# Patient Record
Sex: Male | Born: 1955
Health system: Southern US, Community
[De-identification: ages and names within clinical notes are randomized; demographics above are authoritative.]

## PROBLEM LIST (undated history)

## (undated) ENCOUNTER — Emergency Department (HOSPITAL_BASED_OUTPATIENT_CLINIC_OR_DEPARTMENT_OTHER): Admission: EM | Discharge status: Absent without leave | Payer: Managed Care, Other (non HMO)

## (undated) DIAGNOSIS — N400 Enlarged prostate without lower urinary tract symptoms: Secondary | ICD-10-CM

## (undated) DIAGNOSIS — F329 Major depressive disorder, single episode, unspecified: Secondary | ICD-10-CM

## (undated) DIAGNOSIS — K648 Other hemorrhoids: Secondary | ICD-10-CM

## (undated) DIAGNOSIS — D126 Benign neoplasm of colon, unspecified: Secondary | ICD-10-CM

## (undated) DIAGNOSIS — E785 Hyperlipidemia, unspecified: Secondary | ICD-10-CM

## (undated) DIAGNOSIS — N529 Male erectile dysfunction, unspecified: Secondary | ICD-10-CM

## (undated) DIAGNOSIS — F32A Depression, unspecified: Secondary | ICD-10-CM

## (undated) DIAGNOSIS — I10 Essential (primary) hypertension: Secondary | ICD-10-CM

## (undated) HISTORY — DX: Other hemorrhoids: K64.8

## (undated) HISTORY — PX: APPENDECTOMY: SHX54

## (undated) HISTORY — DX: Major depressive disorder, single episode, unspecified: F32.9

## (undated) HISTORY — PX: INGUINAL HERNIA REPAIR: SHX194

## (undated) HISTORY — PX: ELBOW SURGERY: SHX618

## (undated) HISTORY — DX: Benign neoplasm of colon, unspecified: D12.6

## (undated) HISTORY — DX: Essential (primary) hypertension: I10

## (undated) HISTORY — DX: Hyperlipidemia, unspecified: E78.5

## (undated) HISTORY — DX: Male erectile dysfunction, unspecified: N52.9

## (undated) HISTORY — DX: Benign prostatic hyperplasia without lower urinary tract symptoms: N40.0

## (undated) HISTORY — PX: CARPAL TUNNEL RELEASE: SHX101

## (undated) HISTORY — DX: Depression, unspecified: F32.A

---

## 1997-10-29 ENCOUNTER — Other Ambulatory Visit: Admission: RE | Admit: 1997-10-29 | Discharge: 1997-10-29 | Payer: Self-pay | Admitting: Family Medicine

## 1997-11-12 ENCOUNTER — Other Ambulatory Visit: Admission: RE | Admit: 1997-11-12 | Discharge: 1997-11-12 | Payer: Self-pay | Admitting: Family Medicine

## 1998-04-08 ENCOUNTER — Emergency Department (HOSPITAL_COMMUNITY): Admission: EM | Admit: 1998-04-08 | Discharge: 1998-04-08 | Payer: Self-pay | Admitting: Emergency Medicine

## 1998-09-01 ENCOUNTER — Emergency Department (HOSPITAL_COMMUNITY): Admission: EM | Admit: 1998-09-01 | Discharge: 1998-09-01 | Payer: Self-pay | Admitting: Emergency Medicine

## 1999-02-27 ENCOUNTER — Emergency Department (HOSPITAL_COMMUNITY): Admission: EM | Admit: 1999-02-27 | Discharge: 1999-02-27 | Payer: Self-pay

## 1999-11-30 ENCOUNTER — Emergency Department (HOSPITAL_COMMUNITY): Admission: EM | Admit: 1999-11-30 | Discharge: 1999-11-30 | Payer: Self-pay | Admitting: Emergency Medicine

## 1999-11-30 ENCOUNTER — Encounter: Payer: Self-pay | Admitting: Emergency Medicine

## 2001-12-04 ENCOUNTER — Emergency Department (HOSPITAL_COMMUNITY): Admission: EM | Admit: 2001-12-04 | Discharge: 2001-12-05 | Payer: Self-pay | Admitting: Emergency Medicine

## 2002-07-07 ENCOUNTER — Emergency Department (HOSPITAL_COMMUNITY): Admission: EM | Admit: 2002-07-07 | Discharge: 2002-07-07 | Payer: Self-pay

## 2004-02-04 ENCOUNTER — Emergency Department (HOSPITAL_COMMUNITY): Admission: EM | Admit: 2004-02-04 | Discharge: 2004-02-04 | Payer: Self-pay | Admitting: Emergency Medicine

## 2004-09-17 ENCOUNTER — Emergency Department (HOSPITAL_COMMUNITY): Admission: EM | Admit: 2004-09-17 | Discharge: 2004-09-17 | Payer: Self-pay | Admitting: Emergency Medicine

## 2006-07-20 ENCOUNTER — Ambulatory Visit: Payer: Self-pay | Admitting: Family Medicine

## 2006-08-03 ENCOUNTER — Ambulatory Visit: Payer: Self-pay | Admitting: Family Medicine

## 2006-08-03 LAB — CONVERTED CEMR LAB
Albumin: 4.3 g/dL (ref 3.5–5.2)
Alkaline Phosphatase: 69 units/L (ref 39–117)
Basophils Absolute: 0.1 10*3/uL (ref 0.0–0.1)
CO2: 29 meq/L (ref 19–32)
Creatinine, Ser: 1.4 mg/dL (ref 0.4–1.5)
Eosinophils Absolute: 0.1 10*3/uL (ref 0.0–0.6)
GFR calc Af Amer: 69 mL/min
GFR calc non Af Amer: 57 mL/min
Glucose, Bld: 100 mg/dL — ABNORMAL HIGH (ref 70–99)
HDL: 60.2 mg/dL (ref >39.0)
MCHC: 34.3 g/dL (ref 30.0–36.0)
Monocytes Relative: 6.6 % (ref 3.0–11.0)
Neutro Abs: 3.9 10*3/uL (ref 1.4–7.7)
Neutrophils Relative %: 56.2 % (ref 43.0–77.0)
Platelets: 316 10*3/uL (ref 150–400)
RBC: 4.91 M/uL (ref 4.22–5.81)
RDW: 14.3 % (ref 11.5–14.6)
Sodium: 140 meq/L (ref 135–145)
Total Bilirubin: 1.3 mg/dL — ABNORMAL HIGH (ref 0.3–1.2)
Total Protein: 7.4 g/dL (ref 6.0–8.3)
Triglycerides: 431 mg/dL (ref 0–149)
VLDL: 86 mg/dL — ABNORMAL HIGH (ref 0–40)

## 2006-09-18 ENCOUNTER — Ambulatory Visit: Payer: Self-pay | Admitting: Family Medicine

## 2006-12-21 ENCOUNTER — Ambulatory Visit: Payer: Self-pay | Admitting: Family Medicine

## 2006-12-21 DIAGNOSIS — E785 Hyperlipidemia, unspecified: Secondary | ICD-10-CM | POA: Insufficient documentation

## 2006-12-21 DIAGNOSIS — K644 Residual hemorrhoidal skin tags: Secondary | ICD-10-CM | POA: Insufficient documentation

## 2006-12-26 ENCOUNTER — Encounter: Payer: Self-pay | Admitting: Family Medicine

## 2007-02-21 ENCOUNTER — Telehealth (INDEPENDENT_AMBULATORY_CARE_PROVIDER_SITE_OTHER): Payer: Self-pay | Admitting: *Deleted

## 2007-02-22 ENCOUNTER — Ambulatory Visit: Payer: Self-pay | Admitting: Family Medicine

## 2007-02-22 DIAGNOSIS — F329 Major depressive disorder, single episode, unspecified: Secondary | ICD-10-CM | POA: Insufficient documentation

## 2007-03-09 ENCOUNTER — Telehealth (INDEPENDENT_AMBULATORY_CARE_PROVIDER_SITE_OTHER): Payer: Self-pay | Admitting: *Deleted

## 2007-05-21 ENCOUNTER — Ambulatory Visit: Payer: Self-pay | Admitting: Internal Medicine

## 2007-05-21 DIAGNOSIS — R7989 Other specified abnormal findings of blood chemistry: Secondary | ICD-10-CM | POA: Insufficient documentation

## 2007-05-21 LAB — CONVERTED CEMR LAB: LDL Goal: 160 mg/dL

## 2007-06-06 ENCOUNTER — Telehealth (INDEPENDENT_AMBULATORY_CARE_PROVIDER_SITE_OTHER): Payer: Self-pay | Admitting: *Deleted

## 2007-08-03 ENCOUNTER — Ambulatory Visit: Payer: Self-pay | Admitting: Family Medicine

## 2007-08-03 DIAGNOSIS — F528 Other sexual dysfunction not due to a substance or known physiological condition: Secondary | ICD-10-CM

## 2007-08-06 ENCOUNTER — Encounter: Payer: Self-pay | Admitting: Family Medicine

## 2007-10-05 ENCOUNTER — Telehealth (INDEPENDENT_AMBULATORY_CARE_PROVIDER_SITE_OTHER): Payer: Self-pay | Admitting: *Deleted

## 2007-10-08 ENCOUNTER — Ambulatory Visit: Payer: Self-pay | Admitting: Family Medicine

## 2007-10-08 DIAGNOSIS — M25559 Pain in unspecified hip: Secondary | ICD-10-CM

## 2007-10-09 ENCOUNTER — Telehealth (INDEPENDENT_AMBULATORY_CARE_PROVIDER_SITE_OTHER): Payer: Self-pay | Admitting: *Deleted

## 2007-10-15 ENCOUNTER — Encounter: Admission: RE | Admit: 2007-10-15 | Discharge: 2007-10-15 | Payer: Self-pay | Admitting: Family Medicine

## 2007-10-15 ENCOUNTER — Encounter: Payer: Self-pay | Admitting: Family Medicine

## 2007-10-23 ENCOUNTER — Encounter (INDEPENDENT_AMBULATORY_CARE_PROVIDER_SITE_OTHER): Payer: Self-pay | Admitting: *Deleted

## 2007-10-26 ENCOUNTER — Telehealth (INDEPENDENT_AMBULATORY_CARE_PROVIDER_SITE_OTHER): Payer: Self-pay | Admitting: *Deleted

## 2007-10-27 ENCOUNTER — Encounter: Payer: Self-pay | Admitting: Family Medicine

## 2007-11-09 ENCOUNTER — Telehealth (INDEPENDENT_AMBULATORY_CARE_PROVIDER_SITE_OTHER): Payer: Self-pay | Admitting: *Deleted

## 2007-11-13 ENCOUNTER — Ambulatory Visit: Payer: Self-pay | Admitting: Family Medicine

## 2007-11-19 ENCOUNTER — Ambulatory Visit: Payer: Self-pay | Admitting: Internal Medicine

## 2007-11-19 DIAGNOSIS — J019 Acute sinusitis, unspecified: Secondary | ICD-10-CM

## 2007-11-26 ENCOUNTER — Encounter (INDEPENDENT_AMBULATORY_CARE_PROVIDER_SITE_OTHER): Payer: Self-pay | Admitting: *Deleted

## 2007-11-26 ENCOUNTER — Telehealth (INDEPENDENT_AMBULATORY_CARE_PROVIDER_SITE_OTHER): Payer: Self-pay | Admitting: *Deleted

## 2007-11-29 ENCOUNTER — Ambulatory Visit: Payer: Self-pay | Admitting: Internal Medicine

## 2007-12-18 ENCOUNTER — Encounter: Admission: RE | Admit: 2007-12-18 | Discharge: 2008-03-17 | Payer: Self-pay | Admitting: Family Medicine

## 2008-01-16 ENCOUNTER — Ambulatory Visit: Payer: Self-pay | Admitting: Family Medicine

## 2008-01-16 ENCOUNTER — Telehealth (INDEPENDENT_AMBULATORY_CARE_PROVIDER_SITE_OTHER): Payer: Self-pay | Admitting: *Deleted

## 2008-01-27 LAB — CONVERTED CEMR LAB
Albumin: 4.1 g/dL (ref 3.5–5.2)
BUN: 14 mg/dL (ref 6–23)
Calcium: 9.2 mg/dL (ref 8.4–10.5)
Direct LDL: 65.4 mg/dL
GFR calc Af Amer: 82 mL/min
Glucose, Bld: 94 mg/dL (ref 70–99)
HDL: 55.9 mg/dL (ref >39.0)
Total Protein: 7 g/dL (ref 6.0–8.3)
Triglycerides: 220 mg/dL (ref 0–149)

## 2008-01-28 ENCOUNTER — Encounter (INDEPENDENT_AMBULATORY_CARE_PROVIDER_SITE_OTHER): Payer: Self-pay | Admitting: *Deleted

## 2008-01-30 ENCOUNTER — Encounter: Payer: Self-pay | Admitting: Family Medicine

## 2008-02-04 ENCOUNTER — Encounter: Payer: Self-pay | Admitting: Family Medicine

## 2008-02-11 ENCOUNTER — Ambulatory Visit: Payer: Self-pay | Admitting: Family Medicine

## 2008-02-18 ENCOUNTER — Encounter (INDEPENDENT_AMBULATORY_CARE_PROVIDER_SITE_OTHER): Payer: Self-pay | Admitting: *Deleted

## 2008-02-18 LAB — CONVERTED CEMR LAB
AST: 23 units/L (ref 0–37)
Albumin: 4 g/dL (ref 3.5–5.2)
Alkaline Phosphatase: 36 units/L — ABNORMAL LOW (ref 39–117)
BUN: 13 mg/dL (ref 6–23)
Bilirubin, Direct: 0.1 mg/dL (ref 0.0–0.3)
Chloride: 110 meq/L (ref 96–112)
Cholesterol: 131 mg/dL (ref 0–200)
GFR calc non Af Amer: 62 mL/min
Glucose, Bld: 86 mg/dL (ref 70–99)
LDL Cholesterol: 52 mg/dL (ref 0–99)
Potassium: 3.8 meq/L (ref 3.5–5.1)

## 2008-03-31 ENCOUNTER — Ambulatory Visit: Payer: Self-pay | Admitting: Family Medicine

## 2008-03-31 ENCOUNTER — Encounter (INDEPENDENT_AMBULATORY_CARE_PROVIDER_SITE_OTHER): Payer: Self-pay | Admitting: *Deleted

## 2008-03-31 DIAGNOSIS — I10 Essential (primary) hypertension: Secondary | ICD-10-CM | POA: Insufficient documentation

## 2008-04-03 ENCOUNTER — Encounter (INDEPENDENT_AMBULATORY_CARE_PROVIDER_SITE_OTHER): Payer: Self-pay | Admitting: *Deleted

## 2008-04-15 ENCOUNTER — Ambulatory Visit: Payer: Self-pay | Admitting: Gastroenterology

## 2008-04-17 ENCOUNTER — Ambulatory Visit: Payer: Self-pay | Admitting: Cardiology

## 2008-06-19 ENCOUNTER — Ambulatory Visit: Payer: Self-pay | Admitting: Family Medicine

## 2008-06-26 ENCOUNTER — Telehealth (INDEPENDENT_AMBULATORY_CARE_PROVIDER_SITE_OTHER): Payer: Self-pay | Admitting: *Deleted

## 2008-08-12 ENCOUNTER — Ambulatory Visit: Payer: Self-pay | Admitting: Family Medicine

## 2008-09-30 ENCOUNTER — Ambulatory Visit: Payer: Self-pay | Admitting: Family Medicine

## 2008-10-22 ENCOUNTER — Ambulatory Visit: Payer: Self-pay | Admitting: Gastroenterology

## 2008-11-08 DIAGNOSIS — D126 Benign neoplasm of colon, unspecified: Secondary | ICD-10-CM

## 2008-11-08 HISTORY — DX: Benign neoplasm of colon, unspecified: D12.6

## 2008-11-13 ENCOUNTER — Telehealth (INDEPENDENT_AMBULATORY_CARE_PROVIDER_SITE_OTHER): Payer: Self-pay | Admitting: *Deleted

## 2008-11-17 ENCOUNTER — Ambulatory Visit: Payer: Self-pay | Admitting: Internal Medicine

## 2008-11-17 DIAGNOSIS — E7800 Pure hypercholesterolemia, unspecified: Secondary | ICD-10-CM | POA: Insufficient documentation

## 2008-11-20 LAB — CONVERTED CEMR LAB
ALT: 28 units/L (ref 0–53)
Cholesterol: 146 mg/dL (ref 0–200)
HDL: 66.4 mg/dL (ref >39.00)
Total Protein: 6.7 g/dL (ref 6.0–8.3)
VLDL: 21.6 mg/dL (ref 0.0–40.0)

## 2008-11-21 ENCOUNTER — Telehealth: Payer: Self-pay | Admitting: Gastroenterology

## 2008-11-25 ENCOUNTER — Ambulatory Visit: Payer: Self-pay | Admitting: Gastroenterology

## 2008-11-25 ENCOUNTER — Encounter: Payer: Self-pay | Admitting: Gastroenterology

## 2008-11-27 ENCOUNTER — Ambulatory Visit: Payer: Self-pay | Admitting: Internal Medicine

## 2008-11-27 ENCOUNTER — Encounter: Payer: Self-pay | Admitting: Gastroenterology

## 2008-12-04 ENCOUNTER — Ambulatory Visit: Payer: Self-pay | Admitting: Family Medicine

## 2008-12-04 ENCOUNTER — Encounter (INDEPENDENT_AMBULATORY_CARE_PROVIDER_SITE_OTHER): Payer: Self-pay | Admitting: *Deleted

## 2008-12-04 DIAGNOSIS — Z87898 Personal history of other specified conditions: Secondary | ICD-10-CM

## 2008-12-04 LAB — CONVERTED CEMR LAB
Bilirubin Urine: NEGATIVE
Glucose, Urine, Semiquant: NEGATIVE
PSA: 0.65 ng/mL (ref 0.10–4.00)
Urobilinogen, UA: NEGATIVE
pH: 6.5

## 2009-01-07 ENCOUNTER — Telehealth (INDEPENDENT_AMBULATORY_CARE_PROVIDER_SITE_OTHER): Payer: Self-pay | Admitting: *Deleted

## 2009-02-04 ENCOUNTER — Telehealth (INDEPENDENT_AMBULATORY_CARE_PROVIDER_SITE_OTHER): Payer: Self-pay | Admitting: *Deleted

## 2009-02-09 ENCOUNTER — Emergency Department (HOSPITAL_BASED_OUTPATIENT_CLINIC_OR_DEPARTMENT_OTHER): Admission: EM | Admit: 2009-02-09 | Discharge: 2009-02-10 | Payer: Self-pay | Admitting: Emergency Medicine

## 2009-02-10 ENCOUNTER — Ambulatory Visit: Payer: Self-pay | Admitting: Family Medicine

## 2009-02-10 DIAGNOSIS — R5381 Other malaise: Secondary | ICD-10-CM

## 2009-02-10 DIAGNOSIS — R5383 Other fatigue: Secondary | ICD-10-CM

## 2009-02-10 DIAGNOSIS — J301 Allergic rhinitis due to pollen: Secondary | ICD-10-CM | POA: Insufficient documentation

## 2009-02-11 ENCOUNTER — Encounter (INDEPENDENT_AMBULATORY_CARE_PROVIDER_SITE_OTHER): Payer: Self-pay | Admitting: *Deleted

## 2009-03-20 ENCOUNTER — Encounter: Payer: Self-pay | Admitting: Family Medicine

## 2009-04-06 ENCOUNTER — Ambulatory Visit: Payer: Self-pay | Admitting: Family Medicine

## 2009-04-06 DIAGNOSIS — R259 Unspecified abnormal involuntary movements: Secondary | ICD-10-CM | POA: Insufficient documentation

## 2009-04-13 ENCOUNTER — Encounter (INDEPENDENT_AMBULATORY_CARE_PROVIDER_SITE_OTHER): Payer: Self-pay | Admitting: *Deleted

## 2009-06-15 ENCOUNTER — Ambulatory Visit: Payer: Self-pay | Admitting: Family Medicine

## 2009-07-25 ENCOUNTER — Ambulatory Visit: Payer: Self-pay | Admitting: Diagnostic Radiology

## 2009-07-25 ENCOUNTER — Emergency Department (HOSPITAL_BASED_OUTPATIENT_CLINIC_OR_DEPARTMENT_OTHER): Admission: EM | Admit: 2009-07-25 | Discharge: 2009-07-25 | Payer: Self-pay | Admitting: Emergency Medicine

## 2009-08-27 ENCOUNTER — Ambulatory Visit: Payer: Self-pay | Admitting: Internal Medicine

## 2009-08-27 DIAGNOSIS — R61 Generalized hyperhidrosis: Secondary | ICD-10-CM

## 2009-08-27 DIAGNOSIS — I951 Orthostatic hypotension: Secondary | ICD-10-CM

## 2009-11-26 ENCOUNTER — Ambulatory Visit: Payer: Self-pay | Admitting: Family Medicine

## 2009-12-01 ENCOUNTER — Ambulatory Visit: Payer: Self-pay | Admitting: Diagnostic Radiology

## 2009-12-01 ENCOUNTER — Emergency Department (HOSPITAL_BASED_OUTPATIENT_CLINIC_OR_DEPARTMENT_OTHER): Admission: EM | Admit: 2009-12-01 | Discharge: 2009-12-01 | Payer: Self-pay | Admitting: Emergency Medicine

## 2010-01-05 ENCOUNTER — Emergency Department (HOSPITAL_BASED_OUTPATIENT_CLINIC_OR_DEPARTMENT_OTHER): Admission: EM | Admit: 2010-01-05 | Discharge: 2010-01-05 | Payer: Self-pay | Admitting: Emergency Medicine

## 2010-01-06 ENCOUNTER — Ambulatory Visit: Payer: Self-pay | Admitting: Family Medicine

## 2010-01-25 ENCOUNTER — Ambulatory Visit: Payer: Self-pay | Admitting: Family Medicine

## 2010-01-25 ENCOUNTER — Ambulatory Visit (HOSPITAL_BASED_OUTPATIENT_CLINIC_OR_DEPARTMENT_OTHER): Admission: RE | Admit: 2010-01-25 | Discharge: 2010-01-25 | Payer: Self-pay | Admitting: Family Medicine

## 2010-01-25 ENCOUNTER — Ambulatory Visit: Payer: Self-pay | Admitting: Diagnostic Radiology

## 2010-01-25 DIAGNOSIS — R079 Chest pain, unspecified: Secondary | ICD-10-CM | POA: Insufficient documentation

## 2010-01-25 DIAGNOSIS — R05 Cough: Secondary | ICD-10-CM | POA: Insufficient documentation

## 2010-01-25 DIAGNOSIS — R059 Cough, unspecified: Secondary | ICD-10-CM | POA: Insufficient documentation

## 2010-02-03 ENCOUNTER — Ambulatory Visit: Payer: Self-pay | Admitting: Family Medicine

## 2010-02-09 ENCOUNTER — Telehealth: Payer: Self-pay | Admitting: Family Medicine

## 2010-02-11 ENCOUNTER — Telehealth: Payer: Self-pay | Admitting: Family Medicine

## 2010-02-11 LAB — CONVERTED CEMR LAB
Alkaline Phosphatase: 36 units/L — ABNORMAL LOW (ref 39–117)
Bilirubin, Direct: 0.1 mg/dL (ref 0.0–0.3)
Total Protein: 7.2 g/dL (ref 6.0–8.3)
VLDL: 55.6 mg/dL — ABNORMAL HIGH (ref 0.0–40.0)

## 2010-02-15 ENCOUNTER — Ambulatory Visit: Payer: Self-pay | Admitting: Cardiovascular Disease

## 2010-04-16 ENCOUNTER — Ambulatory Visit: Payer: Self-pay | Admitting: Family Medicine

## 2010-05-07 ENCOUNTER — Ambulatory Visit: Payer: Self-pay | Admitting: Family Medicine

## 2010-05-07 LAB — CONVERTED CEMR LAB: Influenza B Ag: NEGATIVE

## 2010-07-02 ENCOUNTER — Ambulatory Visit: Payer: Self-pay | Admitting: Family Medicine

## 2010-07-13 ENCOUNTER — Ambulatory Visit
Admission: RE | Admit: 2010-07-13 | Discharge: 2010-07-13 | Payer: Self-pay | Source: Home / Self Care | Attending: Family Medicine | Admitting: Family Medicine

## 2010-07-19 ENCOUNTER — Ambulatory Visit
Admission: RE | Admit: 2010-07-19 | Discharge: 2010-07-19 | Payer: Self-pay | Source: Home / Self Care | Attending: Family Medicine | Admitting: Family Medicine

## 2010-08-08 LAB — CONVERTED CEMR LAB
ALT: 32 units/L (ref 0–53)
AST: 23 units/L (ref 0–37)
Albumin: 3.9 g/dL (ref 3.5–5.2)
Alkaline Phosphatase: 40 units/L (ref 39–117)
Alkaline Phosphatase: 59 units/L (ref 39–117)
Basophils Absolute: 0 10*3/uL (ref 0.0–0.1)
Bilirubin, Direct: 0.1 mg/dL (ref 0.0–0.3)
Bilirubin, Direct: 0.1 mg/dL (ref 0.0–0.3)
Bilirubin, Direct: 0.1 mg/dL (ref 0.0–0.3)
CO2: 27 meq/L (ref 19–32)
Chloride: 106 meq/L (ref 96–112)
Creatinine, Ser: 1.2 mg/dL (ref 0.4–1.5)
GFR calc non Af Amer: 68 mL/min
Glucose, Bld: 104 mg/dL — ABNORMAL HIGH (ref 70–99)
Glucose, Bld: 86 mg/dL (ref 70–99)
HDL: 58.5 mg/dL (ref >39.0)
Ketones, urine, test strip: NEGATIVE
LDL Cholesterol: 74 mg/dL (ref 0–99)
Lymphocytes Relative: 42 % (ref 12.0–46.0)
MCHC: 34.2 g/dL (ref 30.0–36.0)
Monocytes Relative: 8.9 % (ref 3.0–12.0)
Neutrophils Relative %: 46.5 % (ref 43.0–77.0)
Nitrite: NEGATIVE
PSA: 0.76 ng/mL (ref 0.10–4.00)
Platelets: 285 10*3/uL (ref 150–400)
Potassium: 3.5 meq/L (ref 3.5–5.1)
Protein, U semiquant: NEGATIVE
RDW: 13.9 % (ref 11.5–14.6)
Sodium: 141 meq/L (ref 135–145)
Sodium: 142 meq/L (ref 135–145)
TSH: 1.91 microintl units/mL (ref 0.35–5.50)
Total Bilirubin: 0.8 mg/dL (ref 0.3–1.2)
Total Bilirubin: 0.9 mg/dL (ref 0.3–1.2)
Total Bilirubin: 1.3 mg/dL — ABNORMAL HIGH (ref 0.3–1.2)
Total CHOL/HDL Ratio: 2.5
Total Protein: 6.8 g/dL (ref 6.0–8.3)
Total Protein: 7.1 g/dL (ref 6.0–8.3)
Total Protein: 7.2 g/dL (ref 6.0–8.3)
Triglycerides: 73 mg/dL (ref 0–149)
Urobilinogen, UA: NEGATIVE
VLDL: 15 mg/dL (ref 0–40)

## 2010-08-10 NOTE — Assessment & Plan Note (Signed)
Summary: FOR SINUS//PH   Vital Signs:  Patient profile:   55 year old male Weight:      173.8 pounds Temp:     97.2 degrees F oral Pulse rate:   76 / minute Pulse rhythm:   regular BP sitting:   140 / 88  (left arm) Cuff size:   regular  Vitals Entered By: Almeta Monas CMA Duncan Dull) (April 16, 2010 3:33 PM) CC: c/o severe sinus Headache, low grade fever, post nasal drainage and congestion, URI symptoms   History of Present Illness:       This is a 55 year old man who presents with URI symptoms.  The symptoms began 1 week ago.  Pt using otc meds with no relief.   + b/l sinus headache and pressure.  The patient complains of nasal congestion, purulent nasal discharge, earache, and sick contacts, but denies dry cough.  The patient denies fever, low-grade fever (<100.5 degrees), fever of 100.5-103 degrees, fever of 103.1-104 degrees, fever to >104 degrees, stiff neck, dyspnea, wheezing, rash, vomiting, diarrhea, use of an antipyretic, and response to antipyretic.  The patient also reports headache.  The patient denies itchy watery eyes, itchy throat, sneezing, seasonal symptoms, response to antihistamine, muscle aches, and severe fatigue.  The patient denies the following risk factors for Strep sinusitis: unilateral facial pain, unilateral nasal discharge, poor response to decongestant, double sickening, tooth pain, Strep exposure, tender adenopathy, and absence of cough.    Preventive Screening-Counseling & Management  Alcohol-Tobacco     Alcohol drinks/day: 2     Alcohol type: beer     Smoking Status: never     Passive Smoke Exposure: no  Caffeine-Diet-Exercise     Caffeine use/day: 1     Does Patient Exercise: no  Current Medications (verified): 1)  Viagra 100 Mg Tabs (Sildenafil Citrate) .... As Directed 2)  Crestor 20 Mg Tabs (Rosuvastatin Calcium) .Marland Kitchen.. 1 By Mouth Once Daily 3)  Prinzide 20-12.5 Mg Tabs (Lisinopril-Hydrochlorothiazide) .Marland Kitchen.. 1 By Mouth Once Daily. 4)  Claritin 10  Mg Tabs (Loratadine) .Marland Kitchen.. 1 By Mouth Once Daily As Needed Allergies 5)  Fenofibrate 160 Mg Tabs (Fenofibrate) .... Take One Tablet By Mouth Daily With A Meal 6)  Biaxin Xl Pac 500 Mg Xr24h-Tab (Clarithromycin) .... 2 By Mouth Once Daily 7)  Veramyst 27.5 Mcg/spray Susp (Fluticasone Furoate) .... 2 Sprays Each Nostril Once Daily  Allergies (verified): 1)  ! Pcn  Past History:  Past Medical History: Last updated: 03/31/2008 Depression Hyperlipidemia Erectile Dysfunction Hypertension  Past Surgical History: Last updated: 03/31/2008 Appendectomy Inguinal herniorrhaphy elbow  sugery  wrist surgery  Family History: Last updated: 04/06/2009 Family History of CAD Male 1st degree relative 56yo Family History of CAD Male 1st degree relative 56yo F--alz dementia Family H parkinsons   Social History: Last updated: 11/27/2008 Married Retired Never Smoked Alcohol use-yes Regular exercise-no  Risk Factors: Alcohol Use: 2 (04/16/2010) Caffeine Use: 1 (04/16/2010) Exercise: no (04/16/2010)  Risk Factors: Smoking Status: never (04/16/2010) Passive Smoke Exposure: no (04/16/2010)  Family History: Reviewed history from 04/06/2009 and no changes required. Family History of CAD Male 1st degree relative 56yo Family History of CAD Male 1st degree relative 56yo F--alz dementia Family H parkinsons   Social History: Reviewed history from 11/27/2008 and no changes required. Married Retired Never Smoked Alcohol use-yes Regular exercise-no  Review of Systems      See HPI  Physical Exam  General:  Well-developed,well-nourished,in no acute distress; alert,appropriate and cooperative throughout examination Ears:  External ear exam shows no significant lesions or deformities.  Otoscopic examination reveals clear canals, tympanic membranes are intact bilaterally without bulging, retraction, inflammation or discharge. Hearing is grossly normal bilaterally. Nose:  L frontal sinus  tenderness, L maxillary sinus tenderness, R frontal sinus tenderness, and R maxillary sinus tenderness.   Mouth:  pharyngeal erythema and postnasal drip.   Neck:  supple and cervical lymphadenopathy.   Lungs:  Normal respiratory effort, chest expands symmetrically. Lungs are clear to auscultation, no crackles or wheezes. Heart:  Normal rate and regular rhythm. S1 and S2 normal without gallop, murmur, click, rub or other extra sounds. Skin:  Intact without suspicious lesions or rashes Psych:  Cognition and judgment appear intact. Alert and cooperative with normal attention span and concentration. No apparent delusions, illusions, hallucinations   Impression & Recommendations:  Problem # 1:  SINUSITIS - ACUTE-NOS (ICD-461.9)  Instructed on treatment. Call if symptoms persist or worsen.   His updated medication list for this problem includes:    Biaxin Xl Pac 500 Mg Xr24h-tab (Clarithromycin) .Marland Kitchen... 2 by mouth once daily    Veramyst 27.5 Mcg/spray Susp (Fluticasone furoate) .Marland Kitchen... 2 sprays each nostril once daily  Complete Medication List: 1)  Viagra 100 Mg Tabs (Sildenafil citrate) .... As directed 2)  Crestor 20 Mg Tabs (Rosuvastatin calcium) .Marland Kitchen.. 1 by mouth once daily 3)  Prinzide 20-12.5 Mg Tabs (Lisinopril-hydrochlorothiazide) .Marland Kitchen.. 1 by mouth once daily. 4)  Claritin 10 Mg Tabs (Loratadine) .Marland Kitchen.. 1 by mouth once daily as needed allergies 5)  Fenofibrate 160 Mg Tabs (Fenofibrate) .... Take one tablet by mouth daily with a meal 6)  Biaxin Xl Pac 500 Mg Xr24h-tab (Clarithromycin) .... 2 by mouth once daily 7)  Veramyst 27.5 Mcg/spray Susp (Fluticasone furoate) .... 2 sprays each nostril once daily  Patient Instructions: 1)  Acute sinusitis symptoms for less than 10 days are not helped by antibiotics. Use warm moist compresses, and over the counter decongestants( only as directed). Call if no improvement in 5-7 days, sooner if increasing pain, fever, or new symptoms.   Prescriptions: VERAMYST 27.5 MCG/SPRAY SUSP (FLUTICASONE FUROATE) 2 sprays each nostril once daily  #1 x 0   Entered and Authorized by:   Loreen Freud DO   Signed by:   Loreen Freud DO on 04/16/2010   Method used:   Historical   RxID:   [id number] BIAXIN XL PAC 500 MG XR24H-TAB (CLARITHROMYCIN) 2 by mouth once daily  #28 x 0   Entered and Authorized by:   Loreen Freud DO   Signed by:   Loreen Freud DO on 04/16/2010   Method used:   Electronically to        [facility] [address] (retail)       [phone]       [place], [place]  [address]       Ph: [phone]       Fax: [phone]   RxID:   [id number]

## 2010-08-10 NOTE — Assessment & Plan Note (Signed)
Summary: tightness in chest when coughing/cbs   Vital Signs:  Patient profile:   55 year old male Height:      71.5 inches Weight:      187 pounds O2 Sat:      99 % on Room air Temp:     98.3 degrees F oral Pulse rate:   65 / minute BP sitting:   120 / 80  (left arm)  Vitals Entered By: Jeremy Johann CMA (January 25, 2010 1:01 PM)  O2 Flow:  Room air CC: tighness in chest when coughing, Cough   History of Present Illness:       This is a 55 year old man who presents with Chest Pain.  The symptoms began 2 days ago.  Chest pain only with coughing.  The patient reports exertional chest pain, but denies resting chest pain, nausea, vomiting, diaphoresis, shortness of breath, palpitations, dizziness, light headedness, syncope, and indigestion.  The pain is described as intermittent and band-like.  The pain is located in the substernal area.  The pain is brought on or made worse by coughing.    Cough      The patient also presents with Cough.  The symptoms began 3 days ago.  The patient reports non-productive cough, but denies productive cough, pleuritic chest pain, shortness of breath, wheezing, exertional dyspnea, fever, hemoptysis, and malaise.  Associated symtpoms include cold/URI symptoms and chronic rhinitis.  The patient denies the following symptoms: sore throat, nasal congestion, weight loss, acid reflux symptoms, and peripheral edema.    Anticoagulation Management History:      Negative risk factors for bleeding include an age less than 46 years old and no history of CVA/TIA.  The bleeding index is 'low risk'.  Positive CHADS2 values include History of HTN.  Negative CHADS2 values include Age > 36 years old, History of Diabetes, and Prior Stroke/CVA/TIA.     Current Medications (verified): 1)  Viagra 100 Mg Tabs (Sildenafil Citrate) .... As Directed 2)  Trilipix 135 Mg Cpdr (Choline Fenofibrate) .Marland Kitchen.. 1 By Mouth Once Daily 3)  Crestor 20 Mg Tabs (Rosuvastatin Calcium) .Marland Kitchen.. 1 By  Mouth Once Daily 4)  Prinzide 20-12.5 Mg Tabs (Lisinopril-Hydrochlorothiazide) .Marland Kitchen.. 1 By Mouth Once Daily. 5)  Zithromax Z-Pak 250 Mg Tabs (Azithromycin) .... As Directed 6)  Claritin 10 Mg Tabs (Loratadine) .Marland Kitchen.. 1 By Mouth Once Daily As Needed Allergies  Allergies (verified): 1)  ! Pcn  Past History:  Past medical, surgical, family and social histories (including risk factors) reviewed for relevance to current acute and chronic problems.  Past Medical History: Reviewed history from 03/31/2008 and no changes required. Depression Hyperlipidemia Erectile Dysfunction Hypertension  Past Surgical History: Reviewed history from 03/31/2008 and no changes required. Appendectomy Inguinal herniorrhaphy elbow  sugery  wrist surgery  Family History: Reviewed history from 04/06/2009 and no changes required. Family History of CAD Male 1st degree relative 56yo Family History of CAD Male 1st degree relative 56yo F--alz dementia Family H parkinsons   Social History: Reviewed history from 11/27/2008 and no changes required. Married Retired Never Smoked Alcohol use-yes Regular exercise-no  Review of Systems      See HPI  Physical Exam  General:  Well-developed,well-nourished,in no acute distress; alert,appropriate and cooperative throughout examination Ears:  External ear exam shows no significant lesions or deformities.  Otoscopic examination reveals clear canals, tympanic membranes are intact bilaterally without bulging, retraction, inflammation or discharge. Hearing is grossly normal bilaterally. Nose:  External nasal examination shows no deformity or inflammation.  Nasal mucosa are pink and moist without lesions or exudates. Mouth:  Oral mucosa and oropharynx without lesions or exudates.  Teeth in good repair. Neck:  No deformities, masses, or tenderness noted. Lungs:  Normal respiratory effort, chest expands symmetrically. Lungs are clear to auscultation, no crackles or  wheezes. Heart:  normal rate and no murmur.   Psych:  Cognition and judgment appear intact. Alert and cooperative with normal attention span and concentration. No apparent delusions, illusions, hallucinations   Impression & Recommendations:  Problem # 1:  BRONCHITIS- ACUTE (ICD-466.0)  His updated medication list for this problem includes:    Zithromax Z-pak 250 Mg Tabs (Azithromycin) .Marland Kitchen... As directed  Take antibiotics and other medications as directed. Encouraged to push clear liquids, get enough rest, and take acetaminophen as needed. To be seen in 5-7 days if no improvement, sooner if worse.  Complete Medication List: 1)  Viagra 100 Mg Tabs (Sildenafil citrate) .... As directed 2)  Trilipix 135 Mg Cpdr (Choline fenofibrate) .Marland Kitchen.. 1 by mouth once daily 3)  Crestor 20 Mg Tabs (Rosuvastatin calcium) .Marland Kitchen.. 1 by mouth once daily 4)  Prinzide 20-12.5 Mg Tabs (Lisinopril-hydrochlorothiazide) .Marland Kitchen.. 1 by mouth once daily. 5)  Zithromax Z-pak 250 Mg Tabs (Azithromycin) .... As directed 6)  Claritin 10 Mg Tabs (Loratadine) .Marland Kitchen.. 1 by mouth once daily as needed allergies  Other Orders: EKG w/ Interpretation (93000) T-2 View CXR (71020TC)  Anticoagulation Management Assessment/Plan:            Prescriptions: CLARITIN 10 MG TABS (LORATADINE) 1 by mouth once daily as needed allergies  #30 x 11   Entered and Authorized by:   Loreen Freud DO   Signed by:   Loreen Freud DO on 01/25/2010   Method used:   Electronically to        [facility] [address] (retail)       [phone]       [place], [place]  [address]       Ph: [phone]       Fax: [phone]   RxID:   [id number] ZITHROMAX Z-PAK 250 MG TABS (AZITHROMYCIN) as directed  #1 x 0   Entered and Authorized by:   Loreen Freud DO   Signed by:   Loreen Freud DO on 01/25/2010   Method used:   Electronically to        [facility] [address] (retail)       [phone]        [place], [place]  [address]       Ph: [phone]       Fax: [phone]   RxID:   [id number] CLARITIN 10 MG TABS (LORATADINE) 1 by mouth once daily as needed allergies  #30 x 11   Entered and Authorized by:   Loreen Freud DO   Signed by:   Loreen Freud DO on 01/25/2010   Method used:   Electronically to        [facility] [phone][facility] (retail)       [address]       [place], [place]  [address]       Ph: [phone]       Fax: [phone]   RxID:   [id number] ZITHROMAX Z-PAK 250 MG TABS (AZITHROMYCIN) as directed  #1 x 0   Entered and Authorized by:  Loreen Freud DO   Signed by:   Loreen Freud DO on 01/25/2010   Method used:   [facility] to        [facility] [phone]* (retail)       [address]       [place], [place]  [address]       Ph: [phone]       Fax: [phone]   RxID:   [id number]    EKG  Procedure date:  01/25/2010  Findings:      Normal sinus rhythm with rate of:  61 bpm

## 2010-08-10 NOTE — Progress Notes (Signed)
Summary: Antara pharmacy correction  Phone Note Call from Patient   Summary of Call: [facility]ara was sent to wrong pharmacy, per patient request other pharmacies were removed. He only uses [facility] [address]. Initial call taken by: Lucious Groves CMA,  February 11, 2010 10:17 AM    Prescriptions: Lestine Mount 130 MG CAPS (FENOFIBRATE MICRONIZED) 1 by mouth qd  #30 x 2   Entered by:   Lucious Groves CMA   Authorized by:   Loreen Freud DO   Signed by:   Lucious Groves CMA on 02/11/2010   Method used:   Electronically to        [facility] [address] (retail)       [phone]       [place], [place]  [address]       Ph: [phone]       Fax: [phone]   RxID:   [id number]

## 2010-08-10 NOTE — Assessment & Plan Note (Signed)
Summary: STOMACH CRAMPS//KN   Vital Signs:  Patient profile:   55 year old male Weight:      188 pounds Temp:     97.8 degrees F oral BP sitting:   112 / 72  (left arm)  Vitals Entered By: Doristine Devoid (January 06, 2010 2:34 PM) CC: diarrhea and stomach x2 days    History of Present Illness: 55 yo man here today for diarrhea and abd cramping x2 days.  subjective fevers- alternating cold/hot chills.  stools are watery, yellow.  no blood.  no vomiting.  decreased appetite, drinking plenty of fluids.  taking immodium w/ some relief.  sxs are improved today.  only 2 stools today, yesterday was much more frequent.  no known sick contacts.  Current Medications (verified): 1)  Viagra 100 Mg Tabs (Sildenafil Citrate) .... As Directed 2)  Trilipix 135 Mg Cpdr (Choline Fenofibrate) .Marland Kitchen.. 1 By Mouth Once Daily 3)  Crestor 20 Mg Tabs (Rosuvastatin Calcium) .Marland Kitchen.. 1 By Mouth Once Daily 4)  Prinzide 20-12.5 Mg Tabs (Lisinopril-Hydrochlorothiazide) .Marland Kitchen.. 1 By Mouth Once Daily.  Allergies (verified): 1)  ! Pcn  Past History:  Past Medical History: Last updated: 03/31/2008 Depression Hyperlipidemia Erectile Dysfunction Hypertension  Review of Systems      See HPI  Physical Exam  General:  Well-developed,well-nourished,in no acute distress; alert,appropriate and cooperative throughout examination Mouth:  MMM Lungs:  Normal respiratory effort, chest expands symmetrically. Lungs are clear to auscultation, no crackles or wheezes. Heart:  Normal rate and regular rhythm. S1 and S2 normal without gallop, murmur, click, rub or other extra sounds. Abdomen:  soft, NT/ND, +BS, no rebound/guarding   Impression & Recommendations:  Problem # 1:  DIARRHEA (ICD-787.91) Assessment New likely viral given improvement in sxs.  no red flags on hx or PE.  continue immodium as needed.  reviewed supportive care and red flags that should prompt return.  Pt expresses understanding and is in agreement w/ this  plan.  Complete Medication List: 1)  Viagra 100 Mg Tabs (Sildenafil citrate) .... As directed 2)  Trilipix 135 Mg Cpdr (Choline fenofibrate) .Marland Kitchen.. 1 by mouth once daily 3)  Crestor 20 Mg Tabs (Rosuvastatin calcium) .Marland Kitchen.. 1 by mouth once daily 4)  Prinzide 20-12.5 Mg Tabs (Lisinopril-hydrochlorothiazide) .Marland Kitchen.. 1 by mouth once daily.  Patient Instructions: 1)  This is likely viral and should improve w/ time 2)  Continue to drink plenty of fluids 3)  Immodium as needed for diarrhea 4)  Advance diet as tolerated 5)  Call with any questions or concerns 6)  Hang in there!!

## 2010-08-10 NOTE — Progress Notes (Signed)
Summary: Lestine Mount rx  Phone Note Outgoing Call   Summary of Call: Antara prescription. Patient already aware.  Initial call taken by: Lucious Groves CMA,  February 09, 2010 11:10 AM    New/Updated Medications: ANTARA 130 MG CAPS (FENOFIBRATE MICRONIZED) 1 by mouth qd Prescriptions: ANTARA 130 MG CAPS (FENOFIBRATE MICRONIZED) 1 by mouth qd  #30 x 2   Entered by:   Lucious Groves CMA   Authorized by:   Loreen Freud DO   Signed by:   Lucious Groves CMA on 02/09/2010   Method used:   [facility]ron[facility] to        [facility] [phone]* (retail)       [address]       [place], [place]  [address]       Ph: [phone]       Fax: [phone]   RxID:   [id number]

## 2010-08-10 NOTE — Assessment & Plan Note (Signed)
Summary: FLU SYMPTOMS/KN   Vital Signs:  Patient profile:   55 year old male Weight:      192 pounds Temp:     97.2 degrees F oral BP sitting:   124 / 80  (left arm)  Vitals Entered By: Doristine Devoid CMA (May 07, 2010 4:29 PM) CC: sinus pressure and flu like sx    History of Present Illness: 55 yo man here today for sinus pressure.  'i feel terrible'.  had sinus infxn 10/7.  sxs improved w/ abx.  few days ago developed muscle aches, fatigue, night sweats.  subjective fevers.  having some sinus congestion at night.  denies facial pain or pressure but teeth have been hurting.  no ear pain, sore throat.  mild cough.  denies abd pain, N/V/D.  hx of recurrent sinus infxn.  Current Medications (verified): 1)  Viagra 100 Mg Tabs (Sildenafil Citrate) .... As Directed 2)  Crestor 20 Mg Tabs (Rosuvastatin Calcium) .Marland Kitchen.. 1 By Mouth Once Daily 3)  Prinzide 20-12.5 Mg Tabs (Lisinopril-Hydrochlorothiazide) .Marland Kitchen.. 1 By Mouth Once Daily. 4)  Claritin 10 Mg Tabs (Loratadine) .Marland Kitchen.. 1 By Mouth Once Daily As Needed Allergies 5)  Fenofibrate 160 Mg Tabs (Fenofibrate) .... Take One Tablet By Mouth Daily With A Meal 6)  Veramyst 27.5 Mcg/spray Susp (Fluticasone Furoate) .... 2 Sprays Each Nostril Once Daily  Allergies (verified): 1)  ! Pcn  Past History:  Past Medical History: Depression Hyperlipidemia Erectile Dysfunction Hypertension recurrent sinus infxns  Review of Systems      See HPI  Physical Exam  General:  Well-developed,well-nourished,in no acute distress; alert,appropriate and cooperative throughout examination Head:  NCAT, + TTP over frontal and maxillary sinuses Eyes:  no injxn or inflammation Ears:  External ear exam shows no significant lesions or deformities.  Otoscopic examination reveals clear canals, tympanic membranes are intact bilaterally without bulging, retraction, inflammation or discharge. Hearing is grossly normal bilaterally. Nose:  + congestion Mouth:  pharyngeal  erythema and postnasal drip.   Neck:  supple and cervical lymphadenopathy.   Lungs:  Normal respiratory effort, chest expands symmetrically. Lungs are clear to auscultation, no crackles or wheezes. Heart:  Normal rate and regular rhythm. S1 and S2 normal without gallop, murmur, click, rub or other extra sounds.   Impression & Recommendations:  Problem # 1:  SINUSITIS - ACUTE-NOS (ICD-461.9) Assessment Unchanged pt w/ recurrent sinus infxn.  given recent use of macrolide abx will switch to Doxy (PCN allergic).  reviewed supportive care and red flags that should prompt return.  Pt expresses understanding and is in agreement w/ this plan. His updated medication list for this problem includes:    Veramyst 27.5 Mcg/spray Susp (Fluticasone furoate) .Marland Kitchen... 2 sprays each nostril once daily    Doxycycline Hyclate 100 Mg Caps (Doxycycline hyclate) .Marland Kitchen... Take 1 tab twice a day.  take w/ food.  Complete Medication List: 1)  Viagra 100 Mg Tabs (Sildenafil citrate) .... As directed 2)  Crestor 20 Mg Tabs (Rosuvastatin calcium) .Marland Kitchen.. 1 by mouth once daily 3)  Prinzide 20-12.5 Mg Tabs (Lisinopril-hydrochlorothiazide) .Marland Kitchen.. 1 by mouth once daily. 4)  Claritin 10 Mg Tabs (Loratadine) .Marland Kitchen.. 1 by mouth once daily as needed allergies 5)  Fenofibrate 160 Mg Tabs (Fenofibrate) .... Take one tablet by mouth daily with a meal 6)  Veramyst 27.5 Mcg/spray Susp (Fluticasone furoate) .... 2 sprays each nostril once daily 7)  Doxycycline Hyclate 100 Mg Caps (Doxycycline hyclate) .... Take 1 tab twice a day.  take w/ food.  Other Orders: Flu A+B (42595)  Patient Instructions: 1)  This is likely a recurrent sinus infection 2)  Take the Doxycycline as directed- take w/ food to avoid upset stomach 3)  Drink plenty of fluids 4)  Continue the Claritin 5)  Add mucinex to thin your congestion 6)  Hang in there! 7)  Try and have a good weekend! Prescriptions: DOXYCYCLINE HYCLATE 100 MG CAPS (DOXYCYCLINE HYCLATE) Take 1 tab  twice a day.  take w/ food.  #20 x 0   Entered and Authorized by:   Neena Rhymes MD   Signed by:   Neena Rhymes MD on 05/07/2010   Method used:   Electronically to        [facility] [address] (retail)       [phone]       [place], [place]  [address]       Ph: [phone]       Fax: [phone]   RxID:   [id number]    Orders Added: 1)  Flu A+B [87400] 2)  Est. Patient Level III [62376]    Laboratory Results    Other Tests  Influenza A: negative Influenza B: negative  Kit Test Internal QC: Positive   (Normal Range: Negative)

## 2010-08-10 NOTE — Assessment & Plan Note (Signed)
Summary: high bp//165/100//ldizzinees//lch   Vital Signs:  Patient profile:   55 year old male Height:      71.5 inches Weight:      192 pounds BMI:     26.50 Pulse rate:   86 / minute Resp:     15 per minute BP sitting:   140 / 80  (left arm) Cuff size:   regular  Vitals Entered By: Dena Billet CC: dizzy spells x 1wk Comments Rechecked B/P 134/72   Primary Care Provider:  Laury Axon  CC:  dizzy spells x 1wk.  History of Present Illness:  Intermittent dizziness after sitting up from supine position X 1 week . No BPV ; no true vertigo. No cardiac or neuro trigger. Occasional intention tremor; his father had Parkinson's. BP @ not monitored @ home.Night sweats intermittently for 1 yr, but more frequent. PMH of depression; he has gone off Zoloft.   Allergies: 1)  ! Pcn  Review of Systems General:  Complains of sweats; denies chills, fever, and weight loss. ENT:  Denies difficulty swallowing and hoarseness. CV:  Complains of lightheadness and near fainting; denies chest pain or discomfort, leg cramps with exertion, palpitations, shortness of breath with exertion, swelling of feet, and swelling of hands. Resp:  Denies cough, shortness of breath, and sputum productive. GI:  Denies diarrhea. Derm:  Denies changes in nail beds and dryness. Neuro:  Complains of tremors; denies brief paralysis, numbness, sensation of room spinning, tingling, and weakness. Psych:  Complains of anxiety, depression, easily angered, easily tearful, and irritability. Endo:  Complains of heat intolerance; denies cold intolerance.  Physical Exam  General:  well-nourished,in no acute distress; alert,appropriate and cooperative throughout examination Eyes:  No corneal or conjunctival inflammation noted. EOMI. Perrla. Field of Vision grossly normal. Neck:  No deformities, masses, or tenderness noted. Lungs:  Normal respiratory effort, chest expands symmetrically. Lungs are clear to auscultation, no crackles or  wheezes. Heart:  Normal rate and regular rhythm. S1 and S2 normal without gallop, murmur, click, rub . S4 Pulses:  R and L carotid,radial,dorsalis pedis and posterior tibial pulses are full and equal bilaterally Extremities:  No clubbing, cyanosis, edema. Neurologic:  alert & oriented X3, strength normal in all extremities, sensation intact to light touch, and DTRs symmetrical and normal.   Skin:  Intact without suspicious lesions or rashes Cervical Nodes:  No lymphadenopathy noted Axillary Nodes:  No palpable lymphadenopathy Psych:  memory intact for recent and remote, normally interactive, and good eye contact.     Impression & Recommendations:  Problem # 1:  POSTURAL HYPOTENSION (ICD-458.0)  Problem # 2:  NIGHT SWEATS (ICD-780.8)  Problem # 3:  TREMOR (ICD-781.0) Benign intention tremor  Problem # 4:  DEPRESSION (ICD-311)  P-P discussed  His updated medication list for this problem includes:    Sertraline Hcl 50 Mg Tabs (Sertraline hcl) .Marland Kitchen... 1 once daily  Complete Medication List: 1)  Viagra 100 Mg Tabs (Sildenafil citrate) .... As directed 2)  Trilipix 135 Mg Cpdr (Choline fenofibrate) .Marland Kitchen.. 1 by mouth once daily 3)  Crestor 20 Mg Tabs (Rosuvastatin calcium) .Marland Kitchen.. 1 by mouth once daily 4)  Clarinex 5 Mg Tabs (Desloratadine) .Marland Kitchen.. 1 by mouth once daily as needed 5)  Prinzide 20-12.5 Mg Tabs (Lisinopril-hydrochlorothiazide) .Marland Kitchen.. 1 by mouth once daily. 6)  Sertraline Hcl 50 Mg Tabs (Sertraline hcl) .Marland Kitchen.. 1 once daily  Patient Instructions: 1)  Check your Blood Pressure regularly supine & sitting . Do isometrics pre standing . Avoid stimulants to prevent  tremor. Prescriptions: SERTRALINE HCL 50 MG TABS (SERTRALINE HCL) 1 once daily  #30 x 5   Entered and Authorized by:   Marga Melnick MD   Signed by:   Marga Melnick MD on 08/27/2009   Method used:   Print then Give to Patient   RxID:   [id number]

## 2010-08-10 NOTE — Assessment & Plan Note (Signed)
Summary: rov/sp   Visit Type:  Follow-up Primary Provider:  Laury Axon  CC:  dyslipidemia follow-up.  History of Present Illness:  Lipid Clinic Visit      The patient comes in today for dyslipidemia follow-up.  The patient has no complaints of chest pain, muscle aches, and muscle cramps.  He is currently suppose to be taking Trilipix 135mg  daily and Crestor 20mg  for his cholesterol.  He has not been compliant with these medication, specifically Trilipix secondary to cost.   Dietary compliance review reveals patient has not been following a low choesterol diet recently.  In the past he was able to follow a strict diet and then allow himself to "cheat" on Fridays.  Recently he has had some depression, which increased his food intake.  He has increased snack foods such as potato chips and fried foods.  He also reports drinking more beers over the summer.   Review of exercise habits reveals that the patient is not exercising due to back pain.  He is currently on disablility for this.  He is not a current smoker; he quit approximately 3 years ago.    Lipid Management Provider  Weston Brass, PharmD  Current Medications (verified): 1)  Viagra 100 Mg Tabs (Sildenafil Citrate) .... As Directed 2)  Crestor 20 Mg Tabs (Rosuvastatin Calcium) .Marland Kitchen.. 1 By Mouth Once Daily 3)  Prinzide 20-12.5 Mg Tabs (Lisinopril-Hydrochlorothiazide) .Marland Kitchen.. 1 By Mouth Once Daily. 4)  Claritin 10 Mg Tabs (Loratadine) .Marland Kitchen.. 1 By Mouth Once Daily As Needed Allergies 5)  Fenofibrate 160 Mg Tabs (Fenofibrate) .... Take One Tablet By Mouth Daily With A Meal  Allergies (verified): 1)  ! Pcn   Vital Signs:  Patient profile:   55 year old male Weight:      190 pounds  Impression & Recommendations:  Problem # 1:  PURE HYPERCHOLESTEROLEMIA (ICD-272.0) Assessment Deteriorated Pt's cholesterol is much worse since last checked 2 months ago.  TC- 210 (goal<200), TG- 278 (goal<150), HDL- 64.2 (goal>40), and LDL 60 (goal<100).  LFTs are  WNL.  His TG have over doubled.  This is likely due to combination of diet and inablitily to afford medications.  Will change to generic fenofibrate to help with affordability.  Suggested pt restart his "Free Friday" diet plan as he previously followed.  Asked him to decrease number of beers he drinks as this can increase TG and increase risk for pancreatitis.  Will f/u with repeat labs in 2 months.   The following medications were removed from the medication list:    Trilipix 135 Mg Cpdr (Choline fenofibrate) .Marland Kitchen... 1 by mouth once daily    Antara 130 Mg Caps (Fenofibrate micronized) .Marland Kitchen... 1 by mouth qd His updated medication list for this problem includes:    Crestor 20 Mg Tabs (Rosuvastatin calcium) .Marland Kitchen... 1 by mouth once daily    Fenofibrate 160 Mg Tabs (Fenofibrate) .Marland Kitchen... Take one tablet by mouth daily with a meal  Patient Instructions: 1)  Change Trilipix to fenofibrate 160mg  once daily with food 2)  Continue Crestor 20mg   3)  Start previous diet of "Free Fridays" 4)  Lab work: 10/3 at 8:40 5)  Lipid Clinic Appt: 10/13 at 3pm  Prescriptions: FENOFIBRATE 160 MG TABS (FENOFIBRATE) Take one tablet by mouth daily with a meal  #30 x 6   Entered by:   Weston Brass PharmD   Authorized by:   Verne Carrow, MD   Signed by:   Weston Brass PharmD on 02/15/2010   Method used:  Electronically to        [facility] [address] (retail)       [phone]       [place], [place]  [address]       Ph: [phone]       Fax: [phone]   RxID:   [id number]

## 2010-08-10 NOTE — Assessment & Plan Note (Signed)
Summary: COUGH/CBS   Vital Signs:  Patient profile:   55 year old male Weight:      186 pounds Temp:     98.2 degrees F oral Pulse rate:   90 / minute Pulse rhythm:   regular BP sitting:   132 / 84  (left arm) Cuff size:   regular  Vitals Entered By: Army Fossa CMA (Nov 26, 2009 2:11 PM) CC: Pt here c/o cough x 2-3 days., Cough   History of Present Illness:  Cough      This is a 55 year old man who presents with Cough.  The symptoms began 3 days ago.  The patient reports productive cough, but denies non-productive cough, pleuritic chest pain, shortness of breath, wheezing, exertional dyspnea, fever, hemoptysis, and malaise.  The patient denies the following symptoms: cold/URI symptoms, sore throat, nasal congestion, chronic rhinitis, weight loss, acid reflux symptoms, and peripheral edema.    Current Medications (verified): 1)  Viagra 100 Mg Tabs (Sildenafil Citrate) .... As Directed 2)  Trilipix 135 Mg Cpdr (Choline Fenofibrate) .Marland Kitchen.. 1 By Mouth Once Daily 3)  Crestor 20 Mg Tabs (Rosuvastatin Calcium) .Marland Kitchen.. 1 By Mouth Once Daily 4)  Prinzide 20-12.5 Mg Tabs (Lisinopril-Hydrochlorothiazide) .Marland Kitchen.. 1 By Mouth Once Daily. 5)  Zithromax Z-Pak 250 Mg Tabs (Azithromycin) .... As Directed  Allergies: 1)  ! Pcn  Past History:  Past medical, surgical, family and social histories (including risk factors) reviewed for relevance to current acute and chronic problems.  Past Medical History: Reviewed history from 03/31/2008 and no changes required. Depression Hyperlipidemia Erectile Dysfunction Hypertension  Past Surgical History: Reviewed history from 03/31/2008 and no changes required. Appendectomy Inguinal herniorrhaphy elbow  sugery  wrist surgery  Family History: Reviewed history from 04/06/2009 and no changes required. Family History of CAD Male 1st degree relative 56yo Family History of CAD Male 1st degree relative 56yo F--alz dementia Family H parkinsons    Social History: Reviewed history from 11/27/2008 and no changes required. Married Retired Never Smoked Alcohol use-yes Regular exercise-no  Review of Systems      See HPI  Physical Exam  General:  Well-developed,well-nourished,in no acute distress; alert,appropriate and cooperative throughout examination Ears:  External ear exam shows no significant lesions or deformities.  Otoscopic examination reveals clear canals, tympanic membranes are intact bilaterally without bulging, retraction, inflammation or discharge. Hearing is grossly normal bilaterally. Nose:  External nasal examination shows no deformity or inflammation. Nasal mucosa are pink and moist without lesions or exudates. Mouth:  Oral mucosa and oropharynx without lesions or exudates.  Teeth in good repair. Neck:  No deformities, masses, or tenderness noted. Lungs:  R decreased breath sounds and L decreased breath sounds.   Heart:  Normal rate and regular rhythm. S1 and S2 normal without gallop, murmur, click, rub or other extra sounds. Cervical Nodes:  No lymphadenopathy noted Psych:  Cognition and judgment appear intact. Alert and cooperative with normal attention span and concentration. No apparent delusions, illusions, hallucinations   Impression & Recommendations:  Problem # 1:  BRONCHITIS- ACUTE (ICD-466.0)  His updated medication list for this problem includes:    Zithromax Z-pak 250 Mg Tabs (Azithromycin) .Marland Kitchen... As directed  Take antibiotics and other medications as directed. Encouraged to push clear liquids, get enough rest, and take acetaminophen as needed. To be seen in 5-7 days if no improvement, sooner if worse.  Complete Medication List: 1)  Viagra 100 Mg Tabs (Sildenafil citrate) .... As directed 2)  Trilipix 135 Mg Cpdr (Choline fenofibrate) .Marland KitchenMarland KitchenMarland Kitchen  1 by mouth once daily 3)  Crestor 20 Mg Tabs (Rosuvastatin calcium) .Marland Kitchen.. 1 by mouth once daily 4)  Prinzide 20-12.5 Mg Tabs (Lisinopril-hydrochlorothiazide)  .Marland Kitchen.. 1 by mouth once daily. 5)  Zithromax Z-pak 250 Mg Tabs (Azithromycin) .... As directed Prescriptions: PREDNISONE 10 MG TABS (PREDNISONE) 3 by mouth once daily FOR 3 DAYS THEN 2 by mouth once daily FOR 3 DAYS THEN 1 by mouth once daily FOR 3 DAYS  #18 x 0   Entered and Authorized by:   Loreen Freud DO   Signed by:   Loreen Freud DO on 11/26/2009   Method used:   Electronically to        [facility] [phone]* (retail)       [address]       [place], [place]  [address]       Ph: [phone]       Fax: [phone]   RxID:   [id number] ZITHROMAX Z-PAK 250 MG TABS (AZITHROMYCIN) as directed  #1 x 0   Entered and Authorized by:   Loreen Freud DO   Signed by:   Loreen Freud DO on 11/26/2009   Method used:   Electronically to        [facility] [address] (retail)       [phone]       [place], [place]  [address]       Ph: [phone]       Fax: [phone]   RxID:   [id number]

## 2010-08-12 ENCOUNTER — Telehealth (INDEPENDENT_AMBULATORY_CARE_PROVIDER_SITE_OTHER): Payer: Self-pay | Admitting: *Deleted

## 2010-08-12 NOTE — Assessment & Plan Note (Signed)
Summary: WANTS TO DISCUSS PAPERWORK//KP   Vital Signs:  Patient profile:   55 year old male Weight:      188.6 pounds Pulse rate:   60 / minute Pulse rhythm:   regular BP sitting:   140 / 82  (right arm) Cuff size:   regular  Vitals Entered By: Almeta Monas CMA Duncan Dull) (July 19, 2010 4:18 PM) CC: needs to complete paperwork---pt has not taken BP meds today   History of Present Illness: Pt here to get paperwork filled out for disability.  Pt has been disabled since 1988 secondary to depression and weakness in L arm.    Current Medications (verified): 1)  Viagra 100 Mg Tabs (Sildenafil Citrate) .... As Directed 2)  Crestor 20 Mg Tabs (Rosuvastatin Calcium) .Marland Kitchen.. 1 By Mouth Once Daily 3)  Prinzide 20-12.5 Mg Tabs (Lisinopril-Hydrochlorothiazide) .Marland Kitchen.. 1 By Mouth Once Daily. 4)  Claritin 10 Mg Tabs (Loratadine) .Marland Kitchen.. 1 By Mouth Once Daily As Needed Allergies 5)  Fenofibrate 160 Mg Tabs (Fenofibrate) .... Take One Tablet By Mouth Daily With A Meal 6)  Veramyst 27.5 Mcg/spray Susp (Fluticasone Furoate) .... 2 Sprays Each Nostril Once Daily 7)  Cipro 500 Mg Tabs (Ciprofloxacin Hcl) .Marland Kitchen.. 1 By Mouth 2 Times Daily X10 Days.  Take W/ Food  Allergies (verified): 1)  ! Pcn  Past History:  Past Medical History: Last updated: 05/07/2010 Depression Hyperlipidemia Erectile Dysfunction Hypertension recurrent sinus infxns  Past Surgical History: Last updated: 03/31/2008 Appendectomy Inguinal herniorrhaphy elbow  sugery  wrist surgery  Family History: Last updated: 04/06/2009 Family History of CAD Male 1st degree relative 56yo Family History of CAD Male 1st degree relative 56yo F--alz dementia Family H parkinsons   Social History: Last updated: 11/27/2008 Married Retired Never Smoked Alcohol use-yes Regular exercise-no  Risk Factors: Alcohol Use: 2 (04/16/2010) Caffeine Use: 1 (04/16/2010) Exercise: no (04/16/2010)  Risk Factors: Smoking Status: never  (04/16/2010) Passive Smoke Exposure: no (04/16/2010)  Family History: Reviewed history from 04/06/2009 and no changes required. Family History of CAD Male 1st degree relative 56yo Family History of CAD Male 1st degree relative 56yo F--alz dementia Family H parkinsons   Social History: Reviewed history from 11/27/2008 and no changes required. Married Retired Never Smoked Alcohol use-yes Regular exercise-no  Review of Systems      See HPI  Physical Exam  General:  Well-developed,well-nourished,in no acute distress; alert,appropriate and cooperative throughout examination Psych:  Cognition and judgment appear intact. Alert and cooperative with normal attention span and concentration. No apparent delusions, illusions, hallucinations   Impression & Recommendations:  Problem # 1:  DEPRESSION (ICD-311)  Problem # 2:  WEAKNESS (ICD-780.79)  Complete Medication List: 1)  Viagra 100 Mg Tabs (Sildenafil citrate) .... As directed 2)  Crestor 20 Mg Tabs (Rosuvastatin calcium) .Marland Kitchen.. 1 by mouth once daily 3)  Prinzide 20-12.5 Mg Tabs (Lisinopril-hydrochlorothiazide) .Marland Kitchen.. 1 by mouth once daily. 4)  Claritin 10 Mg Tabs (Loratadine) .Marland Kitchen.. 1 by mouth once daily as needed allergies 5)  Fenofibrate 160 Mg Tabs (Fenofibrate) .... Take one tablet by mouth daily with a meal 6)  Veramyst 27.5 Mcg/spray Susp (Fluticasone furoate) .... 2 sprays each nostril once daily 7)  Cipro 500 Mg Tabs (Ciprofloxacin hcl) .Marland Kitchen.. 1 by mouth 2 times daily x10 days.  take w/ food   Orders Added: 1)  Est. Patient Level III [47829]

## 2010-08-12 NOTE — Assessment & Plan Note (Signed)
Summary: sinus, headache, no cough///sph   Vital Signs:  Patient profile:   55 year old male Weight:      189 pounds BMI:     26.09 Temp:     97.8 degrees F oral BP sitting:   110 / 64  (left arm)  Vitals Entered By: Doristine Devoid CMA (July 13, 2010 11:26 AM) CC: still w/ HA and sinus congestion    History of Present Illness: 55 yo man here today for continued HA and sinus congestion.  was seen 12/23 w/ similar sxs and started on Zpack.  having dizziness, 'it's like my sinuses are going to explode'.  using OTC [facility] Sinus Med and Veramyst.  dark nasal drainage.  + tooth pain.  no fevers.  no ear pain.  Current Medications (verified): 1)  Viagra 100 Mg Tabs (Sildenafil Citrate) .... As Directed 2)  Crestor 20 Mg Tabs (Rosuvastatin Calcium) .Marland Kitchen.. 1 By Mouth Once Daily 3)  Prinzide 20-12.5 Mg Tabs (Lisinopril-Hydrochlorothiazide) .Marland Kitchen.. 1 By Mouth Once Daily. 4)  Claritin 10 Mg Tabs (Loratadine) .Marland Kitchen.. 1 By Mouth Once Daily As Needed Allergies 5)  Fenofibrate 160 Mg Tabs (Fenofibrate) .... Take One Tablet By Mouth Daily With A Meal 6)  Veramyst 27.5 Mcg/spray Susp (Fluticasone Furoate) .... 2 Sprays Each Nostril Once Daily  Allergies (verified): 1)  ! Pcn  Review of Systems      See HPI  Physical Exam  General:  Well-developed,well-nourished,in no acute distress; alert,appropriate and cooperative throughout examination Head:  NCAT, + TTP over frontal sinuses Eyes:  no injxn or inflammation Ears:  External ear exam shows no significant lesions or deformities.  Otoscopic examination reveals clear canals, tympanic membranes are intact bilaterally without bulging, retraction, inflammation or discharge. Hearing is grossly normal bilaterally. Nose:  + congestion Mouth:  pharyngeal erythema and postnasal drip.   Neck:  supple and cervical lymphadenopathy.   Lungs:  Normal respiratory effort, chest expands symmetrically. Lungs are clear to auscultation, no crackles or wheezes. Heart:   Normal rate and regular rhythm. S1 and S2 normal without gallop, murmur, click, rub or other extra sounds.   Impression & Recommendations:  Problem # 1:  SINUSITIS - ACUTE-NOS (ICD-461.9) pt appears more symptomatic this time than at last visit.  since Zpack did not work will switch to Cipro.  encouraged him to restart his allergy medication but limit the amount of OTC sinus meds due to overdrying.  reviewed supportive care and red flags that should prompt return.  Pt expresses understanding and is in agreement w/ this plan. His updated medication list for this problem includes:    Veramyst 27.5 Mcg/spray Susp (Fluticasone furoate) .Marland Kitchen... 2 sprays each nostril once daily    Cipro 500 Mg Tabs (Ciprofloxacin hcl) .Marland Kitchen... 1 by mouth 2 times daily x10 days.  take w/ food  Complete Medication List: 1)  Viagra 100 Mg Tabs (Sildenafil citrate) .... As directed 2)  Crestor 20 Mg Tabs (Rosuvastatin calcium) .Marland Kitchen.. 1 by mouth once daily 3)  Prinzide 20-12.5 Mg Tabs (Lisinopril-hydrochlorothiazide) .Marland Kitchen.. 1 by mouth once daily. 4)  Claritin 10 Mg Tabs (Loratadine) .Marland Kitchen.. 1 by mouth once daily as needed allergies 5)  Fenofibrate 160 Mg Tabs (Fenofibrate) .... Take one tablet by mouth daily with a meal 6)  Veramyst 27.5 Mcg/spray Susp (Fluticasone furoate) .... 2 sprays each nostril once daily 7)  Cipro 500 Mg Tabs (Ciprofloxacin hcl) .Marland Kitchen.. 1 by mouth 2 times daily x10 days.  take w/ food  Patient Instructions: 1)  This is a sinus infection 2)  Take the Cipro as directed for the infection 3)  Use the Veramyst for the allergy component 4)  Restart the Claritin- available as generic Loratidine at [facility] 5)  Drink plenty of fluids 6)  REST! 7)  Call with any questions or concerns 8)  Hang in there!! Prescriptions: CIPRO 500 MG TABS (CIPROFLOXACIN HCL) 1 by mouth 2 times daily x10 days.  take w/ food  #20 x 0   Entered and Authorized by:   Neena Rhymes MD   Signed by:   Neena Rhymes MD on 07/13/2010    Method used:   Electronically to        [facility] [address] (retail)       [phone]       [place], [place]  [address]       Ph: [phone]       Fax: [phone]   RxID:   [id number]    Orders Added: 1)  Est. Patient Level III [28315]

## 2010-08-12 NOTE — Assessment & Plan Note (Signed)
Summary: sinus infection//fd   Vital Signs:  Patient profile:   55 year old male Weight:      191 pounds BMI:     26.36 Temp:     98.1 degrees F oral BP sitting:   124 / 72  (left arm)  Vitals Entered By: Doristine Devoid CMA (July 02, 2010 4:24 PM) CC: sinus infection x2 days    History of Present Illness: 55 yo man here today for ? sinus infxn.  pt w/ frontal sinus pain since Wed.  no fevers.  no ear pain, nasal congestion, sore throat.  no cough.  no known sick contacts.  Current Medications (verified): 1)  Viagra 100 Mg Tabs (Sildenafil Citrate) .... As Directed 2)  Crestor 20 Mg Tabs (Rosuvastatin Calcium) .Marland Kitchen.. 1 By Mouth Once Daily 3)  Prinzide 20-12.5 Mg Tabs (Lisinopril-Hydrochlorothiazide) .Marland Kitchen.. 1 By Mouth Once Daily. 4)  Claritin 10 Mg Tabs (Loratadine) .Marland Kitchen.. 1 By Mouth Once Daily As Needed Allergies 5)  Fenofibrate 160 Mg Tabs (Fenofibrate) .... Take One Tablet By Mouth Daily With A Meal 6)  Veramyst 27.5 Mcg/spray Susp (Fluticasone Furoate) .... 2 Sprays Each Nostril Once Daily 7)  Azithromycin 250 Mg  Tabs (Azithromycin) .... 2 By  Mouth Today and Then 1 Daily For 4 Days  Allergies (verified): 1)  ! Pcn  Review of Systems      See HPI  Physical Exam  General:  Well-developed,well-nourished,in no acute distress; alert,appropriate and cooperative throughout examination Head:  NCAT, + TTP over frontal sinuses Eyes:  no injxn or inflammation Ears:  External ear exam shows no significant lesions or deformities.  Otoscopic examination reveals clear canals, tympanic membranes are intact bilaterally without bulging, retraction, inflammation or discharge. Hearing is grossly normal bilaterally. Nose:  + congestion Mouth:  pharyngeal erythema and postnasal drip.   Neck:  supple and cervical lymphadenopathy.   Lungs:  Normal respiratory effort, chest expands symmetrically. Lungs are clear to auscultation, no crackles or wheezes. Heart:  Normal rate and regular rhythm. S1  and S2 normal without gallop, murmur, click, rub or other extra sounds.   Impression & Recommendations:  Problem # 1:  SINUSITIS - ACUTE-NOS (ICD-461.9) Assessment Unchanged pt's PE and sxs consistent w/ early sinus infxn.  start abx given that daughter is due to give birth any day.  reviewed supportive care and red flags that should prompt return.  Pt expresses understanding and is in agreement w/ this plan. His updated medication list for this problem includes:    Veramyst 27.5 Mcg/spray Susp (Fluticasone furoate) .Marland Kitchen... 2 sprays each nostril once daily    Azithromycin 250 Mg Tabs (Azithromycin) .Marland Kitchen... 2 by  mouth today and then 1 daily for 4 days  Complete Medication List: 1)  Viagra 100 Mg Tabs (Sildenafil citrate) .... As directed 2)  Crestor 20 Mg Tabs (Rosuvastatin calcium) .Marland Kitchen.. 1 by mouth once daily 3)  Prinzide 20-12.5 Mg Tabs (Lisinopril-hydrochlorothiazide) .Marland Kitchen.. 1 by mouth once daily. 4)  Claritin 10 Mg Tabs (Loratadine) .Marland Kitchen.. 1 by mouth once daily as needed allergies 5)  Fenofibrate 160 Mg Tabs (Fenofibrate) .... Take one tablet by mouth daily with a meal 6)  Veramyst 27.5 Mcg/spray Susp (Fluticasone furoate) .... 2 sprays each nostril once daily 7)  Azithromycin 250 Mg Tabs (Azithromycin) .... 2 by  mouth today and then 1 daily for 4 days  Patient Instructions: 1)  Take the Azithromycin for the sinus infection 2)  Add Mucinex to thin your congestion 3)  Tylenol/Ibuprofen as needed for  pain or fever 4)  Drink plenty of fluids 5)  Rest as you are able! 6)  Hang in there! 7)  Happy Holidays! Prescriptions: AZITHROMYCIN 250 MG  TABS (AZITHROMYCIN) 2 by  mouth today and then 1 daily for 4 days  #6 x 0   Entered and Authorized by:   Neena Rhymes MD   Signed by:   Neena Rhymes MD on 07/02/2010   Method used:   Electronically to        [facility] [address] (retail)       [phone]       [place], [place]  [address]       Ph:  [phone]       Fax: [phone]   RxID:   [id number]    Orders Added: 1)  Est. Patient Level III [25366]

## 2010-08-18 NOTE — Progress Notes (Signed)
Summary: prinzide--out of meds--needs imm--going out of town  Phone Note Refill Request Call back at [facility] Phone [phone] Message from:  Patient on August 12, 2010 3:49 PM  Refills Requested: Medication #1:  PRINZIDE 20-12.5 MG TABS 1 by mouth once daily. [facility], [address], out of meds----going out of town as soon as pharm fills presc--please call patient to advise that prescription has been sent to [facility]   Initial call taken by: Jerolyn Shin,  August 12, 2010 3:50 PM    Prescriptions: PRINZIDE 20-12.5 MG TABS (LISINOPRIL-HYDROCHLOROTHIAZIDE) 1 by mouth once daily.  #30 Each x 0   Entered by:   Almeta Monas CMA (AAMA)   Authorized by:   Loreen Freud DO   Signed by:   Almeta Monas CMA (AAMA) on 08/12/2010   Method used:   Faxed to ...       [facility] [address] (retail)       [phone]       [place], [place]  [address]       Ph: [phone]       Fax: [phone]   RxID:   [id number]

## 2010-09-26 LAB — URINALYSIS, ROUTINE W REFLEX MICROSCOPIC
Bilirubin Urine: NEGATIVE
Glucose, UA: NEGATIVE mg/dL
Hgb urine dipstick: NEGATIVE
Specific Gravity, Urine: 1.027 (ref 1.005–1.030)

## 2010-09-26 LAB — COMPREHENSIVE METABOLIC PANEL
Albumin: 4.4 g/dL (ref 3.5–5.2)
Alkaline Phosphatase: 66 U/L (ref 39–117)
BUN: 8 mg/dL (ref 6–23)
CO2: 33 mEq/L — ABNORMAL HIGH (ref 19–32)
Chloride: 100 mEq/L (ref 96–112)
GFR calc non Af Amer: >60 mL/min (ref >60)
Glucose, Bld: 92 mg/dL (ref 70–99)
Potassium: 3.5 mEq/L (ref 3.5–5.1)
Total Bilirubin: 0.7 mg/dL (ref 0.3–1.2)

## 2010-09-26 LAB — DIFFERENTIAL
Basophils Absolute: 0 10*3/uL (ref 0.0–0.1)
Basophils Relative: 1 % (ref 0–1)
Monocytes Absolute: 0.4 10*3/uL (ref 0.1–1.0)
Neutro Abs: 2.8 10*3/uL (ref 1.7–7.7)

## 2010-09-26 LAB — CBC
HCT: 40.3 % (ref 39.0–52.0)
Hemoglobin: 13.6 g/dL (ref 13.0–17.0)
MCV: 92.6 fL (ref 78.0–100.0)
Platelets: 269 10*3/uL (ref 150–400)
RBC: 4.35 MIL/uL (ref 4.22–5.81)
WBC: 4.9 10*3/uL (ref 4.0–10.5)

## 2010-09-26 LAB — STOOL CULTURE

## 2010-09-26 LAB — URINE MICROSCOPIC-ADD ON

## 2010-10-09 ENCOUNTER — Encounter: Payer: Self-pay | Admitting: Family Medicine

## 2010-10-12 ENCOUNTER — Ambulatory Visit (INDEPENDENT_AMBULATORY_CARE_PROVIDER_SITE_OTHER): Payer: Managed Care, Other (non HMO) | Admitting: Family Medicine

## 2010-10-12 ENCOUNTER — Encounter: Payer: Self-pay | Admitting: Family Medicine

## 2010-10-12 VITALS — BP 120/80 | Temp 98.0°F | Wt 186.0 lb

## 2010-10-12 DIAGNOSIS — J329 Chronic sinusitis, unspecified: Secondary | ICD-10-CM | POA: Insufficient documentation

## 2010-10-12 DIAGNOSIS — F329 Major depressive disorder, single episode, unspecified: Secondary | ICD-10-CM

## 2010-10-12 DIAGNOSIS — M25559 Pain in unspecified hip: Secondary | ICD-10-CM

## 2010-10-12 DIAGNOSIS — M25551 Pain in right hip: Secondary | ICD-10-CM | POA: Insufficient documentation

## 2010-10-12 DIAGNOSIS — I10 Essential (primary) hypertension: Secondary | ICD-10-CM

## 2010-10-12 DIAGNOSIS — E785 Hyperlipidemia, unspecified: Secondary | ICD-10-CM

## 2010-10-12 DIAGNOSIS — F528 Other sexual dysfunction not due to a substance or known physiological condition: Secondary | ICD-10-CM

## 2010-10-12 DIAGNOSIS — M25552 Pain in left hip: Secondary | ICD-10-CM

## 2010-10-12 MED ORDER — VARDENAFIL HCL 20 MG PO TABS: 20.0000 mg | ORAL_TABLET | Freq: Every day | ORAL | Status: DC | PRN

## 2010-10-12 MED ORDER — FENOFIBRATE 160 MG PO TABS: 160.0000 mg | ORAL_TABLET | Freq: Every day | ORAL | Status: DC

## 2010-10-12 MED ORDER — HYDROCODONE-ACETAMINOPHEN 7.5-750 MG PO TABS: 1.0000 | ORAL_TABLET | Freq: Four times a day (QID) | ORAL | Status: DC | PRN

## 2010-10-12 MED ORDER — CLARITHROMYCIN ER 500 MG PO TB24: 1000.0000 mg | ORAL_TABLET | Freq: Every day | ORAL | Status: AC

## 2010-10-12 MED ORDER — CITALOPRAM HYDROBROMIDE 20 MG PO TABS: 20.0000 mg | ORAL_TABLET | Freq: Every day | ORAL | Status: DC

## 2010-10-12 MED ORDER — FLUTICASONE PROPIONATE 50 MCG/ACT NA SUSP: 2.0000 | Freq: Every day | NASAL | Status: DC

## 2010-10-12 NOTE — Patient Instructions (Signed)
Hold crestor while on biaxin

## 2010-10-12 NOTE — Assessment & Plan Note (Signed)
F/u ortho Pt will need surgery per ortho

## 2010-10-12 NOTE — Assessment & Plan Note (Signed)
Take abx and flonase Hold crestor while on abx

## 2010-10-12 NOTE — Assessment & Plan Note (Signed)
con't meds---hold crestor while on biaxin

## 2010-10-12 NOTE — Assessment & Plan Note (Signed)
Start celexa rto 4-6 weeks

## 2010-10-12 NOTE — Assessment & Plan Note (Signed)
Refill Levitra

## 2010-10-12 NOTE — Progress Notes (Signed)
  Subjective:    Patient ID: Dennis Moore, male    DOB: 1955-07-17, 55 y.o.   MRN: [id number]  Sinusitis This is a new problem. The current episode started 1 to 4 weeks ago. The problem has been gradually worsening since onset. There has been no fever. The pain is moderate. Associated symptoms include congestion, ear pain, headaches, sinus pressure, sneezing and a sore throat. Pertinent negatives include no chills, coughing, diaphoresis, hoarse voice, neck pain, shortness of breath or swollen glands. Past treatments include nothing.   Depression--- Pt feels like he has had a relapse of depression.  He can't concentrate.   His teenage daughter just had a baby and other stress.  He has started helping others and that makes him feel better.   Pt feels like he needs some successes to feel better.  Pt states his hips are bothering him a lot more.  He needs to go back to ortho and discuss options, including surgery.      Review of Systems  Constitutional: Positive for fatigue. Negative for fever, chills, diaphoresis, activity change, appetite change and unexpected weight change.  HENT: Positive for ear pain, congestion, sore throat, rhinorrhea, sneezing, postnasal drip and sinus pressure. Negative for hoarse voice and neck pain.   Respiratory: Negative for cough and shortness of breath.   Musculoskeletal: Positive for arthralgias. Negative for back pain, joint swelling and gait problem.  Neurological: Positive for headaches.  Psychiatric/Behavioral: Positive for dysphoric mood and decreased concentration. Negative for hallucinations, behavioral problems, confusion and agitation.       Objective:   Physical Exam  Constitutional: He is oriented to person, place, and time. He appears well-developed and well-nourished. No distress.  Eyes: Conjunctivae are normal. Right eye exhibits no discharge. Left eye exhibits no discharge.  Neck: Normal range of motion. Neck supple.  Cardiovascular: Normal rate  and regular rhythm.   No murmur heard. Pulmonary/Chest: Effort normal and breath sounds normal. No respiratory distress. He has no wheezes. He has no rales. He exhibits no tenderness.  Abdominal: Soft. Bowel sounds are normal.  Musculoskeletal: He exhibits tenderness.  Lymphadenopathy:    He has cervical adenopathy.  Neurological: He is alert and oriented to person, place, and time.  Skin: Skin is warm and dry.  Psychiatric: His speech is normal. Judgment and thought content normal. His mood appears anxious. His affect is not inappropriate. He is withdrawn. He is not agitated, not aggressive and is not hyperactive. Cognition and memory are normal. He exhibits a depressed mood.       + depression No suicidal ideation           Assessment & Plan:

## 2010-10-12 NOTE — Assessment & Plan Note (Signed)
Check labs con't meds 

## 2010-10-13 ENCOUNTER — Encounter: Payer: Self-pay | Admitting: *Deleted

## 2010-10-13 LAB — BASIC METABOLIC PANEL
BUN: 20 mg/dL (ref 6–23)
Calcium: 9.5 mg/dL (ref 8.4–10.5)
GFR: 81.5 mL/min (ref >60.00)
Glucose, Bld: 93 mg/dL (ref 70–99)
Sodium: 141 mEq/L (ref 135–145)

## 2010-10-13 LAB — LIPID PANEL
Cholesterol: 164 mg/dL (ref 0–200)
HDL: 70.7 mg/dL (ref >39.00)
VLDL: 26.8 mg/dL (ref 0.0–40.0)

## 2010-10-13 LAB — HEPATIC FUNCTION PANEL
Albumin: 4.2 g/dL (ref 3.5–5.2)
Alkaline Phosphatase: 36 U/L — ABNORMAL LOW (ref 39–117)

## 2010-10-17 LAB — BASIC METABOLIC PANEL
BUN: 13 mg/dL (ref 6–23)
Calcium: 9.1 mg/dL (ref 8.4–10.5)
Creatinine, Ser: 1.2 mg/dL (ref 0.4–1.5)
GFR calc non Af Amer: >60 mL/min (ref >60)

## 2010-10-17 LAB — URINALYSIS, ROUTINE W REFLEX MICROSCOPIC
Glucose, UA: NEGATIVE mg/dL
Nitrite: NEGATIVE
Protein, ur: NEGATIVE mg/dL
pH: 7 (ref 5.0–8.0)

## 2010-10-17 LAB — DIFFERENTIAL
Lymphocytes Relative: 54 % — ABNORMAL HIGH (ref 12–46)
Lymphs Abs: 3.6 10*3/uL (ref 0.7–4.0)
Neutrophils Relative %: 38 % — ABNORMAL LOW (ref 43–77)

## 2010-10-17 LAB — CBC
Platelets: 312 10*3/uL (ref 150–400)
WBC: 6.5 10*3/uL (ref 4.0–10.5)

## 2010-10-17 LAB — POCT CARDIAC MARKERS: Troponin i, poc: <0.05 ng/mL (ref 0.00–0.09)

## 2010-11-23 NOTE — Assessment & Plan Note (Signed)
[facility]                               LIPID CLINIC NOTE   Dennis, Moore                      MRN:          [id number]  DATE:04/17/2008                            DOB:          03/07/1956    Dennis Moore comes in today for followup of his hyperlipidemia therapy  which includes Trilipix 135 mg daily.  He has been compliant with it,  tolerating it just fine.  He denies any muscle aches and pains.   Other medications include lisinopril 10 mg daily and hydrochlorothiazide  12.5 mg daily, both started recently for hypertension.   PHYSICAL EXAMINATION:  He is in good spirits and looks great.  His  weight is down to 186 pounds, this is down 10 pounds over the last 6  months.  Blood pressure is 100/55, heart rate is 60.   LABORATORY DATA:  Total cholesterol 147, triglycerides 73, HDL 58.5, LDL  74.  Liver function tests are within normal limits.   ASSESSMENT:  Lipid values are all at goal and are much improved since  May and July readings.  He has really turned around his habits as far as  exercise and diet.  He has been eating a very low-carbohydrate diet with  [place], fish, chicken, almost no potatoes, noodles, breads.  He eats  healthy nuts as a snack.  He also tries to minimize his sodium intake.   PLAN:  We are going to continue with the same medications for now,  although I think it could possibly be decreased to 45 mg daily if his  lipid panel stays as well as it is because of the lifestyle  modifications he has made.  He has a real positive attitude about his  new diet and the amount of exercise he is getting.  He feels good with  his weight loss and is really pleased with his numbers.  I encouraged  him, congratulated him.  We are going to follow up with him in 6 months.  We will check labs, and at that time we will make any adjustments that  are necessary.  If they remain as low as they are, we will decrease his  Trilipix to 45 mg  daily, and I also talked to him about possibly  reducing the dose and not needing to be followed up by [place] if it stays  under control with continued followup with his primary care doctor, Dr.  Laury Moore.      Dennis Moore, PharmD  Electronically Signed      Rollene Rotunda, MD, [facility]  Electronically Signed   TP/MedQ  DD: 04/17/2008  DT: 04/18/2008  Job #: [id number]

## 2010-11-23 NOTE — Assessment & Plan Note (Signed)
[facility]                               LIPID CLINIC NOTE   FRAN, MCREE                      MRN:          [id number]  DATE:11/29/2007                            DOB:          07-31-1955    HISTORY OF PRESENT ILLNESS:  Mr. Printup comes in today for follow-up of  his initial visit with [place] in the Lipid Clinic.  He was referred to [place] by  Dr. Laury Axon because of his hypertriglyceridemia.   PAST MEDICAL HISTORY:  1. Hyperlipidemia.  2. Depression.  3. Erectile dysfunction.   CURRENT MEDICATIONS:  1. Levitra p.r.n.  2. He was prescribed Crestor, but has never really taken it on a      regular basis.  He denies taking any other cholesterol medicines.   ALLERGIES:  PENICILLIN.   SOCIAL HISTORY:  He is a nonsmoker, occasional alcoholic drinks.  He  does not exercise at all.  His diet includes red meat up to four times a  week.  He skips breakfast most days.  His lunches consist of fast food  almost daily, burgers and [place] fries.  Dinner consists of a variety of  different foods, whatever his wife is fixing.  He is married.  He has  two children.   FAMILY HISTORY:  There is no family history of heart disease that he  knows of.   PHYSICAL EXAMINATION:  VITAL SIGNS:  Weight of 202 pounds, blood  pressure 125/65, heart rate is 66.   LABORATORY DATA:  Today includes total cholesterol 309, triglycerides  719, HDL 52 and LDL was not able to be calculated.  Liver function tests  are within normal limits.   ASSESSMENT:  Mr. Abel has hypertriglyceridemia and may very well have  elevated LDL.  He denies any history of diabetes and we have no fasting  glucose on hand to see if it is elevated.  We talked about the causes of  hyperlipidemia including lifestyle and genetics.  I talked to him about  exercising, how important it is, and also with his diet, cutting down on  the red meat to 1-2 days a week and cutting out fast food entirely, and  is  not able to do that, going with grilled chicken sandwiches, trying to  avoid [place] fries and all fried foods in general.  I asked him to read  about a heart healthy diet and talked to his wife about that, and try to  institute some of those changes.  I wanted him to go ahead and exercise  by walking 3 days a week for 20 minutes at least.  His goals for lipid  lowering include triglycerides less than 150, HDL greater than 40 and  LDL less than 100.  Due to his hypertriglyceridemia and his obesity, I  am concerned that he may be getting diabetes.   PLAN:  We will start Trilipix 45 mg daily for 2 weeks and then  increasing to 135 mg daily for 2 weeks.  Samples were given.  At the end  of that period when the samples are used up,  he was encouraged to call  the office here for a prescription to continue with the Trilipix.  We  are going to hold off on Crestor for now as our main goal at this point  is to reduce triglycerides.  He does have Crestor at home though and we  may institute that at his next visit.  I urged him to make the diet and  exercise changes that we talked about.  He seemed motivated.  I told him  there is plenty of room for improvement is lifestyle habits that may  very well get his cholesterol under control, but that his triglycerides  were high enough that we should start some medication today.  We are  going to follow up with him in 8 weeks.  He will get his laboratory work  done at the [facility], and an order was called into that  office today.  In addition to lipid and liver panel, we will obtain a  hemoglobin A1c and a B-met to assess his fasting glucose.  He was  instructed to call us with any concerns or problems in the meantime.      Charolotte Eke, PharmD  Electronically Signed      Duke Salvia, MD, [facility]  Electronically Signed   TP/MedQ  DD: 11/29/2007  DT: 11/29/2007  Job #: [id number]   cc:   Lelon Perla, DO

## 2010-11-25 ENCOUNTER — Telehealth: Payer: Self-pay | Admitting: Family Medicine

## 2010-11-25 ENCOUNTER — Other Ambulatory Visit: Payer: Self-pay | Admitting: Family Medicine

## 2010-11-25 NOTE — Telephone Encounter (Signed)
Patient called to check on prescription for Lisinopril - HCTZ----advised him that we are aware of request, but it has not been approved by doctor yet-----his question:   Why doesn't he have refills on this prescription??   Why does he have to get approval from our office every month for a one month prescription instead of just requesting a refill at the pharmacy??  He requests that this prescription be called back to pharmacy as soon as possible--he is out of meds

## 2010-11-25 NOTE — Telephone Encounter (Signed)
Duplicate msg.

## 2010-12-30 ENCOUNTER — Ambulatory Visit (INDEPENDENT_AMBULATORY_CARE_PROVIDER_SITE_OTHER): Payer: Managed Care, Other (non HMO) | Admitting: Family Medicine

## 2010-12-30 ENCOUNTER — Encounter: Payer: Self-pay | Admitting: Family Medicine

## 2010-12-30 VITALS — BP 122/76 | HR 69 | Temp 96.9°F | Wt 193.6 lb

## 2010-12-30 DIAGNOSIS — K219 Gastro-esophageal reflux disease without esophagitis: Secondary | ICD-10-CM

## 2010-12-30 NOTE — Patient Instructions (Signed)
Esophagitis (Heartburn) Esophagitis (heartburn) is a painful, burning sensation in the chest. It may feel worse in certain positions, such as lying down or bending over. It is caused by stomach acid backing up into the tube that carries food from the mouth down to the stomach (lower esophagus). TREATMENT There are a number of non-prescription medicines used to treat heartburn, including:  Antacids.   Acid reducers (also called H-2 blockers).   Proton-pump inhibitors.  HOME CARE INSTRUCTIONS  Raise the head of your bead by putting blocks under the legs.   Eat 2-3 hours before going to bed.   Stop smoking.   Try to reach and maintain a healthy weight.   Do not eat just a few very large meals. Instead, eat many smaller meals throughout the day.   Try to identify foods and beverages that make your symptoms worse, and avoid these.   Avoid tight clothing.   Do not exercise right after eating.  SEEK IMMEDIATE MEDICAL CARE IF YOU:  Have severe chest pain that goes down your arm, or into your jaw or neck.   Feel sweaty, dizzy, or lightheaded.   Are short of breath.   Throw up (vomit) blood.   Have difficulty or pain with swallowing.   Have bloody or black, tarry stools.   Have bouts of heartburn more than three times a week for more than two weeks.  Document Released: 08/04/2004 Document Re-Released: 09/21/2009 [employer] Patient Information 2011 [employer]. 

## 2010-12-30 NOTE — Progress Notes (Signed)
  Subjective:     Dennis Moore is an 55 y.o. male who presents for evaluation of heartburn. This has been associated with belching and heartburn. He denies abdominal bloating, early satiety, fullness after meals, laryngitis, nausea, shortness of breath and upper abdominal discomfort. Symptoms have been present for 6 days. He denies dysphagia. He has not lost weight. He denies melena, hematochezia, hematemesis, and coffee ground emesis. Medical therapy in the past has included: antacids.  The following portions of the patient's history were reviewed and updated as appropriate: allergies, current medications, past family history, past medical history, past social history, past surgical history and problem list.  Review of Systems Pertinent items are noted in HPI.   Objective:     BP 122/76  Pulse 69  Temp(Src) 96.9 F (36.1 C) (Oral)  Wt 193 lb 9.6 oz (87.816 kg)  SpO2 99% General appearance: alert, cooperative, appears stated age and no distress Neck: no adenopathy, no carotid bruit, no JVD, supple, symmetrical, trachea midline and thyroid not enlarged, symmetric, no tenderness/mass/nodules Lungs: clear to auscultation bilaterally Heart: regular rate and rhythm, S1, S2 normal, no murmur, click, rub or gallop Abdomen: abnormal findings:  mild tenderness in the epigastrium   Assessment:    Gastroesophageal Reflux Disease,      Plan:    Will start a trial of proton pump inhibitors. Follow up in 6 weeks or sooner as needed.

## 2011-01-25 ENCOUNTER — Telehealth: Payer: Self-pay | Admitting: Family Medicine

## 2011-01-25 NOTE — Telephone Encounter (Signed)
Pt aware has pending OV on Friday will discuss further than.

## 2011-01-25 NOTE — Telephone Encounter (Signed)
It was probably mentioned in passing one day--not as part of a visit Can he make his own appointment?  We can give him number for [place] derm---or does he have to have referral?

## 2011-01-25 NOTE — Telephone Encounter (Signed)
Please advise no documentation in OV do you remember discuss with Pt . Please advise

## 2011-01-28 ENCOUNTER — Encounter: Payer: Self-pay | Admitting: Family Medicine

## 2011-01-28 ENCOUNTER — Encounter: Payer: Self-pay | Admitting: Gastroenterology

## 2011-01-28 ENCOUNTER — Ambulatory Visit (INDEPENDENT_AMBULATORY_CARE_PROVIDER_SITE_OTHER): Payer: Managed Care, Other (non HMO) | Admitting: Family Medicine

## 2011-01-28 VITALS — BP 126/90 | HR 61 | Temp 98.6°F | Wt 198.0 lb

## 2011-01-28 DIAGNOSIS — I10 Essential (primary) hypertension: Secondary | ICD-10-CM

## 2011-01-28 DIAGNOSIS — F329 Major depressive disorder, single episode, unspecified: Secondary | ICD-10-CM

## 2011-01-28 DIAGNOSIS — D239 Other benign neoplasm of skin, unspecified: Secondary | ICD-10-CM

## 2011-01-28 DIAGNOSIS — E785 Hyperlipidemia, unspecified: Secondary | ICD-10-CM

## 2011-01-28 DIAGNOSIS — D229 Melanocytic nevi, unspecified: Secondary | ICD-10-CM

## 2011-01-28 DIAGNOSIS — F3289 Other specified depressive episodes: Secondary | ICD-10-CM

## 2011-01-28 DIAGNOSIS — K219 Gastro-esophageal reflux disease without esophagitis: Secondary | ICD-10-CM

## 2011-01-28 LAB — CBC WITH DIFFERENTIAL/PLATELET
Basophils Absolute: 0 10*3/uL (ref 0.0–0.1)
Basophils Relative: 0.7 % (ref 0.0–3.0)
Eosinophils Absolute: 0.2 10*3/uL (ref 0.0–0.7)
Lymphocytes Relative: 45.3 % (ref 12.0–46.0)
MCHC: 35.3 g/dL (ref 30.0–36.0)
Monocytes Absolute: 0.3 10*3/uL (ref 0.1–1.0)
Neutrophils Relative %: 45.3 % (ref 43.0–77.0)
Platelets: 244 10*3/uL (ref 150.0–400.0)
RBC: 3.84 Mil/uL — ABNORMAL LOW (ref 4.22–5.81)
RDW: 15.4 % — ABNORMAL HIGH (ref 11.5–14.6)

## 2011-01-28 LAB — HEPATIC FUNCTION PANEL
Alkaline Phosphatase: 32 U/L — ABNORMAL LOW (ref 39–117)
Bilirubin, Direct: 0.1 mg/dL (ref 0.0–0.3)
Total Bilirubin: 0.3 mg/dL (ref 0.3–1.2)

## 2011-01-28 LAB — POCT URINALYSIS DIPSTICK
Leukocytes, UA: NEGATIVE
Protein, UA: NEGATIVE
Urobilinogen, UA: 0.2
pH, UA: 5

## 2011-01-28 LAB — BASIC METABOLIC PANEL
BUN: 17 mg/dL (ref 6–23)
CO2: 24 mEq/L (ref 19–32)
Calcium: 8.7 mg/dL (ref 8.4–10.5)
Creatinine, Ser: 0.9 mg/dL (ref 0.4–1.5)

## 2011-01-28 LAB — LIPID PANEL
HDL: 53.7 mg/dL (ref >39.00)
Total CHOL/HDL Ratio: 7
Triglycerides: 1435 mg/dL — ABNORMAL HIGH (ref 0.0–149.0)
VLDL: 287 mg/dL — ABNORMAL HIGH (ref 0.0–40.0)

## 2011-01-28 LAB — H. PYLORI ANTIBODY, IGG: H Pylori IgG: NEGATIVE

## 2011-01-28 MED ORDER — DEXLANSOPRAZOLE 60 MG PO CPDR: 60.0000 mg | DELAYED_RELEASE_CAPSULE | Freq: Every day | ORAL | Status: AC

## 2011-01-28 MED ORDER — FENOFIBRATE MICRONIZED 130 MG PO CAPS: 130.0000 mg | ORAL_CAPSULE | Freq: Every day | ORAL | Status: DC

## 2011-01-28 MED ORDER — LISINOPRIL-HYDROCHLOROTHIAZIDE 20-12.5 MG PO TABS: 1.0000 | ORAL_TABLET | Freq: Every day | ORAL | Status: DC

## 2011-01-28 MED ORDER — CITALOPRAM HYDROBROMIDE 20 MG PO TABS: 20.0000 mg | ORAL_TABLET | Freq: Every day | ORAL | Status: DC

## 2011-01-28 NOTE — Progress Notes (Signed)
  Subjective:    Patient here for follow-up of elevated blood pressure.  He is not exercising and is not adherent to a low-salt diet.  Blood pressure is not well controlled at home. Cardiac symptoms: none. Patient denies: chest pain, dyspnea, fatigue, irregular heart beat and palpitations. Cardiovascular risk factors: dyslipidemia, hypertension, male gender, obesity (BMI >= 30 kg/m2) and sedentary lifestyle. Use of agents associated with hypertension: none. History of target organ damage: none.  The following portions of the patient's history were reviewed and updated as appropriate: allergies, current medications, past family history, past medical history, past social history, past surgical history and problem list.  Review of Systems Pertinent items are noted in HPI.     Objective:    BP 126/90  Pulse 61  Temp(Src) 98.6 F (37 C) (Oral)  Wt 198 lb (89.812 kg)  SpO2 97% General appearance: alert, cooperative, appears stated age and no distress Head: Normocephalic, without obvious abnormality, atraumatic Lungs: clear to auscultation bilaterally Heart: regular rate and rhythm, S1, S2 normal, no murmur, click, rub or gallop Extremities: extremities normal, atraumatic, no cyanosis or edema psych-- pt is under stress    mole==  R arm-- + flesh colored mole 3/4 cm Assessment:    Hypertension, stage 1 . Evidence of target organ damage: none.   Hyperlipidemia  Gerd ED Mole--refer to derm at his request Plan:    Medication: increase to lisinopril 20/ 12.5. Dietary sodium restriction. Regular aerobic exercise. Check blood pressures 2-3 times weekly and record. Follow up: 6 months and as needed.  Check labs today

## 2011-01-28 NOTE — Patient Instructions (Signed)

## 2011-02-02 ENCOUNTER — Telehealth: Payer: Self-pay

## 2011-02-02 NOTE — Telephone Encounter (Signed)
Message copied by Arnette Norris on Wed Feb 02, 2011  9:11 AM ------      Message from: Lelon Perla      Created: Sat Jan 29, 2011 10:23 AM       TG are very high---take antara and watch diet      Is pt having any abd pain?---if yes --check amylase and lipase      Recheck labs 3 months-----272.4  Lipid, hep

## 2011-02-02 NOTE — Telephone Encounter (Signed)
Discussed with patient and sent him a copy of diet to help lower triglycerides. Copy of labs mailed to patient---denied having any abdominal pain at this time. Will call back t schedule repeat labs   KP

## 2011-03-07 ENCOUNTER — Ambulatory Visit: Payer: Medicare Other | Admitting: Gastroenterology

## 2011-03-07 ENCOUNTER — Emergency Department (HOSPITAL_BASED_OUTPATIENT_CLINIC_OR_DEPARTMENT_OTHER)
Admission: EM | Admit: 2011-03-07 | Discharge: 2011-03-07 | Disposition: A | Discharge status: Home / Self Care | Payer: Managed Care, Other (non HMO) | Attending: Emergency Medicine | Admitting: Emergency Medicine

## 2011-03-07 ENCOUNTER — Encounter (HOSPITAL_BASED_OUTPATIENT_CLINIC_OR_DEPARTMENT_OTHER): Payer: Self-pay | Admitting: Family Medicine

## 2011-03-07 ENCOUNTER — Other Ambulatory Visit: Payer: Self-pay

## 2011-03-07 ENCOUNTER — Emergency Department (INDEPENDENT_AMBULATORY_CARE_PROVIDER_SITE_OTHER): Payer: Managed Care, Other (non HMO)

## 2011-03-07 DIAGNOSIS — J40 Bronchitis, not specified as acute or chronic: Secondary | ICD-10-CM | POA: Insufficient documentation

## 2011-03-07 DIAGNOSIS — R110 Nausea: Secondary | ICD-10-CM | POA: Insufficient documentation

## 2011-03-07 DIAGNOSIS — R05 Cough: Secondary | ICD-10-CM

## 2011-03-07 DIAGNOSIS — Z79899 Other long term (current) drug therapy: Secondary | ICD-10-CM | POA: Insufficient documentation

## 2011-03-07 DIAGNOSIS — E785 Hyperlipidemia, unspecified: Secondary | ICD-10-CM | POA: Insufficient documentation

## 2011-03-07 DIAGNOSIS — R079 Chest pain, unspecified: Secondary | ICD-10-CM | POA: Insufficient documentation

## 2011-03-07 DIAGNOSIS — I10 Essential (primary) hypertension: Secondary | ICD-10-CM | POA: Insufficient documentation

## 2011-03-07 LAB — BASIC METABOLIC PANEL
BUN: 20 mg/dL (ref 6–23)
Chloride: 102 mEq/L (ref 96–112)
GFR calc Af Amer: >60 mL/min (ref >60)
GFR calc non Af Amer: 57 mL/min — ABNORMAL LOW (ref >60)
Glucose, Bld: 100 mg/dL — ABNORMAL HIGH (ref 70–99)
Potassium: 3.9 mEq/L (ref 3.5–5.1)
Sodium: 138 mEq/L (ref 135–145)

## 2011-03-07 LAB — CARDIAC PANEL(CRET KIN+CKTOT+MB+TROPI)
CK, MB: 1.8 ng/mL (ref 0.3–4.0)
Relative Index: 1.1 (ref 0.0–2.5)
Troponin I: <0.3 ng/mL (ref <0.30)

## 2011-03-07 LAB — CBC
Hemoglobin: 12.6 g/dL — ABNORMAL LOW (ref 13.0–17.0)
Platelets: 306 10*3/uL (ref 150–400)
RBC: 4.09 MIL/uL — ABNORMAL LOW (ref 4.22–5.81)
WBC: 6.7 10*3/uL (ref 4.0–10.5)

## 2011-03-07 LAB — DIFFERENTIAL
Lymphs Abs: 2.3 10*3/uL (ref 0.7–4.0)
Monocytes Relative: 8 % (ref 3–12)
Neutro Abs: 3.8 10*3/uL (ref 1.7–7.7)
Neutrophils Relative %: 57 % (ref 43–77)

## 2011-03-07 MED ORDER — ALBUTEROL SULFATE HFA 108 (90 BASE) MCG/ACT IN AERS
2.0000 | INHALATION_SPRAY | RESPIRATORY_TRACT | Status: DC | PRN
  Administered 2011-03-07: 2 via RESPIRATORY_TRACT
  Filled 2011-03-07: qty 6.7

## 2011-03-07 MED ORDER — ASPIRIN 81 MG PO CHEW
324.0000 mg | CHEWABLE_TABLET | Freq: Once | ORAL | Status: AC
  Administered 2011-03-07: 324 mg via ORAL
  Filled 2011-03-07: qty 1

## 2011-03-07 NOTE — ED Provider Notes (Signed)
Scribed for [employer]. Bernette Mayers, MD, the patient was seen in room 6. This chart was scribed by Jannette Fogo. This patient's care was started at 18:09.   CSN: [id number] Arrival date & time: 03/07/2011  5:41 PM  Chief Complaint  Patient presents with  . Chest Pain   HPI Dennis Moore is a 55 y.o. male with a history of hypertension, hyperlipidemia, hypercholesterolemia, and bronchitis who presents to the Emergency Department complaining of 3 days of cough and chest tightness. Patient started coughing on Saturday 03/05/11, then that evening he developed chest tightness. States chest tightness is located anteriorly and it radiates to both sides. He reports associated nausea, congestion, rhinorrhea, post nasal drip, diaphoresis, and a "sinus headache" today. Patient denies any fever, pedal edema, tobacco use, or recent travel. He reports a history of an irregular heart beat but his last stress test was in 1995. Patient has a family history of cardiac disease, his father had an AMI at age 55 and his mother had an AMI at age 74. There are no other associated symptoms and no other alleviating or aggravating factors.   HPI ELEMENTS:  Location: chest  Onset: 03/05/11 Duration: 3 days  Context: as above  Associated symptoms: as above.    Past Medical History  Diagnosis Date  . Depression   . Hyperlipidemia   . Hypertension   . Erectile dysfunction   . Tubular adenoma of colon 11/2008  . Internal hemorrhoids   . BPH (benign prostatic hypertrophy)   . Allergic rhinitis     Past Surgical History  Procedure Date  . Appendectomy   . Elbow surgery   . Wrist surgery   . Inguinal hernia repair    MEDICATIONS:  Previous Medications   CITALOPRAM (CELEXA) 20 MG TABLET    Take 1 tablet (20 mg total) by mouth daily.   FENOFIBRATE MICRONIZED (ANTARA) 130 MG CAPSULE    Take 1 capsule (130 mg total) by mouth daily before breakfast.   FLUTICASONE (FLONASE) 50 MCG/ACT NASAL SPRAY    2 sprays by Nasal  route daily.   FLUTICASONE (VERAMYST) 27.5 MCG/SPRAY NASAL SPRAY    2 sprays by Nasal route daily.     HYDROCODONE-ACETAMINOPHEN (VICODIN ES) 7.5-750 MG PER TABLET    Take 1 tablet by mouth every 6 (six) hours as needed for pain.   LISINOPRIL-HYDROCHLOROTHIAZIDE (PRINZIDE,ZESTORETIC) 20-12.5 MG PER TABLET    Take 1 tablet by mouth daily.   LORATADINE (CLARITIN) 10 MG TABLET    Take 10 mg by mouth daily.     ROSUVASTATIN (CRESTOR) 20 MG TABLET    Take 20 mg by mouth daily.     SILDENAFIL (VIAGRA) 100 MG TABLET    Take 100 mg by mouth daily as needed.      ALLERGIES:  Allergies as of 03/07/2011 - Review Complete 03/07/2011  Allergen Reaction Noted  . Penicillins Swelling and Rash 12/21/2006    Family History  Problem Relation Age of Onset  . Coronary artery disease    . Alzheimer's disease    . Parkinsonism Father   . Hyperlipidemia Father   . Hypertension Father   . Hyperlipidemia Mother   +Cardiac disease, his father had an AMI at age 56 and his mother had an AMI at age 56.   SOCIAL HISTORY: Arrives alone  History  Substance Use Topics  . Smoking status: Never Smoker   . Smokeless tobacco: Never Used  . Alcohol Use: Yes    Review of Systems  Constitutional: Negative  for fever.  HENT: Positive for congestion, rhinorrhea and postnasal drip.   Respiratory: Positive for cough and chest tightness.   Cardiovascular: Negative for leg swelling.  Gastrointestinal: Positive for nausea.  Neurological: Positive for headaches (due to sinus pain).  All other systems reviewed and are negative.    Physical Exam  BP 150/69  Pulse 70  Temp(Src) 98.1 F (36.7 C) (Oral)  Resp 20  Wt 188 lb (85.276 kg)  SpO2 100%  Physical Exam  Constitutional: He is oriented to person, place, and time. He appears well-developed and well-nourished. No distress.  HENT:  Head: Normocephalic and atraumatic.  Mouth/Throat: Oropharynx is clear and moist.  Eyes: Conjunctivae are normal. Pupils are  equal, round, and reactive to light.  Neck: Normal range of motion. Neck supple.  Cardiovascular: Normal rate, regular rhythm, normal heart sounds and intact distal pulses.   No murmur heard. Pulmonary/Chest: Effort normal and breath sounds normal. No respiratory distress. He has no wheezes. He has no rales.  Abdominal: Soft. Bowel sounds are normal. He exhibits no distension. There is no tenderness.  Musculoskeletal: Normal range of motion. He exhibits no edema and no tenderness.  Neurological: He is alert and oriented to person, place, and time. Coordination normal.  Skin: Skin is warm and dry. No rash noted.  Psychiatric: He has a normal mood and affect.    Procedures  OTHER DATA REVIEWED: Nursing notes, vital signs, and past medical records reviewed.  DIAGNOSTIC STUDIES: Oxygen Saturation is 100% on room air, normal by my interpretation.    LABS / RADIOLOGY:  Results for orders placed during the hospital encounter of 03/07/11  CBC      Component Value Range   WBC 6.7  4.0 - 10.5 (K/uL)   RBC 4.09 (*) 4.22 - 5.81 (MIL/uL)   Hemoglobin 12.6 (*) 13.0 - 17.0 (g/dL)   HCT 40.9 (*) 81.1 - 52.0 (%)   MCV 87.5  78.0 - 100.0 (fL)   MCH 30.8  26.0 - 34.0 (pg)   MCHC 35.2  30.0 - 36.0 (g/dL)   RDW 91.4  78.2 - 95.6 (%)   Platelets 306  150 - 400 (K/uL)  DIFFERENTIAL      Component Value Range   Neutrophils Relative 57  43 - 77 (%)   Neutro Abs 3.8  1.7 - 7.7 (K/uL)   Lymphocytes Relative 34  12 - 46 (%)   Lymphs Abs 2.3  0.7 - 4.0 (K/uL)   Monocytes Relative 8  3 - 12 (%)   Monocytes Absolute 0.5  0.1 - 1.0 (K/uL)   Eosinophils Relative 1  0 - 5 (%)   Eosinophils Absolute 0.1  0.0 - 0.7 (K/uL)   Basophils Relative 0  0 - 1 (%)   Basophils Absolute 0.0  0.0 - 0.1 (K/uL)  BASIC METABOLIC PANEL      Component Value Range   Sodium 138  135 - 145 (mEq/L)   Potassium 3.9  3.5 - 5.1 (mEq/L)   Chloride 102  96 - 112 (mEq/L)   CO2 25  19 - 32 (mEq/L)   Glucose, Bld 100 (*) 70 - 99  (mg/dL)   BUN 20  6 - 23 (mg/dL)   Creatinine, Ser 2.13  0.50 - 1.35 (mg/dL)   Calcium 9.7  8.4 - 08.6 (mg/dL)   GFR calc non Af Amer 57 (*) >60 (mL/min)   GFR calc Af Amer >60  >60 (mL/min)  CARDIAC PANEL(CRET KIN+CKTOT+MB+TROPI)      Component Value  Range   Total CK 157  7 - 232 (U/L)   CK, MB 1.8  0.3 - 4.0 (ng/mL)   Troponin I <0.30  <0.30 (ng/mL)   Relative Index 1.1  0.0 - 2.5    EKG  Date: 03/07/2011  Rate: 68  Rhythm: normal sinus rhythm  QRS Axis: right  Intervals: normal  ST/T Wave abnormalities: normal and incomplete RBBB  Conduction Disutrbances:iRBBB  Narrative Interpretation:   Old EKG Reviewed: Incomplete RBBB is new from 02/09/2009  CXR: 2 View; Interpreted by Radiologist and reviewed by me: Normal chest. Original Report Authenticated By: Harrel Lemon, M.D.  ED COURSE / COORDINATION OF CARE: 18:10  - Patient evaluated by ED physician; labs and CXR ordered.    MDM: CP with dry cough, suspect this is bronchitis, doubt ACS/CAD given 2 day history and neg cardiac markers. Will treat with inhaler, OTC cough medications and PCP followup. Advised to return for any worsening symptoms or for any concerns.   IMPRESSION:  bronchitis  SCRIBE ATTESTATION: I personally performed the services described in the documentation, which were scribed in my presence. The recorded information has been reviewed and considered.   Zymere Patlan B.

## 2011-03-07 NOTE — ED Notes (Signed)
Pt c/o "tightness in chest" that started yesterday. Pt also c/o n/v, lightheadedness. Pt sts he has had a cough and drainage also.

## 2011-03-07 NOTE — ED Notes (Signed)
Pt states he began having cough productive of clear sputum this past Saturday.  Pt states yesterday he began having chest tightness.  Pt is coughing on exam and coughs when asked to take a deep breath.  Pt denies SOB, dizziness or N/V.  Pt denies pain.

## 2011-03-11 ENCOUNTER — Other Ambulatory Visit: Payer: Self-pay | Admitting: Family Medicine

## 2011-04-01 ENCOUNTER — Ambulatory Visit (INDEPENDENT_AMBULATORY_CARE_PROVIDER_SITE_OTHER): Payer: Managed Care, Other (non HMO) | Admitting: Family Medicine

## 2011-04-01 ENCOUNTER — Ambulatory Visit (HOSPITAL_BASED_OUTPATIENT_CLINIC_OR_DEPARTMENT_OTHER)
Admit: 2011-04-01 | Discharge: 2011-04-01 | Disposition: A | Discharge status: Home / Self Care | Payer: Managed Care, Other (non HMO) | Attending: Family Medicine | Admitting: Family Medicine

## 2011-04-01 ENCOUNTER — Encounter: Payer: Self-pay | Admitting: Family Medicine

## 2011-04-01 ENCOUNTER — Emergency Department (INDEPENDENT_AMBULATORY_CARE_PROVIDER_SITE_OTHER)
Admission: EM | Admit: 2011-04-01 | Disposition: A | Discharge status: Home / Self Care | Payer: Managed Care, Other (non HMO) | Attending: Family Medicine | Admitting: Family Medicine

## 2011-04-01 VITALS — BP 148/90 | HR 69 | Temp 98.3°F | Wt 191.4 lb

## 2011-04-01 DIAGNOSIS — M545 Low back pain, unspecified: Secondary | ICD-10-CM | POA: Insufficient documentation

## 2011-04-01 DIAGNOSIS — E785 Hyperlipidemia, unspecified: Secondary | ICD-10-CM

## 2011-04-01 DIAGNOSIS — M549 Dorsalgia, unspecified: Secondary | ICD-10-CM

## 2011-04-01 MED ORDER — FENOFIBRATE MICRONIZED 130 MG PO CAPS: 130.0000 mg | ORAL_CAPSULE | Freq: Every day | ORAL | Status: DC

## 2011-04-01 MED ORDER — TRAMADOL HCL 50 MG PO TABS: 50.0000 mg | ORAL_TABLET | Freq: Four times a day (QID) | ORAL | Status: DC | PRN

## 2011-04-01 MED ORDER — CYCLOBENZAPRINE HCL 10 MG PO TABS: 10.0000 mg | ORAL_TABLET | Freq: Three times a day (TID) | ORAL | Status: DC | PRN

## 2011-04-01 NOTE — Patient Instructions (Signed)
Back Pain & Injury Your back pain is most likely caused by a strain of the muscles or ligaments supporting the spine. Back strains cause pain and trouble moving because of muscle spasms. They may take several weeks to heal. Usually they are better in days.  Treatment for back pain includes:  Rest - Get bed rest as needed over the next day or two. Use a firm mattress and lie on your side with your knees slightly bent. If you lie on your back, put a pillow under your knees.   Early movement - Back pain improves most rapidly if you remain active. It is much more stressful on the back to sit or stand in one place. Do not sit, drive or stand in one place for more than 30 minutes at a time. Take short walks on level surfaces as soon as pain allows.   Limit bending and lifting - Do not bend over or lift anything over 20 pounds until instructed otherwise. Lift by bending your knees. Use your leg muscles to help. Keep the load close to your body and avoid twisting. Do not reach or do overhead work.   Medicines - Medicine to reduce pain and inflammation are helpful. Muscle-relaxing drugs may be prescribed.   Therapy - Put ice packs on your back every few hours for the first 2-3 days after your injury or as instructed. After that ice or heat may be alternated to reduce pain and spasm. Back exercises and gentle massage may be of some benefit. You should be examined again if your back pain is not better in one week.  SEEK IMMEDIATE MEDICAL CARE IF:  You have pain that radiates from your back into your legs.   You develop new bowel or bladder control problems.   You have unusual weakness or numbness in your arms or legs.   You develop nausea or vomiting.   You develop abdominal pain.   You feel faint.  Document Released: 06/27/2005 Document Re-Released: 04/05/2008 [employer] Patient Information 2011 [employer]. 

## 2011-04-01 NOTE — Progress Notes (Signed)
  Subjective:    Dennis Moore is a 55 y.o. male who presents for evaluation of low back pain. The patient has had recurrent self limited episodes of low back pain in the past. Symptoms have been present for 7 days and are gradually worsening.  Onset was related to / precipitated by no known injury. The pain is located in the across the lower back and does not radiate. The pain is described as throbbing and occurs intermittently. He rates his pain as severe. Symptoms are exacerbated by nothing in particular. Symptoms are improved by nothing. He has also tried nothing which provided no symptom relief. He has no other symptoms associated with the back pain. The patient has no "red flag" history indicative of complicated back pain. Pain today is only on right side.  Pt has tried otc with no relief.   The following portions of the patient's history were reviewed and updated as appropriate: allergies, current medications, past family history, past medical history, past social history, past surgical history and problem list.  Review of Systems Pertinent items are noted in HPI.    Objective:   Full range of motion without pain, no tenderness, no spasm, no curvature. Normal reflexes, gait, strength and negative straight-leg raise.  pt has dec rom of hips generally---no worse than usual  Assessment:    Nonspecific acute low back pain    Plan:    Natural history and expected course discussed. Questions answered. Agricultural engineer distributed. Stretching exercises discussed. Short (2-4 day) period of relative rest recommended until acute symptoms improve. Ice to affected area as needed for local pain relief. Heat to affected area as needed for local pain relief. Muscle relaxants per medication orders.

## 2011-04-06 ENCOUNTER — Telehealth: Payer: Self-pay

## 2011-04-06 DIAGNOSIS — M549 Dorsalgia, unspecified: Secondary | ICD-10-CM

## 2011-04-06 NOTE — Telephone Encounter (Signed)
Message copied by Arnette Norris on Wed Apr 06, 2011  3:25 PM ------      Message from: Lelon Perla      Created: Tue Apr 05, 2011  9:17 AM       Refer to PT

## 2011-04-06 NOTE — Telephone Encounter (Signed)
VM left making patient aware Pt referral has bene put in and someone will contact him with appt.     KP

## 2011-04-14 ENCOUNTER — Encounter: Payer: Self-pay | Admitting: Family Medicine

## 2011-04-14 ENCOUNTER — Ambulatory Visit (INDEPENDENT_AMBULATORY_CARE_PROVIDER_SITE_OTHER): Payer: Managed Care, Other (non HMO) | Admitting: Family Medicine

## 2011-04-14 VITALS — BP 132/94 | HR 68 | Temp 98.6°F | Wt 188.0 lb

## 2011-04-14 DIAGNOSIS — M549 Dorsalgia, unspecified: Secondary | ICD-10-CM

## 2011-04-14 DIAGNOSIS — N529 Male erectile dysfunction, unspecified: Secondary | ICD-10-CM

## 2011-04-14 DIAGNOSIS — Z23 Encounter for immunization: Secondary | ICD-10-CM

## 2011-04-14 MED ORDER — CYCLOBENZAPRINE HCL 10 MG PO TABS: 10.0000 mg | ORAL_TABLET | Freq: Three times a day (TID) | ORAL | Status: DC | PRN

## 2011-04-14 MED ORDER — CITALOPRAM HYDROBROMIDE 20 MG PO TABS: 20.0000 mg | ORAL_TABLET | Freq: Every day | ORAL | Status: DC

## 2011-04-14 MED ORDER — SILDENAFIL CITRATE 100 MG PO TABS: 100.0000 mg | ORAL_TABLET | Freq: Every day | ORAL | Status: DC | PRN

## 2011-04-14 NOTE — Progress Notes (Signed)
  Subjective:    Dennis Moore is a 55 y.o. male who presents for evaluation of low back pain. The patient has had recurrent self limited episodes of low back pain in the past. Symptoms have been present for 1 day and are gradually worsening.  Onset was related to / precipitated by a fall. The pain is located in the across the lower back and does not radiate. The pain is described as stiffness and throbbing and occurs all day. He rates his pain as moderate. Symptoms are exacerbated by standing, straining and twisting. Symptoms are improved by nothing. He has also tried nothing which provided no symptom relief. He has no other symptoms associated with the back pain. The patient has no "red flag" history indicative of complicated back pain.  The following portions of the patient's history were reviewed and updated as appropriate: allergies, current medications, past family history, past medical history, past social history, past surgical history and problem list.  Review of Systems Pertinent items are noted in HPI.    Objective:   Inspection and palpation: inspection of back is normal. Muscle tone and ROM exam: full range of motion with pain, antalgic gait, . Straight leg raise: negative at 90 degrees bilaterally. Neurological: normal DTRs, muscle strength and reflexes.    Assessment:    Nonspecific acute low back pain    Plan:    Natural history and expected course discussed. Questions answered. Proper lifting, bending technique discussed. Short (2-4 day) period of relative rest recommended until acute symptoms improve. Ice to affected area as needed for local pain relief. Heat to affected area as needed for local pain relief. Muscle relaxants per medication orders. Follow-up in 2 weeks. --- if needed

## 2011-04-14 NOTE — Patient Instructions (Signed)
Back Pain & Injury Your back pain is most likely caused by a strain of the muscles or ligaments supporting the spine. Back strains cause pain and trouble moving because of muscle spasms. They may take several weeks to heal. Usually they are better in days.  Treatment for back pain includes:  Rest - Get bed rest as needed over the next day or two. Use a firm mattress and lie on your side with your knees slightly bent. If you lie on your back, put a pillow under your knees.   Early movement - Back pain improves most rapidly if you remain active. It is much more stressful on the back to sit or stand in one place. Do not sit, drive or stand in one place for more than 30 minutes at a time. Take short walks on level surfaces as soon as pain allows.   Limit bending and lifting - Do not bend over or lift anything over 20 pounds until instructed otherwise. Lift by bending your knees. Use your leg muscles to help. Keep the load close to your body and avoid twisting. Do not reach or do overhead work.   Medicines - Medicine to reduce pain and inflammation are helpful. Muscle-relaxing drugs may be prescribed.   Therapy - Put ice packs on your back every few hours for the first 2-3 days after your injury or as instructed. After that ice or heat may be alternated to reduce pain and spasm. Back exercises and gentle massage may be of some benefit. You should be examined again if your back pain is not better in one week.  SEEK IMMEDIATE MEDICAL CARE IF:  You have pain that radiates from your back into your legs.   You develop new bowel or bladder control problems.   You have unusual weakness or numbness in your arms or legs.   You develop nausea or vomiting.   You develop abdominal pain.   You feel faint.  Document Released: 06/27/2005 Document Re-Released: 04/05/2008 [employer] Patient Information 2011 [employer]. 

## 2011-04-28 ENCOUNTER — Encounter: Payer: Self-pay | Admitting: Family Medicine

## 2011-04-28 ENCOUNTER — Ambulatory Visit (INDEPENDENT_AMBULATORY_CARE_PROVIDER_SITE_OTHER): Payer: Managed Care, Other (non HMO) | Admitting: Family Medicine

## 2011-04-28 ENCOUNTER — Ambulatory Visit: Payer: Managed Care, Other (non HMO) | Attending: Family Medicine | Admitting: Physical Therapy

## 2011-04-28 DIAGNOSIS — R590 Localized enlarged lymph nodes: Secondary | ICD-10-CM | POA: Insufficient documentation

## 2011-04-28 DIAGNOSIS — R599 Enlarged lymph nodes, unspecified: Secondary | ICD-10-CM

## 2011-04-28 NOTE — Progress Notes (Signed)
  Subjective:    Patient ID: Dennis Moore, male    DOB: 1956/05/15, 55 y.o.   MRN: [id number]  HPI Tender LN- L groin, sxs started 3 days ago.  Pt was concerned b/c wife was recently seen and dx'd w/ BV.  Denies break in skin, rash, known infxn, penile dc, fevers, chills, diarrhea.  Reports area is much improved today.   Review of Systems For ROS see HPI     Objective:   Physical Exam  Vitals reviewed. Constitutional: He appears well-developed and well-nourished. No distress.  Lymphadenopathy:       Left: Inguinal (<1cm shotty, freely mobile, nontender node w/out overlying erythema or skin changes) adenopathy present.          Assessment & Plan:

## 2011-04-28 NOTE — Assessment & Plan Note (Signed)
Likely reactive.  No obvious infection.  Pt reports this is spontaneously improving.  Reviewed supportive care and red flags that should prompt return.  Pt expressed understanding and is in agreement w/ plan.

## 2011-05-10 ENCOUNTER — Encounter: Payer: Self-pay | Admitting: Internal Medicine

## 2011-05-10 ENCOUNTER — Ambulatory Visit (INDEPENDENT_AMBULATORY_CARE_PROVIDER_SITE_OTHER): Payer: Managed Care, Other (non HMO) | Admitting: Internal Medicine

## 2011-05-10 VITALS — BP 156/80 | HR 89 | Temp 98.7°F | Resp 20 | Wt 192.1 lb

## 2011-05-10 DIAGNOSIS — J069 Acute upper respiratory infection, unspecified: Secondary | ICD-10-CM

## 2011-05-10 MED ORDER — AZITHROMYCIN 250 MG PO TABS: ORAL_TABLET | ORAL | Status: AC

## 2011-05-10 NOTE — Progress Notes (Signed)
  Subjective:    Patient ID: Dennis Moore, male    DOB: [birth year], 55 y.o.   MRN: [id number]  HPI Developed postnasal drip in 4 days ago. Yesterday he had subjective fever and some chills as well as frontal sinus congestion and pain, reports that these symptoms usually means sinusitis to him. He was exposed to his granddaughter which had a cold.  PMH: reviewed  MED LIST: reviewed   Review of Systems No cough Had sore throat 2 days ago but that resolved No nausea vomiting, no myalgias    Objective:   Physical Exam  Constitutional: He appears well-developed and well-nourished. No distress.  HENT:  Head: Normocephalic and atraumatic.  Right Ear: External ear normal.       L ear w/ wax. Face:symmetric, nontender to palpation. Nose : mild congestion, no d/c  Neck: Normal range of motion. Neck supple.  Cardiovascular: Normal rate, regular rhythm and normal heart sounds.   No murmur heard. Pulmonary/Chest: Effort normal and breath sounds normal. No respiratory distress. He has no wheezes. He has no rales.  Skin: He is not diaphoretic.          Assessment & Plan:  URI: See instructions

## 2011-05-10 NOTE — Patient Instructions (Signed)
Rest, fluids , tylenol If cough, take Mucinex DM twice a day as needed  If congestion: use OTC Benadryl and astepro 2 sprays on each side of the nose at night  If  no better in a  few days: start a zpack Call anytime if the symptoms are severe

## 2011-06-15 ENCOUNTER — Ambulatory Visit (INDEPENDENT_AMBULATORY_CARE_PROVIDER_SITE_OTHER): Payer: Managed Care, Other (non HMO) | Admitting: Family Medicine

## 2011-06-15 ENCOUNTER — Encounter: Payer: Self-pay | Admitting: Family Medicine

## 2011-06-15 VITALS — BP 126/80 | HR 59 | Temp 97.6°F | Wt 196.8 lb

## 2011-06-15 DIAGNOSIS — J329 Chronic sinusitis, unspecified: Secondary | ICD-10-CM

## 2011-06-15 DIAGNOSIS — J019 Acute sinusitis, unspecified: Secondary | ICD-10-CM

## 2011-06-15 MED ORDER — CLARITHROMYCIN ER 500 MG PO TB24: 1000.0000 mg | ORAL_TABLET | Freq: Every day | ORAL | Status: DC

## 2011-06-15 MED ORDER — AZELASTINE-FLUTICASONE 137-50 MCG/ACT NA SUSP: 1.0000 | Freq: Two times a day (BID) | NASAL | Status: DC

## 2011-06-15 NOTE — Progress Notes (Signed)
  Subjective:     Dennis Moore is a 55 y.o. male who presents for evaluation of sinus pain. Symptoms include: congestion, headaches, nasal congestion, sinus pressure and sore throat. Onset of symptoms was 1 week ago. Symptoms have been gradually worsening since that time. Past history is significant for no history of pneumonia or bronchitis. Patient is a non-smoker.  The following portions of the patient's history were reviewed and updated as appropriate: allergies, current medications, past family history, past medical history, past social history, past surgical history and problem list.  Review of Systems Pertinent items are noted in HPI.   Objective:    BP 126/80  Pulse 59  Temp(Src) 97.6 F (36.4 C) (Oral)  Wt 196 lb 12.8 oz (89.268 kg)  SpO2 98% General appearance: alert, cooperative, appears stated age and no distress Ears: normal TM's and external ear canals both ears Nose: green discharge, mild congestion, sinus tenderness bilateral Throat: lips, mucosa, and tongue normal; teeth and gums normal Lungs: clear to auscultation bilaterally Lymph nodes: Cervical adenopathy: adenopathy    Assessment:    Acute bacterial sinusitis.    Plan:    Nasal steroids per medication orders. Antihistamines per medication orders. Biaxin per medication orders. f/u prn

## 2011-06-15 NOTE — Patient Instructions (Signed)

## 2011-07-03 ENCOUNTER — Emergency Department (INDEPENDENT_AMBULATORY_CARE_PROVIDER_SITE_OTHER): Payer: Managed Care, Other (non HMO)

## 2011-07-03 ENCOUNTER — Observation Stay (HOSPITAL_BASED_OUTPATIENT_CLINIC_OR_DEPARTMENT_OTHER)
Admission: EM | Admit: 2011-07-03 | Discharge: 2011-07-04 | Disposition: A | Discharge status: Home / Self Care | Payer: Managed Care, Other (non HMO) | Attending: Emergency Medicine | Admitting: Emergency Medicine

## 2011-07-03 ENCOUNTER — Encounter (HOSPITAL_BASED_OUTPATIENT_CLINIC_OR_DEPARTMENT_OTHER): Payer: Self-pay | Admitting: *Deleted

## 2011-07-03 ENCOUNTER — Other Ambulatory Visit: Payer: Self-pay

## 2011-07-03 DIAGNOSIS — S61409A Unspecified open wound of unspecified hand, initial encounter: Secondary | ICD-10-CM | POA: Insufficient documentation

## 2011-07-03 DIAGNOSIS — Y92009 Unspecified place in unspecified non-institutional (private) residence as the place of occurrence of the external cause: Secondary | ICD-10-CM | POA: Insufficient documentation

## 2011-07-03 DIAGNOSIS — R079 Chest pain, unspecified: Principal | ICD-10-CM | POA: Insufficient documentation

## 2011-07-03 DIAGNOSIS — W268XXA Contact with other sharp object(s), not elsewhere classified, initial encounter: Secondary | ICD-10-CM | POA: Insufficient documentation

## 2011-07-03 LAB — CBC
HCT: 35 % — ABNORMAL LOW (ref 39.0–52.0)
MCHC: 34.6 g/dL (ref 30.0–36.0)
RDW: 14.6 % (ref 11.5–15.5)
WBC: 7.1 10*3/uL (ref 4.0–10.5)

## 2011-07-03 LAB — TROPONIN I: Troponin I: <0.3 ng/mL (ref <0.30)

## 2011-07-03 LAB — BASIC METABOLIC PANEL
BUN: 20 mg/dL (ref 6–23)
Chloride: 105 mEq/L (ref 96–112)
Creatinine, Ser: 1.6 mg/dL — ABNORMAL HIGH (ref 0.50–1.35)
GFR calc Af Amer: 54 mL/min — ABNORMAL LOW (ref >90)
GFR calc non Af Amer: 47 mL/min — ABNORMAL LOW (ref >90)
Potassium: 3.4 mEq/L — ABNORMAL LOW (ref 3.5–5.1)

## 2011-07-03 MED ORDER — ASPIRIN 81 MG PO CHEW
324.0000 mg | CHEWABLE_TABLET | Freq: Once | ORAL | Status: AC
  Administered 2011-07-03: 324 mg via ORAL
  Filled 2011-07-03: qty 4

## 2011-07-03 NOTE — ED Notes (Signed)
Patient started having chest pressure last night, states that sometimes he has sharp shooting pains in his left chest. "has had episodes where he can feel his heart stop beating in his sleep".  He decided to come to the ER after he cut his finger washing dishes. Right pointer finger, bleeding controlled at this time.

## 2011-07-03 NOTE — ED Provider Notes (Addendum)
History   This chart was scribed for Doug Sou, MD by Sofie Rower. The patient was seen in room MH04/MH04 and the patient's care was started at 10:47PM.    CSN: [id number]  Arrival date & time 07/03/11  2127   First MD Initiated Contact with Patient 07/03/11 2237      Chief Complaint  Patient presents with  . Chest Pain    (Consider location/radiation/quality/duration/timing/severity/associated sxs/prior treatment) HPI  Dennis Moore is a 55 y.o. male who presents to the Emergency Department complaining of moderate, intermittant chest pain located in his left chest onset last night with associated symptoms of numbness in his pinky finger, and shortness of breath. Pt states that the last three incidents generated a sharp shooting pain in his chest. Modifying factors include standing, twisting which moderately relives the pain. Pt has a history of high BP, high cholesterol. Pt parents both passed away from heart attacks. Pt last cardiologist appointment was five years ago, diagnosed with irregular heartbeat. PCP is Dr. Alvy Bimler. Pt also complains of laceration on his right hand occurred while washing dishes tonight suffered laceration in web space between middle and fourth fingers. Pt denies sweats, vomiting, smoking and illegal drug use.  Past Medical History  Diagnosis Date  . Depression   . Hyperlipidemia   . Hypertension   . Erectile dysfunction   . Tubular adenoma of colon 11/2008  . Internal hemorrhoids   . BPH (benign prostatic hypertrophy)   . Allergic rhinitis     Past Surgical History  Procedure Date  . Appendectomy   . Elbow surgery   . Wrist surgery   . Inguinal hernia repair     Family History  Problem Relation Age of Onset  . Coronary artery disease    . Alzheimer's disease    . Parkinsonism Father   . Hyperlipidemia Father   . Hypertension Father   . Hyperlipidemia Mother     History  Substance Use Topics  . Smoking status: Never Smoker   .  Smokeless tobacco: Never Used  . Alcohol Use: Yes      Review of Systems  10 Systems reviewed and are negative for acute change except as noted in the HPI.   Allergies  Penicillins  Home Medications   Current Outpatient Rx  Name Route Sig Dispense Refill  . FENOFIBRATE MICRONIZED 130 MG PO CAPS Oral Take 1 capsule (130 mg total) by mouth daily before breakfast. 30 capsule 2  . LISINOPRIL-HYDROCHLOROTHIAZIDE 20-12.5 MG PO TABS Oral Take 1 tablet by mouth daily. 90 tablet 3  . ROSUVASTATIN CALCIUM 20 MG PO TABS Oral Take 20 mg by mouth daily.        BP 144/88  Pulse 69  Temp(Src) 98.9 F (37.2 C) (Oral)  Resp 18  SpO2 98%  Physical Exam  Nursing note and vitals reviewed. Constitutional: He is oriented to person, place, and time. He appears well-developed and well-nourished. No distress.  HENT:  Head: Normocephalic and atraumatic.  Nose: Nose normal.  Eyes: EOM are normal. Pupils are equal, round, and reactive to light.  Neck: Normal range of motion. Neck supple. No tracheal deviation present.  Cardiovascular: Normal rate.   Pulmonary/Chest: Effort normal. No respiratory distress.  Abdominal: Soft. He exhibits no distension.  Musculoskeletal: Normal range of motion. He exhibits no edema.       Laceration between third and fourth finger on right hand.  Neurological: He is alert and oriented to person, place, and time. No sensory deficit.  Skin:  Skin is warm and dry.  Psychiatric: He has a normal mood and affect. His behavior is normal.    ED Course  Procedures (including critical care time)  DIAGNOSTIC STUDIES: Oxygen Saturation is 98% on room air, normal by my interpretation.    COORDINATION OF CARE:    Results for orders placed during the hospital encounter of 03/07/11  CBC      Component Value Range   WBC 6.7  4.0 - 10.5 (K/uL)   RBC 4.09 (*) 4.22 - 5.81 (MIL/uL)   Hemoglobin 12.6 (*) 13.0 - 17.0 (g/dL)   HCT 16.1 (*) 09.6 - 52.0 (%)   MCV 87.5  78.0 -  100.0 (fL)   MCH 30.8  26.0 - 34.0 (pg)   MCHC 35.2  30.0 - 36.0 (g/dL)   RDW 04.5  40.9 - 81.1 (%)   Platelets 306  150 - 400 (K/uL)  DIFFERENTIAL      Component Value Range   Neutrophils Relative 57  43 - 77 (%)   Neutro Abs 3.8  1.7 - 7.7 (K/uL)   Lymphocytes Relative 34  12 - 46 (%)   Lymphs Abs 2.3  0.7 - 4.0 (K/uL)   Monocytes Relative 8  3 - 12 (%)   Monocytes Absolute 0.5  0.1 - 1.0 (K/uL)   Eosinophils Relative 1  0 - 5 (%)   Eosinophils Absolute 0.1  0.0 - 0.7 (K/uL)   Basophils Relative 0  0 - 1 (%)   Basophils Absolute 0.0  0.0 - 0.1 (K/uL)  BASIC METABOLIC PANEL      Component Value Range   Sodium 138  135 - 145 (mEq/L)   Potassium 3.9  3.5 - 5.1 (mEq/L)   Chloride 102  96 - 112 (mEq/L)   CO2 25  19 - 32 (mEq/L)   Glucose, Bld 100 (*) 70 - 99 (mg/dL)   BUN 20  6 - 23 (mg/dL)   Creatinine, Ser 9.14  0.50 - 1.35 (mg/dL)   Calcium 9.7  8.4 - 78.2 (mg/dL)   GFR calc non Af Amer 57 (*) >60 (mL/min)   GFR calc Af Amer >60  >60 (mL/min)  CARDIAC PANEL(CRET KIN+CKTOT+MB+TROPI)      Component Value Range   Total CK 157  7 - 232 (U/L)   CK, MB 1.8  0.3 - 4.0 (ng/mL)   Troponin I <0.30  <0.30 (ng/mL)   Relative Index 1.1  0.0 - 2.5    No results found.   Date: 07/04/2011  Rate: 65  Rhythm: normal sinus rhythm  QRS Axis: normal  Intervals: normal  ST/T Wave abnormalities: normal  Conduction Disutrbances:none  Narrative Interpretation:   Old EKG Reviewed: unchanged   Unchanged from 03/07/2011 MDM  Lacerations not require repair. Patient last had tetanus immunization less than 10 years ago. Patient tells a typical story for angina however multiple cardiac risk factors including family history hyperlipidemia hypertension Plan transfer to [facility] CDU . Patient moderate to low risk pretest clinical probability for acute coronary syndrome Diagnosis #1 chest pain #2 laceration right hand 10:51PM- EDP at bedside discusses treatment plan.  12:30 a.m.Marland Kitchen Remains chest  pain-free I personally performed the services described in this documentation, which was scribed in my presence. The recorded information has been reviewed and considered.       Doug Sou, MD 07/04/11 [id number]  Doug Sou, MD 07/04/11 [phone]  Patient has arrived at [facility] to CDU. He relates that he did have an episode of  chest pain in the ambulance coming over to the emergency department. He's also concerned about the laceration on his hand and when it is examined, there is gaping of the skin edges and I do feel it will require a repair. Because of recurrent chest pain, cardiac enzymes will need to be cycled and a CT angiogram will be obtained in the morning.  0700: No further chest pains. Laceration was repaired by Frederich Chick PA-C. Repeat cardiac markers and repeat ECG are unremarkable. Cardiac CT is pending.   Date: 07/04/2011  Rate: 50  Rhythm: sinus bradycardia  QRS Axis: normal  Intervals: normal  ST/T Wave abnormalities: normal  Conduction Disutrbances:nonspecific intraventricular conduction delay  Narrative Interpretation: Nonspecific intraventricular conduction delay, unchanged from ECG of 07/03/2011  Old EKG Reviewed: unchanged    Dione Booze, MD 07/04/11 [phone]

## 2011-07-04 ENCOUNTER — Encounter (HOSPITAL_BASED_OUTPATIENT_CLINIC_OR_DEPARTMENT_OTHER): Payer: Self-pay | Admitting: *Deleted

## 2011-07-04 ENCOUNTER — Other Ambulatory Visit: Payer: Self-pay

## 2011-07-04 ENCOUNTER — Observation Stay (HOSPITAL_COMMUNITY): Payer: Managed Care, Other (non HMO)

## 2011-07-04 DIAGNOSIS — X58XXXA Exposure to other specified factors, initial encounter: Secondary | ICD-10-CM

## 2011-07-04 DIAGNOSIS — M25549 Pain in joints of unspecified hand: Secondary | ICD-10-CM

## 2011-07-04 DIAGNOSIS — S61409A Unspecified open wound of unspecified hand, initial encounter: Secondary | ICD-10-CM

## 2011-07-04 DIAGNOSIS — R0789 Other chest pain: Secondary | ICD-10-CM

## 2011-07-04 LAB — CARDIAC PANEL(CRET KIN+CKTOT+MB+TROPI)
Relative Index: 1.3 (ref 0.0–2.5)
Relative Index: 1.3 (ref 0.0–2.5)
Total CK: 151 U/L (ref 7–232)
Troponin I: <0.3 ng/mL (ref <0.30)

## 2011-07-04 MED ORDER — METOPROLOL TARTRATE 25 MG PO TABS
50.0000 mg | ORAL_TABLET | Freq: Once | ORAL | Status: AC
  Administered 2011-07-04: 50 mg via ORAL

## 2011-07-04 MED ORDER — METOPROLOL TARTRATE 1 MG/ML IV SOLN
INTRAVENOUS | Status: AC
  Filled 2011-07-04: qty 5

## 2011-07-04 MED ORDER — NITROGLYCERIN 0.4 MG SL SUBL
SUBLINGUAL_TABLET | SUBLINGUAL | Status: AC
  Filled 2011-07-04: qty 25

## 2011-07-04 MED ORDER — METOPROLOL TARTRATE 25 MG PO TABS
ORAL_TABLET | ORAL | Status: AC
  Filled 2011-07-04: qty 2

## 2011-07-04 MED ORDER — LIDOCAINE HCL 2 % IJ SOLN
5.0000 mL | Freq: Once | INTRAMUSCULAR | Status: AC
  Administered 2011-07-04: 100 mg
  Filled 2011-07-04: qty 1

## 2011-07-04 MED ORDER — IOHEXOL 350 MG/ML SOLN
80.0000 mL | Freq: Once | INTRAVENOUS | Status: AC | PRN
  Administered 2011-07-04: 80 mL via INTRAVENOUS

## 2011-07-04 MED ORDER — NITROGLYCERIN 0.4 MG SL SUBL
0.4000 mg | SUBLINGUAL_TABLET | Freq: Once | SUBLINGUAL | Status: AC
  Administered 2011-07-04: 09:00:00 via SUBLINGUAL

## 2011-07-04 NOTE — ED Notes (Signed)
Phone report given to [facility]. °

## 2011-07-04 NOTE — ED Provider Notes (Signed)
That level was involved in the care only 2 and into the above-noted procedure-laceration repair. The remainder of the patient care was done by me.   Dione Booze, MD 07/04/11 [phone]

## 2011-07-04 NOTE — ED Provider Notes (Signed)
Pt previously seen by Dr. Ethelda Chick and placed on Chest Pain protocol. Dr. Preston Fleeting examined patient while he was in the CDU and discovered a small laceration to the right hand needing repair. The laceration was caused by glass. Last tetanus shot was approx 6 years ago.  LACERATION REPAIR Performed by: Dorthula Matas Authorized by: Dorthula Matas Consent: Verbal consent obtained. Risks and benefits: risks, benefits and alternatives were discussed Consent given by: patient Patient identity confirmed: provided demographic data Prepped and Draped in normal sterile fashion Wound explored  Laceration Location: Web space on right hand in between the ring and middle finger.  Laceration Length: 1.25cmcm  No Foreign Bodies seen or palpated  Anesthesia: local infiltration  Local anesthetic: lidocaine 2% without epinephrine  Anesthetic total: 1 ml  Irrigation method: syringe Amount of cleaning: standard  Skin closure: Prolene 5  Number of sutures: 3  Technique: simple interrupted  Patient tolerance: Patient tolerated the procedure well with no immediate complications.    Dorthula Matas, PA 07/04/11 [phone]

## 2011-07-04 NOTE — ED Notes (Signed)
Pt in ct with rn

## 2011-07-04 NOTE — ED Notes (Signed)
EDP made aware pt is now transferred to CDU1 and on monitor cp free.

## 2011-07-04 NOTE — ED Notes (Signed)
Transferred by [facility] to [facility] ED- spoke with Reita Cliche, charge nurse- aware pt is coming- will call back to give full report per his request

## 2011-07-04 NOTE — Progress Notes (Signed)
Observation review is complete. 

## 2011-07-04 NOTE — ED Notes (Signed)
DR DOVER RADIOLOGIST HAS CALLED PT CT CARDIAC RESULTS . THEY ARE NEGATIVE CALCIUM AND NEGATIVE FOR BLOCKAGES.

## 2011-07-04 NOTE — ED Notes (Signed)
SPOKE WITH DR CAPPRIOSSI ABOUT DISCHARGE FOR PT. HE IS AWARE AND WILL COME SPEAK WITH PT AS SOON AS HE CAN

## 2011-07-15 ENCOUNTER — Ambulatory Visit (INDEPENDENT_AMBULATORY_CARE_PROVIDER_SITE_OTHER): Payer: Managed Care, Other (non HMO) | Admitting: Family Medicine

## 2011-07-15 ENCOUNTER — Encounter: Payer: Self-pay | Admitting: Family Medicine

## 2011-07-15 VITALS — BP 125/75 | HR 68 | Temp 97.9°F | Ht 71.0 in | Wt 198.2 lb

## 2011-07-15 DIAGNOSIS — F988 Other specified behavioral and emotional disorders with onset usually occurring in childhood and adolescence: Secondary | ICD-10-CM

## 2011-07-15 DIAGNOSIS — X58XXXA Exposure to other specified factors, initial encounter: Secondary | ICD-10-CM

## 2011-07-15 DIAGNOSIS — Z23 Encounter for immunization: Secondary | ICD-10-CM

## 2011-07-15 DIAGNOSIS — T148XXA Other injury of unspecified body region, initial encounter: Secondary | ICD-10-CM

## 2011-07-15 DIAGNOSIS — IMO0002 Reserved for concepts with insufficient information to code with codable children: Secondary | ICD-10-CM

## 2011-07-15 MED ORDER — ATOMOXETINE HCL 80 MG PO CAPS: ORAL_CAPSULE | ORAL | Status: DC

## 2011-07-15 NOTE — Progress Notes (Signed)
  Subjective:    Patient ID: Dennis Moore, male    DOB: September 02, 1955, 56 y.o.   MRN: [id number]  HPIPt here to be evaluated for ADD.  Self evaluation reviewed---+ ADD Pt also cut his hand on a glass over Christmas and had stiches at ER.      Review of Systems As above    Objective:   Physical Exam  Constitutional: He is oriented to person, place, and time. He appears well-developed and well-nourished.  Cardiovascular: Normal rate and regular rhythm.   Pulmonary/Chest: Effort normal and breath sounds normal.  Neurological: He is alert and oriented to person, place, and time.  Skin:       Laceration R hand---3 stitches removed  Psychiatric: He has a normal mood and affect. His behavior is normal. Judgment and thought content normal.          Assessment & Plan:  Laceration hand---healing well   Adult ADD-- straterra 80 mg daily

## 2011-07-15 NOTE — Patient Instructions (Signed)
Attention Deficit Hyperactivity Disorder Attention deficit hyperactivity disorder (ADHD) is a problem with behavior issues based on the way the brain functions (neurobehavioral disorder). It is a common reason for behavior and academic problems in school. CAUSES  The cause of ADHD is unknown in most cases. It may run in families. It sometimes can be associated with learning disabilities and other behavioral problems. SYMPTOMS  There are 3 types of ADHD. The 3 types and some of the symptoms include:  Inattentive   Gets bored or distracted easily.   Loses or forgets things. Forgets to hand in homework.   Has trouble organizing or completing tasks.   Difficulty staying on task.   An inability to organize daily tasks and school work.   Leaving projects, chores, or homework unfinished.   Trouble paying attention or responding to details. Careless mistakes.   Difficulty following directions. Often seems like is not listening.   Dislikes activities that require sustained attention (like chores or homework).   Hyperactive-impulsive   Feels like it is impossible to sit still or stay in a seat. Fidgeting with hands and feet.   Trouble waiting turn.   Talking too much or out of turn. Interruptive.   Speaks or acts impulsively.   Aggressive, disruptive behavior.   Constantly busy or on the go, noisy.   Combined   Has symptoms of both of the above.  Often children with ADHD feel discouraged about themselves and with school. They often perform well below their abilities in school. These symptoms can cause problems in home, school, and in relationships with peers. As children get older, the excess motor activities can calm down, but the problems with paying attention and staying organized persist. Most children do not outgrow ADHD but with good treatment can learn to cope with the symptoms. DIAGNOSIS  When ADHD is suspected, the diagnosis should be made by professionals trained in  ADHD.  Diagnosis will include:  Ruling out other reasons for the child's behavior.   The caregivers will check with the child's school and check their medical records.   They will talk to teachers and parents.   Behavior rating scales for the child will be filled out by those dealing with the child on a daily basis.  A diagnosis is made only after all information has been considered. TREATMENT  Treatment usually includes behavioral treatment often along with medicines. It may include stimulant medicines. The stimulant medicines decrease impulsivity and hyperactivity and increase attention. Other medicines used include antidepressants and certain blood pressure medicines. Most experts agree that treatment for ADHD should address all aspects of the child's functioning. Treatment should not be limited to the use of medicines alone. Treatment should include structured classroom management. The parents must receive education to address rewarding good behavior, discipline, and limit-setting. Tutoring or behavioral therapy or both should be available for the child. If untreated, the disorder can have long-term serious effects into adolescence and adulthood. HOME CARE INSTRUCTIONS   Often with ADHD there is a lot of frustration among the family in dealing with the illness. There is often blame and anger that is not warranted. This is a life long illness. There is no way to prevent ADHD. In many cases, because the problem affects the family as a whole, the entire family may need help. A therapist can help the family find better ways to handle the disruptive behaviors and promote change. If the child is young, most of the therapist's work is with the parents. Parents will   learn techniques for coping with and improving their child's behavior. Sometimes only the child with the ADHD needs counseling. Your caregivers can help you make these decisions.   Children with ADHD may need help in organizing. Some  helpful tips include:   Keep routines the same every day from wake-up time to bedtime. Schedule everything. This includes homework and playtime. This should include outdoor and indoor recreation. Keep the schedule on the refrigerator or a bulletin board where it is frequently seen. Mark schedule changes as far in advance as possible.   Have a place for everything and keep everything in its place. This includes clothing, backpacks, and school supplies.   Encourage writing down assignments and bringing home needed books.   Offer your child a well-balanced diet. Breakfast is especially important for school performance. Children should avoid drinks with caffeine including:   Soft drinks.   Coffee.   Tea.   However, some older children (adolescents) may find these drinks helpful in improving their attention.   Children with ADHD need consistent rules that they can understand and follow. If rules are followed, give small rewards. Children with ADHD often receive, and expect, criticism. Look for good behavior and praise it. Set realistic goals. Give clear instructions. Look for activities that can foster success and self-esteem. Make time for pleasant activities with your child. Give lots of affection.   Parents are their children's greatest advocates. Learn as much as possible about ADHD. This helps you become a stronger and better advocate for your child. It also helps you educate your child's teachers and instructors if they feel inadequate in these areas. Parent support groups are often helpful. A national group with local chapters is called CHADD (Children and Adults with Attention Deficit Hyperactivity Disorder).  PROGNOSIS  There is no cure for ADHD. Children with the disorder seldom outgrow it. Many find adaptive ways to accommodate the ADHD as they mature. SEEK MEDICAL CARE IF:  Your child has repeated muscle twitches, cough or speech outbursts.   Your child has sleep problems.   Your  child has a marked loss of appetite.   Your child develops depression.   Your child has new or worsening behavioral problems.   Your child develops dizziness.   Your child has a racing heart.   Your child has stomach pains.   Your child develops headaches.  Document Released: 06/17/2002 Document Revised: 03/09/2011 Document Reviewed: 01/28/2008 Exit[employer] Patient Information 2012 [employer]. 

## 2011-08-02 ENCOUNTER — Ambulatory Visit (INDEPENDENT_AMBULATORY_CARE_PROVIDER_SITE_OTHER): Payer: Managed Care, Other (non HMO) | Admitting: Internal Medicine

## 2011-08-02 ENCOUNTER — Telehealth: Payer: Self-pay

## 2011-08-02 ENCOUNTER — Encounter: Payer: Self-pay | Admitting: Internal Medicine

## 2011-08-02 VITALS — BP 138/78 | HR 57 | Temp 98.3°F | Resp 14 | Wt 202.5 lb

## 2011-08-02 DIAGNOSIS — L02519 Cutaneous abscess of unspecified hand: Secondary | ICD-10-CM

## 2011-08-02 DIAGNOSIS — E678 Other specified hyperalimentation: Secondary | ICD-10-CM

## 2011-08-02 DIAGNOSIS — L03119 Cellulitis of unspecified part of limb: Secondary | ICD-10-CM

## 2011-08-02 DIAGNOSIS — E785 Hyperlipidemia, unspecified: Secondary | ICD-10-CM

## 2011-08-02 MED ORDER — DOXYCYCLINE HYCLATE 100 MG PO TABS: 100.0000 mg | ORAL_TABLET | Freq: Two times a day (BID) | ORAL | Status: AC

## 2011-08-02 NOTE — Telephone Encounter (Signed)
Orders and apt scheduled    KP

## 2011-08-02 NOTE — Progress Notes (Signed)
  Subjective:    Patient ID: Dennis Moore, male    DOB: [birth year], 56 y.o.   MRN: [id number]  HPI Acute visit Had a cut at the right hand on Christmas Eve, went to the ER, 4 stitches were placed. Stitches were removed   10 days later. 2 days ago   noted some swelling and a whitehead in the area and he is concerned.  Past Medical History  Diagnosis Date  . Depression   . Hyperlipidemia   . Hypertension   . Erectile dysfunction   . Tubular adenoma of colon 11/2008  . Internal hemorrhoids   . BPH (benign prostatic hypertrophy)   . Allergic rhinitis      Review of Systems No discharge. No fever chills    Objective:   Physical Exam  Constitutional: He appears well-developed and well-nourished.  Musculoskeletal:       Between the third and fourth right digits, he has a 2 mm abscess. Mild redness around it. No discharge noted        Assessment & Plan:  Small abscess in the right hand. The area was cleaned with Betadine and alcohol, I lanceted w/ a sterile lancet. I was unable to see any discharge, unable to culture anything. He did bleed few drops The area was cleaned with more alcohol and covered. We'll prescribe antibiotics, seen instructions

## 2011-08-02 NOTE — Telephone Encounter (Signed)
Hep, lipid, tsh  244.9   278.8

## 2011-08-02 NOTE — Patient Instructions (Signed)
Keep the area clean and dry Put an antibiotic ointment x 1 week Antibiotics x 5 days Call id the area gets worse or no better in few days

## 2011-08-02 NOTE — Telephone Encounter (Signed)
Patient in the office today and due for labs but concerned about recent weight gain. He would like to have labs done. Please advise    KP

## 2011-08-03 ENCOUNTER — Other Ambulatory Visit (INDEPENDENT_AMBULATORY_CARE_PROVIDER_SITE_OTHER): Payer: Managed Care, Other (non HMO)

## 2011-08-03 DIAGNOSIS — E785 Hyperlipidemia, unspecified: Secondary | ICD-10-CM

## 2011-08-03 DIAGNOSIS — E678 Other specified hyperalimentation: Secondary | ICD-10-CM

## 2011-08-03 LAB — HEPATIC FUNCTION PANEL
ALT: 28 U/L (ref 0–53)
Alkaline Phosphatase: 40 U/L (ref 39–117)
Bilirubin, Direct: 0 mg/dL (ref 0.0–0.3)
Total Protein: 7.1 g/dL (ref 6.0–8.3)

## 2011-09-14 ENCOUNTER — Ambulatory Visit: Payer: Managed Care, Other (non HMO) | Admitting: Family Medicine

## 2011-09-23 ENCOUNTER — Ambulatory Visit (INDEPENDENT_AMBULATORY_CARE_PROVIDER_SITE_OTHER): Payer: Managed Care, Other (non HMO) | Admitting: Family Medicine

## 2011-09-23 ENCOUNTER — Ambulatory Visit: Payer: Managed Care, Other (non HMO) | Admitting: Family Medicine

## 2011-09-23 ENCOUNTER — Encounter: Payer: Self-pay | Admitting: Family Medicine

## 2011-09-23 VITALS — BP 124/78 | HR 82 | Temp 98.1°F | Ht 70.75 in | Wt 198.2 lb

## 2011-09-23 DIAGNOSIS — R197 Diarrhea, unspecified: Secondary | ICD-10-CM | POA: Insufficient documentation

## 2011-09-23 MED ORDER — DIPHENOXYLATE-ATROPINE 2.5-0.025 MG PO TABS: 1.0000 | ORAL_TABLET | Freq: Four times a day (QID) | ORAL | Status: AC | PRN

## 2011-09-23 NOTE — Progress Notes (Signed)
  Subjective:    Patient ID: Dennis Moore, male    DOB: [birth year], 56 y.o.   MRN: [id number]  HPI Diarrhea- sxs started 5 days ago.  Stools are loose.  No blood in the stool.  No fevers.  No N/V.  No one at home w/ similar sxs.  Stomach 'is boiling all the time'.  Denies abdominal pain.  Immodium will slow sxs but not resolve them.  Previously going 2x/hr, this AM has gone twice since waking.   Review of Systems For ROS see HPI     Objective:   Physical Exam  Vitals reviewed. Constitutional: He appears well-developed and well-nourished. No distress.  HENT:       MMM  Abdominal: Soft. He exhibits no distension. There is no tenderness. There is no rebound and no guarding.       Hyperactive BS          Assessment & Plan:

## 2011-09-23 NOTE — Patient Instructions (Signed)
This is a virus and will improve w/ time Drink plenty of fluids Use the Lomotil as needed Hang in there!!

## 2011-09-23 NOTE — Assessment & Plan Note (Signed)
New.  Most likely viral.  Improving.  Start lomotil prn.  Reviewed supportive care and red flags that should prompt return.  Pt expressed understanding and is in agreement w/ plan.

## 2011-10-13 ENCOUNTER — Ambulatory Visit (INDEPENDENT_AMBULATORY_CARE_PROVIDER_SITE_OTHER): Payer: Managed Care, Other (non HMO) | Admitting: Family Medicine

## 2011-10-13 ENCOUNTER — Telehealth: Payer: Self-pay | Admitting: Gastroenterology

## 2011-10-13 ENCOUNTER — Encounter: Payer: Self-pay | Admitting: Family Medicine

## 2011-10-13 VITALS — BP 128/80 | HR 86 | Temp 98.2°F | Ht 70.75 in | Wt 197.4 lb

## 2011-10-13 DIAGNOSIS — R197 Diarrhea, unspecified: Secondary | ICD-10-CM

## 2011-10-13 LAB — CBC WITH DIFFERENTIAL/PLATELET
Eosinophils Relative: 1.9 % (ref 0.0–5.0)
HCT: 40.6 % (ref 39.0–52.0)
Hemoglobin: 13.6 g/dL (ref 13.0–17.0)
Lymphs Abs: 2.6 10*3/uL (ref 0.7–4.0)
MCV: 91.5 fl (ref 78.0–100.0)
Monocytes Absolute: 0.5 10*3/uL (ref 0.1–1.0)
Neutro Abs: 3.4 10*3/uL (ref 1.4–7.7)
Platelets: 294 10*3/uL (ref 150.0–400.0)
RDW: 16 % — ABNORMAL HIGH (ref 11.5–14.6)
WBC: 6.7 10*3/uL (ref 4.5–10.5)

## 2011-10-13 LAB — BASIC METABOLIC PANEL
Calcium: 9.2 mg/dL (ref 8.4–10.5)
Creatinine, Ser: 1.3 mg/dL (ref 0.4–1.5)
GFR: 74.65 mL/min (ref >60.00)
Glucose, Bld: 94 mg/dL (ref 70–99)
Sodium: 139 mEq/L (ref 135–145)

## 2011-10-13 LAB — HEPATIC FUNCTION PANEL
Albumin: 4.5 g/dL (ref 3.5–5.2)
Alkaline Phosphatase: 49 U/L (ref 39–117)

## 2011-10-13 NOTE — Assessment & Plan Note (Signed)
Pt's sxs more consistent w/ IBS and not true diarrhea but pt denies constipation and is upset by current situation.  Check labs to r/o electrolyte abnormality and infxn.  Encouraged increased fluids, fiber.  Will refer to GI due to pt's frustration w/ situation.  Reviewed supportive care and red flags that should prompt return.  Pt expressed understanding and is in agreement w/ plan.

## 2011-10-13 NOTE — Patient Instructions (Signed)
We'll notify you of your lab results Start Gas-X to decrease the amount of gas/bloating Drink plenty of fluids Increase the amount of fiber in your diet in the form of whole grains, leafy greens, or fiber supplements Someone will call you with your GI appt Immodium as needed Call with any questions or concerns Hang in there!

## 2011-10-13 NOTE — Progress Notes (Signed)
  Subjective:    Patient ID: Dennis Moore, male    DOB: Jan 13, 1956, 56 y.o.   MRN: [id number]  HPI 'diarrhea'- reports he is having sxs x1 month.  3 stools/day.  Some loose, some watery.  Increased gas.  Denies abdominal pain.  Occuring after eating and in the AM.  No fevers.  No one else w/ similar sxs.  No relief w/ lomotil.  Has not changed diet, no new meds.   Review of Systems For ROS see HPI     Objective:   Physical Exam  Vitals reviewed. Constitutional: He appears well-developed and well-nourished. No distress.  HENT:       MMM  Cardiovascular: Normal rate, regular rhythm and normal heart sounds.   No murmur heard. Pulmonary/Chest: Effort normal and breath sounds normal. No respiratory distress. He has no wheezes. He has no rales.  Abdominal: Soft. Bowel sounds are normal. He exhibits no distension. There is no tenderness. There is no rebound and no guarding.          Assessment & Plan:

## 2011-10-14 NOTE — Telephone Encounter (Signed)
Patient is scheduled to see Willette Cluster RNP on 10/18/11 2:30

## 2011-10-14 NOTE — Telephone Encounter (Signed)
I have left a message for the Renee to check with the patient and see if he wants to see the extender

## 2011-10-18 ENCOUNTER — Ambulatory Visit: Payer: Managed Care, Other (non HMO) | Admitting: Nurse Practitioner

## 2011-11-28 ENCOUNTER — Encounter: Payer: Self-pay | Admitting: Family Medicine

## 2011-11-28 ENCOUNTER — Encounter: Payer: Self-pay | Admitting: Gastroenterology

## 2011-11-28 ENCOUNTER — Ambulatory Visit (INDEPENDENT_AMBULATORY_CARE_PROVIDER_SITE_OTHER): Payer: Managed Care, Other (non HMO) | Admitting: Family Medicine

## 2011-11-28 VITALS — BP 124/80 | HR 71 | Temp 97.9°F | Wt 204.0 lb

## 2011-11-28 DIAGNOSIS — E785 Hyperlipidemia, unspecified: Secondary | ICD-10-CM

## 2011-11-28 DIAGNOSIS — R350 Frequency of micturition: Secondary | ICD-10-CM

## 2011-11-28 DIAGNOSIS — R195 Other fecal abnormalities: Secondary | ICD-10-CM

## 2011-11-28 LAB — POCT URINALYSIS DIPSTICK
Bilirubin, UA: NEGATIVE
Glucose, UA: NEGATIVE
Leukocytes, UA: NEGATIVE
Nitrite, UA: NEGATIVE
Urobilinogen, UA: 0.2

## 2011-11-28 MED ORDER — FENOFIBRATE MICRONIZED 130 MG PO CAPS: 130.0000 mg | ORAL_CAPSULE | Freq: Every day | ORAL | Status: DC

## 2011-11-28 NOTE — Patient Instructions (Signed)
Rectal Bleeding  Rectal bleeding is when blood passes out of the anus. It is usually a sign that something is wrong. It may not be serious, but it should always be evaluated. Rectal bleeding may present as bright red blood or extremely dark stools. The color may range from dark red or maroon to black (like tar). It is important that the cause of rectal bleeding be identified so treatment can be started and the problem corrected.  CAUSES    Hemorrhoids. These are enlarged (dilated) blood vessels or veins in the anal or rectal area.   Fistulas. Theseare abnormal, burrowing channels that usually run from inside the rectum to the skin around the anus. They can bleed.   Anal fissures. This is a tear in the tissue of the anus. Bleeding occurs with bowel movements.   Diverticulosis. This is a condition in which pockets or sacs project from the bowel wall. Occasionally, the sacs can bleed.   Diverticulitis. Thisis an infection involving diverticulosis of the colon.   Proctitis and colitis. These are conditions in which the rectum, colon, or both, can become inflamed and pitted (ulcerated).   Polyps and cancer. Polyps are non-cancerous (benign) growths in the colon that may bleed. Certain types of polyps turn into cancer.   Protrusion of the rectum. Part of the rectum can project from the anus and bleed.   Certain medicines.   Intestinal infections.   Blood vessel abnormalities.  HOME CARE INSTRUCTIONS   Eat a high-fiber diet to keep your stool soft.   Limit activity.   Drink enough fluids to keep your urine clear or pale yellow.   Warm baths may be useful to soothe rectal pain.   Follow up with your caregiver as directed.  SEEK IMMEDIATE MEDICAL CARE IF:   You develop increased bleeding.   You have black or dark red stools.   You vomit blood or material that looks like coffee grounds.   You have abdominal pain or tenderness.   You have a fever.   You feel weak, nauseous, or you faint.   You have  severe rectal pain or you are unable to have a bowel movement.  MAKE SURE YOU:   Understand these instructions.   Will watch your condition.   Will get help right away if you are not doing well or get worse.  Document Released: 12/17/2001 Document Revised: 06/16/2011 Document Reviewed: 12/12/2010  Exit[employer] Patient Information 2012 [employer].

## 2011-11-28 NOTE — Progress Notes (Signed)
  Subjective:    Patient ID: Dennis Moore, male    DOB: March 20, 1956, 56 y.o.   MRN: [id number]  HPI Pt here c/o urinary frequency.  No dysuria.  No abd pain,  No diarrhea.   Review of Systems    as above Objective:   Physical Exam  Constitutional: He appears well-developed and well-nourished.  Abdominal: Soft. Bowel sounds are normal.  Genitourinary: Guaiac positive stool.       Slightly enlarged prostate, smooth  Psychiatric: He has a normal mood and affect. His behavior is normal. Judgment and thought content normal.          Assessment & Plan:  Heme positive stool----refer to GI  Urinary frequency---- consider urology                                    Culture pending

## 2011-11-29 LAB — CBC WITH DIFFERENTIAL/PLATELET
Basophils Relative: 1.1 % (ref 0.0–3.0)
Eosinophils Relative: 1.4 % (ref 0.0–5.0)
HCT: 35.5 % — ABNORMAL LOW (ref 39.0–52.0)
Hemoglobin: 11.8 g/dL — ABNORMAL LOW (ref 13.0–17.0)
Lymphocytes Relative: 38.8 % (ref 12.0–46.0)
Monocytes Relative: 4.9 % (ref 3.0–12.0)
Neutro Abs: 2.9 10*3/uL (ref 1.4–7.7)
RBC: 3.85 Mil/uL — ABNORMAL LOW (ref 4.22–5.81)

## 2011-11-30 LAB — URINE CULTURE
Colony Count: NO GROWTH
Organism ID, Bacteria: NO GROWTH

## 2011-12-01 NOTE — Progress Notes (Signed)
Referral already in.

## 2011-12-08 ENCOUNTER — Ambulatory Visit (INDEPENDENT_AMBULATORY_CARE_PROVIDER_SITE_OTHER): Payer: Managed Care, Other (non HMO) | Admitting: Family Medicine

## 2011-12-08 ENCOUNTER — Encounter: Payer: Self-pay | Admitting: Family Medicine

## 2011-12-08 ENCOUNTER — Telehealth: Payer: Self-pay | Admitting: Gastroenterology

## 2011-12-08 ENCOUNTER — Ambulatory Visit: Payer: Managed Care, Other (non HMO) | Admitting: Nurse Practitioner

## 2011-12-08 VITALS — BP 118/76 | HR 76 | Temp 98.8°F | Wt 204.0 lb

## 2011-12-08 DIAGNOSIS — R350 Frequency of micturition: Secondary | ICD-10-CM

## 2011-12-08 DIAGNOSIS — K625 Hemorrhage of anus and rectum: Secondary | ICD-10-CM

## 2011-12-08 LAB — POCT URINALYSIS DIPSTICK
Bilirubin, UA: NEGATIVE
Glucose, UA: NEGATIVE
Ketones, UA: NEGATIVE
Spec Grav, UA: 1.015
Urobilinogen, UA: 0.2

## 2011-12-08 LAB — CBC WITH DIFFERENTIAL/PLATELET
Basophils Relative: 0.6 % (ref 0.0–3.0)
Eosinophils Relative: 2.6 % (ref 0.0–5.0)
Hemoglobin: 12.4 g/dL — ABNORMAL LOW (ref 13.0–17.0)
MCV: 92.8 fl (ref 78.0–100.0)
Monocytes Absolute: 0.6 10*3/uL (ref 0.1–1.0)
Neutrophils Relative %: 43.9 % (ref 43.0–77.0)
RBC: 4.02 Mil/uL — ABNORMAL LOW (ref 4.22–5.81)
WBC: 6.6 10*3/uL (ref 4.5–10.5)

## 2011-12-08 MED ORDER — HYDROCORTISONE ACETATE 25 MG RE SUPP: 25.0000 mg | Freq: Two times a day (BID) | RECTAL | Status: AC

## 2011-12-08 NOTE — Assessment & Plan Note (Signed)
Check labs Hx int hemorrhoids-- anusol supp F/u GI

## 2011-12-08 NOTE — Patient Instructions (Signed)
Rectal Bleeding  Rectal bleeding is when blood passes out of the anus. It is usually a sign that something is wrong. It may not be serious, but it should always be evaluated. Rectal bleeding may present as bright red blood or extremely dark stools. The color may range from dark red or maroon to black (like tar). It is important that the cause of rectal bleeding be identified so treatment can be started and the problem corrected.  CAUSES    Hemorrhoids. These are enlarged (dilated) blood vessels or veins in the anal or rectal area.   Fistulas. Theseare abnormal, burrowing channels that usually run from inside the rectum to the skin around the anus. They can bleed.   Anal fissures. This is a tear in the tissue of the anus. Bleeding occurs with bowel movements.   Diverticulosis. This is a condition in which pockets or sacs project from the bowel wall. Occasionally, the sacs can bleed.   Diverticulitis. Thisis an infection involving diverticulosis of the colon.   Proctitis and colitis. These are conditions in which the rectum, colon, or both, can become inflamed and pitted (ulcerated).   Polyps and cancer. Polyps are non-cancerous (benign) growths in the colon that may bleed. Certain types of polyps turn into cancer.   Protrusion of the rectum. Part of the rectum can project from the anus and bleed.   Certain medicines.   Intestinal infections.   Blood vessel abnormalities.  HOME CARE INSTRUCTIONS   Eat a high-fiber diet to keep your stool soft.   Limit activity.   Drink enough fluids to keep your urine clear or pale yellow.   Warm baths may be useful to soothe rectal pain.   Follow up with your caregiver as directed.  SEEK IMMEDIATE MEDICAL CARE IF:   You develop increased bleeding.   You have black or dark red stools.   You vomit blood or material that looks like coffee grounds.   You have abdominal pain or tenderness.   You have a fever.   You feel weak, nauseous, or you faint.   You have  severe rectal pain or you are unable to have a bowel movement.  MAKE SURE YOU:   Understand these instructions.   Will watch your condition.   Will get help right away if you are not doing well or get worse.  Document Released: 12/17/2001 Document Revised: 06/16/2011 Document Reviewed: 12/12/2010  Exit[employer] Patient Information 2012 [employer].

## 2011-12-08 NOTE — Telephone Encounter (Signed)
Patient reports blood in the tissue when he wipes after a MB.  He left Dr. Ernst Spell office and headed to [facility].  He is rescheduled for Monday at 9:30

## 2011-12-08 NOTE — Telephone Encounter (Signed)
Patient is passing clots of blood.  He was seen by Dr. Laury Axon this am.  Per Willette Cluster RNP ok to see today at 3:00

## 2011-12-08 NOTE — Assessment & Plan Note (Signed)
Ua, culture normal Prostate normal Refer to urology

## 2011-12-08 NOTE — Progress Notes (Signed)
  Subjective:    Patient ID: Dennis Moore, male    DOB: July 29, 1955, 55 y.o.   MRN: [id number]  HPIpt states urinary symptoms are no better and bleeding from rectum is worse.  No abd pain etc.       Review of Systems As above    Objective:   Physical Exam  Constitutional: He appears well-developed and well-nourished.  Cardiovascular: Normal rate and regular rhythm.   Pulmonary/Chest: Effort normal and breath sounds normal.  Abdominal: Soft. Bowel sounds are normal.  Psychiatric: He has a normal mood and affect. His behavior is normal. Judgment and thought content normal.          Assessment & Plan:

## 2011-12-12 ENCOUNTER — Ambulatory Visit: Payer: Managed Care, Other (non HMO) | Admitting: Physician Assistant

## 2011-12-13 ENCOUNTER — Ambulatory Visit (INDEPENDENT_AMBULATORY_CARE_PROVIDER_SITE_OTHER): Payer: Managed Care, Other (non HMO) | Admitting: Physician Assistant

## 2011-12-13 ENCOUNTER — Encounter: Payer: Self-pay | Admitting: Family Medicine

## 2011-12-13 ENCOUNTER — Encounter: Payer: Self-pay | Admitting: Physician Assistant

## 2011-12-13 VITALS — BP 130/80 | HR 80 | Ht 72.0 in | Wt 197.2 lb

## 2011-12-13 DIAGNOSIS — R197 Diarrhea, unspecified: Secondary | ICD-10-CM

## 2011-12-13 DIAGNOSIS — R5383 Other fatigue: Secondary | ICD-10-CM

## 2011-12-13 DIAGNOSIS — R5381 Other malaise: Secondary | ICD-10-CM

## 2011-12-13 DIAGNOSIS — K625 Hemorrhage of anus and rectum: Secondary | ICD-10-CM

## 2011-12-13 MED ORDER — DIPHENOXYLATE-ATROPINE 2.5-0.025 MG PO TABS: ORAL_TABLET | ORAL | Status: DC

## 2011-12-13 MED ORDER — MOVIPREP 100 G PO SOLR: ORAL | Status: DC

## 2011-12-13 NOTE — Progress Notes (Signed)
Subjective:    Patient ID: Dennis Moore, male    DOB: 03-24-1956, 56 y.o.   MRN: [id number]  HPI Dennis Moore is a 56 year old male known to Dr. Russella Dar from prior colonoscopy done in May of 2010. At that time he had 2 polyps in the transverse colon and a 4 mm polyp in the left colon all consistent with tubular adenomas. Was also noted to have internal hemorrhoids. At this time he is referred back for complaints of rectal bleeding. Patient relates that he has been having intermittent rectal bleeding since March and says that he notices blood most days. Primarily he is seeing blood on the tissue after bowel movements but has on a couple of occasions seen small clots in the commode. He has also had an increase in frequency of bowel movements with 3-4 bowel movements per day of loose stools over the past couple of months. He says he has good days and bad days but on a very bad day he may have 10 bowel movements in 1 day. He says yesterday was a bad day and he was also up all night last night with liquid to watery stool. He does complain of some associated abdominal cramping. Says she did not notice any bleeding yesterday her last night. He has not had any documented fever. His weight has been overall stable and his appetite is good. General he has not had any nausea but last night he did have some vague nausea. He complains of fatigue which is been ongoing over the past few months as well. He has also recently been told that he had microscopic hematuria and is waiting for a urology appointment.  Patient was evaluated by Dr. Laury Axon, last week, and labs on 12/08/2011 showed hemoglobin of 12.4 hematocrit of 37.3. He was also documented Hemoccult-positive.  Patient has not been started on any new medications or supplements and has not had any recent antibiotic    Review of Systems  Constitutional: Positive for fatigue.  HENT: Negative.   Eyes: Negative.   Respiratory: Negative.   Cardiovascular: Negative.     Gastrointestinal: Positive for abdominal pain and diarrhea.  Musculoskeletal: Negative.   Neurological: Negative.   Hematological: Negative.   Psychiatric/Behavioral: Negative.    Outpatient Prescriptions Prior to Visit  Medication Sig Dispense Refill  . atomoxetine (STRATTERA) 80 MG capsule Starter pack given 25mg -  80 mg   rto 1 month  30 capsule  0  . fenofibrate micronized (ANTARA) 130 MG capsule Take 1 capsule (130 mg total) by mouth daily before breakfast.  30 capsule  2  . hydrocortisone (ANUSOL-HC) 25 MG suppository Place 1 suppository (25 mg total) rectally 2 (two) times daily.  12 suppository  0  . lisinopril-hydrochlorothiazide (PRINZIDE,ZESTORETIC) 20-12.5 MG per tablet Take 1 tablet by mouth daily.  90 tablet  3  . rosuvastatin (CRESTOR) 20 MG tablet Take 20 mg by mouth daily.        . tadalafil (CIALIS) 20 MG tablet Take 20 mg by mouth daily as needed.      . diphenoxylate-atropine (LOMOTIL) 2.5-0.025 MG per tablet             Allergies  Allergen Reactions  . Penicillins Swelling and Rash   Patient Active Problem List  Diagnoses  . PURE HYPERCHOLESTEROLEMIA  . HYPERLIPIDEMIA  . HYPERLIPIDEMIA  . ERECTILE DYSFUNCTION  . DEPRESSION  . HYPERTENSION  . HEMORRHOIDS, EXTERNAL W/O COMPLICATION  . POSTURAL HYPOTENSION  . Acute Sinusitis, Unspecified  . ALLERGIC RHINITIS, SEASONAL  .  HIP PAIN, LEFT  . FATIGUE  . NIGHT SWEATS  . TREMOR  . COUGH  . CHEST PAIN UNSPECIFIED  . OTHER ABNORMAL BLOOD CHEMISTRY  . ELEVATED BLOOD PRESSURE WITHOUT DIAGNOSIS OF HYPERTENSION  . BENIGN PROSTATIC HYPERTROPHY, HX OF  . Hip pain, bilateral  . Sinusitis  . Inguinal lymphadenopathy  . Diarrhea  . Urinary frequency  . Rectal bleed   History   Social History  . Marital Status: Married    Spouse Name: N/A    Number of Children: 6  . Years of Education: N/A   Occupational History  . disabled    Social History Main Topics  . Smoking status: Never Smoker   . Smokeless  tobacco: Never Used  . Alcohol Use: No  . Drug Use: No  . Sexually Active: Not on file   Other Topics Concern  . Not on file   Social History Narrative  . No narrative on file    Objective:   Physical Exam well-developed African American male in no acute distress, pleasant blood pressure 130/80 pulse 80 height 6 foot weight 197. HEENT; nontraumatic normocephalic EOMI PERRLA sclera anicteric,neck; Supple no JVD, Cardiovascular; regular rate and rhythm with S1-S2 no murmur or gallop, Pulmonary; clear bilaterally, Abdomen ; some bilateral mid and lower quadrant tenderness no guarding no rebound no palpable mass or hepatosplenomegaly bowel sounds are active, Rectal; exam not done this was done per Dr.Llowne about 2 weeks ago and was Hemoccult-positive. Extremities; no clubbing cyanosis or edema skin warm and dry, Psych; mood and affect normal and appropriate, somewhat anxious.        Assessment & Plan:  #23 56 year old male with 3 month history of intermittent hematochezia and gradually progressive diarrhea abdominal cramping . I doubt this is infectious given 3 month course, will need to rule out IBD, or occult lesion. Also consider microscopic colitis with bleeding secondary to internal hemorrhoids. #2 microscopic hematuria-UA showing pending  #3 Hx depression #4 hyperlipidemia #5 hypertension #6 is here of adenomatous colon polyps, last colonoscopy May 2010  Plan; CBC with differential, CRP and Bmet today  Stool for lactoferrin and C. difficile PCR Lomotil one to 3 tablets daily as needed Schedule for colonoscopy with Dr. Jalene Mullet is discussed in detail with the patient and he is agreeable to proceed

## 2011-12-13 NOTE — Progress Notes (Signed)
Flex.sigm is an alternative. Anoscopy would have helped to rule in anal source. ?? Anusol HC supp.

## 2011-12-14 ENCOUNTER — Telehealth: Payer: Self-pay | Admitting: Gastroenterology

## 2011-12-14 NOTE — Telephone Encounter (Signed)
I left a message for the patient to come pick up a MoviPrep voucher.

## 2011-12-16 ENCOUNTER — Ambulatory Visit (AMBULATORY_SURGERY_CENTER): Payer: Managed Care, Other (non HMO) | Admitting: Gastroenterology

## 2011-12-16 ENCOUNTER — Encounter: Payer: Self-pay | Admitting: Gastroenterology

## 2011-12-16 VITALS — BP 124/82 | HR 57 | Temp 97.2°F | Resp 17 | Ht 72.0 in | Wt 197.0 lb

## 2011-12-16 DIAGNOSIS — R197 Diarrhea, unspecified: Secondary | ICD-10-CM

## 2011-12-16 DIAGNOSIS — K648 Other hemorrhoids: Secondary | ICD-10-CM

## 2011-12-16 DIAGNOSIS — D126 Benign neoplasm of colon, unspecified: Secondary | ICD-10-CM

## 2011-12-16 DIAGNOSIS — Z86010 Personal history of colonic polyps: Secondary | ICD-10-CM

## 2011-12-16 DIAGNOSIS — K625 Hemorrhage of anus and rectum: Secondary | ICD-10-CM

## 2011-12-16 DIAGNOSIS — R5383 Other fatigue: Secondary | ICD-10-CM

## 2011-12-16 MED ORDER — SODIUM CHLORIDE 0.9 % IV SOLN: 500.0000 mL | INTRAVENOUS | Status: DC

## 2011-12-16 NOTE — Patient Instructions (Signed)

## 2011-12-16 NOTE — Op Note (Signed)
[facility] [address] [place], [place]  [address]  COLONOSCOPY PROCEDURE REPORT PATIENT:  Dennis Moore, Dennis Moore  MR#:  [id number] BIRTHDATE:  [birth year], 55 yrs. old  GENDER:  male ENDOSCOPIST:  Judie Petit T. Russella Dar, MD, [facility]  PROCEDURE DATE:  12/16/2011 PROCEDURE:  Colonoscopy with biopsy, snare polypectomy and inj sclerosis of hemorrhoids ASA CLASS:  Class II INDICATIONS:  1) hematochezia  2) unexplained diarrhea  3) history of pre-cancerous (adenomatous) colon polyps: 11/2008 MEDICATIONS:   These medications were titrated to patient response per physician's verbal order, Fentanyl 100 mcg IV, Versed 10 mg IV DESCRIPTION OF PROCEDURE:   After the risks benefits and alternatives of the procedure were thoroughly explained, informed consent was obtained.  Digital rectal exam was performed and revealed no abnormalities.   The LB [id number]-[id number] endoscope was introduced through the anus and advanced to the terminal ileum which was intubated for a short distance, without limitations. The quality of the prep was good, using MoviPrep.  The instrument was then slowly withdrawn as the colon was fully examined. <<PROCEDUREIMAGES>> FINDINGS:  A sessile polyp was found in the ascending colon. It was 5 mm in size. Polyp was snared without cautery. Retrieval was successful.  Otherwise normal colonoscopy without other polyps, masses, vascular ectasias, or inflammatory changes. Random biopsies were obtained and sent to pathology.  The terminal ileum appeared normal.  Internal hemorrhoids were found. Injection sclerosis of hemorrhoids above dentate line with 3 cc of 23.4% saline.  Retroflexed views in the rectum revealed no other findings other than those already described.  The time to cecum = 2.5  minutes. The scope was then withdrawn (time =  12.25  min) from the patient and the procedure completed.  COMPLICATIONS:  None  ENDOSCOPIC IMPRESSION: 1) 5 mm sessile polyp in the ascending  colon 2) Normal terminal ileum 3) Internal hemorrhoids  RECOMMENDATIONS: 1) Await pathology results 2) Repeat Colonoscopy in 5 years.  Venita Lick. Russella Dar, MD, [facility]  n. eSIGNED:   Venita Lick. Meagon Duskin at 12/16/2011 03:18 PM  Alesia Richards, [phone]

## 2011-12-16 NOTE — Progress Notes (Signed)
Patient did not experience any of the following events: a burn prior to discharge; a fall within the facility; wrong site/side/patient/procedure/implant event; or a hospital transfer or hospital admission upon discharge from the facility. (G8907) Patient did not have preoperative order for IV antibiotic SSI prophylaxis. (G8918)  

## 2011-12-19 ENCOUNTER — Telehealth: Payer: Self-pay | Admitting: *Deleted

## 2011-12-19 DIAGNOSIS — Z0279 Encounter for issue of other medical certificate: Secondary | ICD-10-CM

## 2011-12-19 NOTE — Telephone Encounter (Signed)
Also discussed using sitz bathes for hemorrhoidal pain and pt said he was already aware of that because he had already been instructed to do that.

## 2011-12-19 NOTE — Telephone Encounter (Signed)
  Follow up Call-  Call back number 12/16/2011  Post procedure Call Back phone  # [phone]  Permission to leave phone message Yes     Patient questions:  Do you have a fever, pain , or abdominal swelling? yes Pain Score  3 *  Have you tolerated food without any problems? yes  Have you been able to return to your normal activities? yes  Do you have any questions about your discharge instructions: Diet   no Medications  no Follow up visit  no  Do you have questions or concerns about your Care? no  Actions: * If pain score is 4 or above: No action needed, pain <4.  Pt does c/o some discomfort at rectal area that began yesterday, and slight blood in stool. Pt felt like this was from hemorrhoids. Discussed with pt that hemorrhoids were injected in procedure on Friday that he should call us back if discomfort does not improve each day or if bleeding increases to 1/2 cup.

## 2011-12-20 ENCOUNTER — Telehealth: Payer: Self-pay | Admitting: Gastroenterology

## 2011-12-20 DIAGNOSIS — K625 Hemorrhage of anus and rectum: Secondary | ICD-10-CM

## 2011-12-20 MED ORDER — HYDROCORTISONE ACETATE 25 MG RE SUPP: 25.0000 mg | Freq: Two times a day (BID) | RECTAL | Status: AC

## 2011-12-20 NOTE — Telephone Encounter (Signed)
Discussed with Mike Gip PA patient to start anusol suppositories BID and come for CBC.  He is asked to call tomorrow with an update on his bleeding and if it increases he will need to be seen in the office.  Patient is agreeable to this plan.

## 2011-12-20 NOTE — Telephone Encounter (Signed)
Patient had a colonoscopy on 12/16/11 he had a 5 mm polyp removed and internal hemorrhoids injected.  He spoke with the [facility] nurse yesterday and c/o rectal discomfort and bleeding.  Yesterday he had bright red blood with a stool, today he had a large amount of blood and mucus independent of stool.  He has continuous rectal "discomfort".  He was seen in the office on 12/13/11 with rectal bleeding.  Compared to 12/13/11 the amount of bleeding has increased.  Amy Esterwood PA please review and advise

## 2011-12-21 ENCOUNTER — Encounter: Payer: Self-pay | Admitting: Gastroenterology

## 2011-12-21 NOTE — Telephone Encounter (Signed)
Patient did not come for CBC as asked.  I called to check on him today.  He states he had to go out of town on an emergency yesterday and is still in [place] with his grandchild that fell at daycare.  He reports bleeding has stopped.  He says he will come tomorrow for CBC.

## 2011-12-26 ENCOUNTER — Ambulatory Visit: Payer: Managed Care, Other (non HMO) | Admitting: Gastroenterology

## 2012-01-16 ENCOUNTER — Encounter: Payer: Self-pay | Admitting: Internal Medicine

## 2012-01-16 ENCOUNTER — Ambulatory Visit (INDEPENDENT_AMBULATORY_CARE_PROVIDER_SITE_OTHER): Payer: Managed Care, Other (non HMO) | Admitting: Internal Medicine

## 2012-01-16 VITALS — BP 138/76 | HR 83 | Temp 98.0°F | Wt 191.0 lb

## 2012-01-16 DIAGNOSIS — K625 Hemorrhage of anus and rectum: Secondary | ICD-10-CM

## 2012-01-16 MED ORDER — HYDROCORTISONE ACE-PRAMOXINE 2.5-1 % RE CREA: TOPICAL_CREAM | Freq: Three times a day (TID) | RECTAL | Status: AC

## 2012-01-16 NOTE — Progress Notes (Signed)
  Subjective:    Patient ID: Dennis Moore, male    DOB: Sep 27, 1955, 56 y.o.   MRN: [id number]  HPI Acute visit Here because he is concerned about red blood in the toilet paper on and off and mild diarrhea. Was initially seen by PCP, then send to GI, colonoscopy was performed, biopsy was negative for colitis, he had a tubular adenoma and internal hemorrhoids. They did labs, CBC essentially normal.  Past Medical History  Diagnosis Date  . Depression   . Hyperlipidemia   . Hypertension   . Erectile dysfunction   . Tubular adenoma of colon 11/2008  . Internal hemorrhoids   . BPH (benign prostatic hypertrophy)   . Allergic rhinitis     Review of Systems No melena, no abdominal pain    Objective:   Physical Exam  Alert oriented x3, no apparent distress. Abdomen, not distended, soft, good bowel sounds Anus is inspected, no external hemorrhoids. Did not consent for a DRE      Assessment & Plan:

## 2012-01-16 NOTE — Assessment & Plan Note (Signed)
Intermittent rectal bleeding and mild increase in bowel movement frequency, no external hemorrhoids on exam, recent colonoscopy showed internal hemorrhoids and no colitis on biopsy. Plan: Metamucil Hydrocortisone Recheck w/ PCP in 4 weeks

## 2012-01-16 NOTE — Patient Instructions (Addendum)
Metamucil one or 2 capsules twice a day with lots of fluids Align OTC probiotic once a day analpram as prescribed See your primary doctor in 4-6 weeks Call anytime if symptoms severe

## 2012-01-18 ENCOUNTER — Encounter: Payer: Self-pay | Admitting: Gastroenterology

## 2012-01-27 ENCOUNTER — Telehealth: Payer: Self-pay | Admitting: Family Medicine

## 2012-01-27 MED ORDER — SILDENAFIL CITRATE 100 MG PO TABS: 50.0000 mg | ORAL_TABLET | Freq: Every day | ORAL | Status: DC | PRN

## 2012-01-27 NOTE — Telephone Encounter (Signed)
We will do one or the other---which does he prefer? If viagra 100 mg  #10  As directed   2 refills

## 2012-01-27 NOTE — Telephone Encounter (Signed)
Patient states he would like a return call from Dr. Ernst Spell assistant. When asked what it was in reference to, patient stated we were supposed to call in a prescription for him. He did not know the name of the medication or what it was for. Patient states "i just have a lot going on right now and i need her to call me back."

## 2012-01-27 NOTE — Telephone Encounter (Signed)
I called and spoke with patient, patient states at last Office visit he was to get rx for Viagra. Patient states he would like a rx for viagra and levitra to [facility] on [address].  Dr.Lowne please advise if ok to send rx for Viagra and Levitra [facility] med is on patient's med list)

## 2012-01-31 ENCOUNTER — Telehealth: Payer: Self-pay | Admitting: Family Medicine

## 2012-01-31 NOTE — Telephone Encounter (Signed)
Pt called requesting call back regarding his conversation with Chrae last week. Call back # [phone]

## 2012-01-31 NOTE — Telephone Encounter (Signed)
Call from patient and he wanted to know why we did not send the Levitra, I made the patient aware that we do not send both. We called in the Viagra and he voiced understanding.    KP

## 2012-02-10 ENCOUNTER — Encounter: Payer: Self-pay | Admitting: Family Medicine

## 2012-02-10 ENCOUNTER — Ambulatory Visit (INDEPENDENT_AMBULATORY_CARE_PROVIDER_SITE_OTHER): Payer: Managed Care, Other (non HMO) | Admitting: Family Medicine

## 2012-02-10 VITALS — BP 142/90 | HR 65 | Temp 98.2°F | Wt 191.0 lb

## 2012-02-10 DIAGNOSIS — K625 Hemorrhage of anus and rectum: Secondary | ICD-10-CM

## 2012-02-10 DIAGNOSIS — R197 Diarrhea, unspecified: Secondary | ICD-10-CM

## 2012-02-10 MED ORDER — HYOSCYAMINE SULFATE 0.125 MG SL SUBL: 0.1250 mg | SUBLINGUAL_TABLET | SUBLINGUAL | Status: DC | PRN

## 2012-02-10 MED ORDER — DIPHENOXYLATE-ATROPINE 2.5-0.025 MG PO TABS: ORAL_TABLET | ORAL | Status: DC

## 2012-02-10 NOTE — Progress Notes (Signed)
  Subjective:     Dennis Moore is a 56 y.o. male who presents for evaluation of diarrhea. Onset of diarrhea was 3 days ago. Diarrhea is occurring approximately 5 times per day. Patient describes diarrhea as watery. Diarrhea has been associated with abdominal pain described as cramping. Patient denies blood in stool, illness in household contacts, recent antibiotic use, recent camping, recent travel. Previous visits for diarrhea: yes and seen by GI but it resolved and is now back.. Evaluation to date: colonoscopy and labs pending.  Treatment to date: lomotil.  The following portions of the patient's history were reviewed and updated as appropriate: allergies, current medications, past family history, past medical history, past social history, past surgical history and problem list.  Review of Systems Pertinent items are noted in HPI.    Objective:    BP 142/90  Pulse 65  Temp 98.2 F (36.8 C) (Oral)  Wt 191 lb (86.637 kg)  SpO2 98% General: alert, cooperative, appears stated age and no distress  Hydration:  well hydrated  Abdomen:    soft, non-tender; bowel sounds normal; no masses,  no organomegaly    Assessment:    Diarrhea, mild in severity   Plan:    Appropriate educational material discussed and distributed. Follow up as needed. Lab studies per orders. Medications per orders. Stool studies per orders.  Studies ordered per GI

## 2012-02-10 NOTE — Patient Instructions (Addendum)
Diarrhea Infections caused by germs (bacterial) or a virus commonly cause diarrhea. Your caregiver has determined that with time, rest and fluids, the diarrhea should improve. In general, eat normally while drinking more water than usual. Although water may prevent dehydration, it does not contain salt and minerals (electrolytes). Broths, weak tea without caffeine and oral rehydration solutions (ORS) replace fluids and electrolytes. Small amounts of fluids should be taken frequently. Large amounts at one time may not be tolerated. Plain water may be harmful in infants and the elderly. Oral rehydrating solutions (ORS) are available at pharmacies and grocery stores. ORS replace water and important electrolytes in proper proportions. Sports drinks are not as effective as ORS and may be harmful due to sugars worsening diarrhea.  ORS is especially recommended for use in children with diarrhea. As a general guideline for children, replace any new fluid losses from diarrhea and/or vomiting with ORS as follows:   If your child weighs 22 pounds or under (10 kg or less), give 60-120 mL ( -  cup or 2 - 4 ounces) of ORS for each episode of diarrheal stool or vomiting episode.   If your child weighs more than 22 pounds (more than 10 kgs), give 120-240 mL ( - 1 cup or 4 - 8 ounces) of ORS for each diarrheal stool or episode of vomiting.   While correcting for dehydration, children should eat normally. However, foods high in sugar should be avoided because this may worsen diarrhea. Large amounts of carbonated soft drinks, juice, gelatin desserts and other highly sugared drinks should be avoided.   After correction of dehydration, other liquids that are appealing to the child may be added. Children should drink small amounts of fluids frequently and fluids should be increased as tolerated. Children should drink enough fluids to keep urine clear or pale yellow.   Adults should eat normally while drinking more fluids  than usual. Drink small amounts of fluids frequently and increase as tolerated. Drink enough fluids to keep urine clear or pale yellow. Broths, weak decaffeinated tea, lemon lime soft drinks (allowed to go flat) and ORS replace fluids and electrolytes.   Avoid:   Carbonated drinks.   Juice.   Extremely hot or cold fluids.   Caffeine drinks.   Fatty, greasy foods.   Alcohol.   Tobacco.   Too much intake of anything at one time.   Gelatin desserts.   Probiotics are active cultures of beneficial bacteria. They may lessen the amount and number of diarrheal stools in adults. Probiotics can be found in yogurt with active cultures and in supplements.   Wash hands well to avoid spreading bacteria and virus.   Anti-diarrheal medications are not recommended for infants and children.   Only take over-the-counter or prescription medicines for pain, discomfort or fever as directed by your caregiver. Do not give aspirin to children because it may cause Reye's Syndrome.   For adults, ask your caregiver if you should continue all prescribed and over-the-counter medicines.   If your caregiver has given you a follow-up appointment, it is very important to keep that appointment. Not keeping the appointment could result in a chronic or permanent injury, and disability. If there is any problem keeping the appointment, you must call back to this facility for assistance.  SEEK IMMEDIATE MEDICAL CARE IF:   You or your child is unable to keep fluids down or other symptoms or problems become worse in spite of treatment.   Vomiting or diarrhea develops and becomes persistent.     There is vomiting of blood or bile (green material).   There is blood in the stool or the stools are black and tarry.   There is no urine output in 6-8 hours or there is only a small amount of very dark urine.   Abdominal pain develops, increases or localizes.   You have a fever.   Your baby is older than 3 months with a  rectal temperature of 102 F (38.9 C) or higher.   Your baby is 3 months old or younger with a rectal temperature of 100.4 F (38 C) or higher.   You or your child develops excessive weakness, dizziness, fainting or extreme thirst.   You or your child develops a rash, stiff neck, severe headache or become irritable or sleepy and difficult to awaken.  MAKE SURE YOU:   Understand these instructions.   Will watch your condition.   Will get help right away if you are not doing well or get worse.  Document Released: 06/17/2002 Document Revised: 06/16/2011 Document Reviewed: 05/04/2009 [employer] Patient Information 2012 [employer]. 

## 2012-02-13 ENCOUNTER — Other Ambulatory Visit: Payer: Managed Care, Other (non HMO)

## 2012-02-23 ENCOUNTER — Other Ambulatory Visit: Payer: Self-pay | Admitting: Family Medicine

## 2012-02-28 ENCOUNTER — Encounter: Payer: Self-pay | Admitting: Family Medicine

## 2012-02-28 ENCOUNTER — Ambulatory Visit (INDEPENDENT_AMBULATORY_CARE_PROVIDER_SITE_OTHER): Payer: Managed Care, Other (non HMO) | Admitting: Family Medicine

## 2012-02-28 VITALS — BP 116/70 | HR 72 | Temp 98.2°F | Wt 197.4 lb

## 2012-02-28 DIAGNOSIS — R5383 Other fatigue: Secondary | ICD-10-CM

## 2012-02-28 DIAGNOSIS — J309 Allergic rhinitis, unspecified: Secondary | ICD-10-CM

## 2012-02-28 DIAGNOSIS — K625 Hemorrhage of anus and rectum: Secondary | ICD-10-CM

## 2012-02-28 DIAGNOSIS — R197 Diarrhea, unspecified: Secondary | ICD-10-CM

## 2012-02-28 DIAGNOSIS — R5381 Other malaise: Secondary | ICD-10-CM

## 2012-02-28 DIAGNOSIS — Z9109 Other allergy status, other than to drugs and biological substances: Secondary | ICD-10-CM

## 2012-02-28 MED ORDER — BECLOMETHASONE DIPROPIONATE 80 MCG/ACT NA AERS: 2.0000 | INHALATION_SPRAY | Freq: Every day | NASAL | Status: DC

## 2012-02-28 NOTE — Patient Instructions (Addendum)

## 2012-02-28 NOTE — Progress Notes (Signed)
  Subjective:     Dennis Moore is a 56 y.o. male here for evaluation of a cough. Onset of symptoms was several days ago. Symptoms have been unchanged since that time. The cough is dry and is aggravated by nothing. Associated symptoms include: chest pain and postnasal drip. Patient does not have a history of asthma. Patient does not have a history of environmental allergens. Patient has not traveled recently. Patient does not have a history of smoking. Patient has not had a previous chest x-ray. Patient has not had a PPD done.  The following portions of the patient's history were reviewed and updated as appropriate: allergies, current medications, past family history, past medical history, past social history, past surgical history and problem list.  Review of Systems Pertinent items are noted in HPI.    Objective:    Oxygen saturation 98% on room air BP 116/70  Pulse 72  Temp 98.2 F (36.8 C) (Oral)  Wt 197 lb 6.4 oz (89.54 kg)  SpO2 98% General appearance: alert, cooperative, appears stated age and no distress Ears: normal TM's and external ear canals both ears Nose: clear discharge, no congestion, turbinates normal, no sinus tenderness Throat: lips, mucosa, and tongue normal; teeth and gums normal Lungs: clear to auscultation bilaterally Heart: S1, S2 normal    Assessment:    URI with Post Nasal Drip    Plan:    Explained lack of efficacy of antibiotics in viral disease. Avoid exposure to tobacco smoke and fumes. Call if shortness of breath worsens, blood in sputum, change in character of cough, development of fever or chills, inability to maintain nutrition and hydration. Avoid exposure to tobacco smoke and fumes. Trial of antihistamines. Trial of steroid nasal spray.

## 2012-02-29 LAB — CBC WITH DIFFERENTIAL/PLATELET
Basophils Relative: 0.3 % (ref 0.0–3.0)
Eosinophils Relative: 1.3 % (ref 0.0–5.0)
HCT: 37.7 % — ABNORMAL LOW (ref 39.0–52.0)
Hemoglobin: 12.9 g/dL — ABNORMAL LOW (ref 13.0–17.0)
Lymphs Abs: 2.7 10*3/uL (ref 0.7–4.0)
MCV: 92.8 fl (ref 78.0–100.0)
Monocytes Relative: 4.7 % (ref 3.0–12.0)
Neutro Abs: 3.6 10*3/uL (ref 1.4–7.7)
RBC: 4.06 Mil/uL — ABNORMAL LOW (ref 4.22–5.81)
WBC: 6.8 10*3/uL (ref 4.5–10.5)

## 2012-02-29 LAB — BASIC METABOLIC PANEL
BUN: 17 mg/dL (ref 6–23)
CO2: 23 mEq/L (ref 19–32)
Calcium: 9.3 mg/dL (ref 8.4–10.5)
Creatinine, Ser: 1.3 mg/dL (ref 0.4–1.5)
Glucose, Bld: 100 mg/dL — ABNORMAL HIGH (ref 70–99)
Sodium: 138 mEq/L (ref 135–145)

## 2012-03-19 ENCOUNTER — Ambulatory Visit (INDEPENDENT_AMBULATORY_CARE_PROVIDER_SITE_OTHER): Payer: Managed Care, Other (non HMO) | Admitting: Family Medicine

## 2012-03-19 ENCOUNTER — Encounter: Payer: Self-pay | Admitting: Family Medicine

## 2012-03-19 ENCOUNTER — Other Ambulatory Visit: Payer: Self-pay

## 2012-03-19 VITALS — BP 110/80 | Temp 98.2°F | Wt 187.8 lb

## 2012-03-19 DIAGNOSIS — R5383 Other fatigue: Secondary | ICD-10-CM

## 2012-03-19 DIAGNOSIS — E785 Hyperlipidemia, unspecified: Secondary | ICD-10-CM

## 2012-03-19 LAB — BASIC METABOLIC PANEL
BUN: 20 mg/dL (ref 6–23)
Calcium: 9.2 mg/dL (ref 8.4–10.5)
Chloride: 99 mEq/L (ref 96–112)
Creatinine, Ser: 1.5 mg/dL (ref 0.4–1.5)

## 2012-03-19 LAB — TSH: TSH: 1.09 u[IU]/mL (ref 0.35–5.50)

## 2012-03-19 LAB — CBC WITH DIFFERENTIAL/PLATELET
Basophils Absolute: 0 10*3/uL (ref 0.0–0.1)
Eosinophils Absolute: 0.1 10*3/uL (ref 0.0–0.7)
HCT: 43.7 % (ref 39.0–52.0)
Lymphocytes Relative: 34.5 % (ref 12.0–46.0)
Lymphs Abs: 2.3 10*3/uL (ref 0.7–4.0)
Monocytes Relative: 5.4 % (ref 3.0–12.0)
Platelets: 274 10*3/uL (ref 150.0–400.0)
RDW: 16.4 % — ABNORMAL HIGH (ref 11.5–14.6)

## 2012-03-19 LAB — HEPATIC FUNCTION PANEL
AST: 75 U/L — ABNORMAL HIGH (ref 0–37)
Total Bilirubin: 0.5 mg/dL (ref 0.3–1.2)

## 2012-03-19 MED ORDER — LISINOPRIL-HYDROCHLOROTHIAZIDE 20-12.5 MG PO TABS: ORAL_TABLET | ORAL | Status: DC

## 2012-03-19 MED ORDER — FENOFIBRATE MICRONIZED 130 MG PO CAPS: 130.0000 mg | ORAL_CAPSULE | Freq: Every day | ORAL | Status: DC

## 2012-03-19 MED ORDER — ROSUVASTATIN CALCIUM 20 MG PO TABS: 20.0000 mg | ORAL_TABLET | Freq: Every day | ORAL | Status: DC

## 2012-03-19 NOTE — Patient Instructions (Signed)

## 2012-03-19 NOTE — Progress Notes (Signed)
  Subjective:     Dennis Moore is a 56 y.o. male who presents for evaluation of fatigue. Symptoms began 5 days ago. The patient feels the fatigue began with: fatigue, chills, headaches and dizzy. Symptoms of his fatigue have been diffuse soft tissue aches and pains and headaches. Patient describes the following psychological symptoms: none. Patient denies cold intolerance, constipation, fever, GI blood loss, significant change in weight, symptoms of arthritis, unusual rashes, witnessed or suspected sleep apnea and pt has been trying to lose weight. Symptoms have progressed to a point and plateaued. Symptom severity: struggles to carry out day to day responsibilities.. Previous visits for this problem: none.   The following portions of the patient's history were reviewed and updated as appropriate: allergies, current medications, past family history, past medical history, past social history, past surgical history and problem list.  Review of Systems Pertinent items are noted in HPI.    Objective:    BP 110/80  Temp 98.2 F (36.8 C) (Oral)  Wt 187 lb 12.8 oz (85.186 kg) General appearance: alert, cooperative, appears stated age and no distress Ears: normal TM's and external ear canals both ears Nose: Nares normal. Septum midline. Mucosa normal. No drainage or sinus tenderness. Throat: lips, mucosa, and tongue normal; teeth and gums normal Neck: mild anterior cervical adenopathy, supple, symmetrical, trachea midline and thyroid not enlarged, symmetric, no tenderness/mass/nodules Lungs: clear to auscultation bilaterally Heart: S1, S2 normal Neurologic: Alert and oriented X 3, normal strength and tone. Normal symmetric reflexes. Normal coordination and gait    Assessment:    fatigue    Plan:    Discussed diagnosis with patient. Reassured that serious underlying cause for the fatigue is very unlikely. Follow up in 2 weeks or as needed. check labs

## 2012-03-20 LAB — EPSTEIN-BARR VIRUS VCA ANTIBODY PANEL
EBV EA IgG: <5 U/mL (ref <9.0)
EBV NA IgG: 203 U/mL — ABNORMAL HIGH (ref <18.0)
EBV VCA IgM: <10 U/mL (ref <36.0)

## 2012-03-26 ENCOUNTER — Telehealth: Payer: Self-pay

## 2012-03-26 NOTE — Telephone Encounter (Signed)
Pt notified of lab results.  He states he drinks an occasional beer or two if he is watching a football game, but other than that nothing.  No increase in tylenol or other otc meds.  Two week f/u lab appt made.

## 2012-04-09 ENCOUNTER — Other Ambulatory Visit (INDEPENDENT_AMBULATORY_CARE_PROVIDER_SITE_OTHER): Payer: Managed Care, Other (non HMO)

## 2012-04-09 ENCOUNTER — Other Ambulatory Visit: Payer: Managed Care, Other (non HMO)

## 2012-04-09 LAB — HEPATIC FUNCTION PANEL: Total Bilirubin: 0.6 mg/dL (ref 0.3–1.2)

## 2012-04-10 LAB — HEPATITIS PANEL, ACUTE: Hepatitis B Surface Ag: NEGATIVE

## 2012-04-13 ENCOUNTER — Encounter: Payer: Self-pay | Admitting: Family Medicine

## 2012-04-13 ENCOUNTER — Ambulatory Visit (INDEPENDENT_AMBULATORY_CARE_PROVIDER_SITE_OTHER): Payer: Managed Care, Other (non HMO) | Admitting: Family Medicine

## 2012-04-13 VITALS — BP 132/70 | HR 84 | Temp 98.2°F | Resp 16 | Wt 180.0 lb

## 2012-04-13 DIAGNOSIS — J019 Acute sinusitis, unspecified: Secondary | ICD-10-CM

## 2012-04-13 MED ORDER — CLARITHROMYCIN ER 500 MG PO TB24: 1000.0000 mg | ORAL_TABLET | Freq: Every day | ORAL | Status: AC

## 2012-04-13 NOTE — Progress Notes (Signed)
  Subjective:    Patient ID: Dennis Moore, male    DOB: [birth year], 56 y.o.   MRN: [id number]  HPI Sinusitis- sxs started 'a few days ago'.  Last night developed severe facial pain, tooth pain, subjective fever.  No ear pain.  + nasal congestion.  No cough.  + PND.  + sick contacts.  Hx of frequent sinus infxns.  Allergic to PCN.   Review of Systems For ROS see HPI     Objective:   Physical Exam  Vitals reviewed. Constitutional: He appears well-developed and well-nourished. No distress.  HENT:  Head: Normocephalic and atraumatic.  Right Ear: Tympanic membrane normal.  Left Ear: Tympanic membrane normal.  Nose: Mucosal edema and rhinorrhea present. Right sinus exhibits maxillary sinus tenderness and frontal sinus tenderness. Left sinus exhibits maxillary sinus tenderness and frontal sinus tenderness.  Mouth/Throat: Mucous membranes are normal. Oropharyngeal exudate and posterior oropharyngeal erythema present. No posterior oropharyngeal edema.       + PND  Eyes: Conjunctivae normal and EOM are normal. Pupils are equal, round, and reactive to light.  Neck: Normal range of motion. Neck supple.  Cardiovascular: Normal rate, regular rhythm and normal heart sounds.   Pulmonary/Chest: Effort normal and breath sounds normal. No respiratory distress. He has no wheezes.       + hacking cough  Lymphadenopathy:    He has no cervical adenopathy.  Skin: Skin is warm and dry.          Assessment & Plan:

## 2012-04-13 NOTE — Patient Instructions (Addendum)
Start the Biaxin for the sinus infection- 2 tabs at the same time daily, w/ food Hold the lipitor while taking the biaxin Drink plenty of fluids Mucinex to thin your congestion REST! Hang in there!!!

## 2012-04-14 NOTE — Assessment & Plan Note (Signed)
sxs and PE consistent w/ infxn.  Start abx.  Reviewed supportive care and red flags that should prompt return.  Pt expressed understanding and is in agreement w/ plan.  

## 2012-04-16 ENCOUNTER — Ambulatory Visit: Payer: Managed Care, Other (non HMO) | Admitting: Family Medicine

## 2012-04-23 ENCOUNTER — Telehealth: Payer: Self-pay | Admitting: Family Medicine

## 2012-04-23 NOTE — Telephone Encounter (Signed)
Card given to patient  

## 2012-04-23 NOTE — Telephone Encounter (Signed)
Patient would like to know if he can get another copay card for his triglyceride medication. He lost the one we gave him.

## 2012-05-25 ENCOUNTER — Telehealth: Payer: Self-pay | Admitting: Family Medicine

## 2012-05-25 DIAGNOSIS — F528 Other sexual dysfunction not due to a substance or known physiological condition: Secondary | ICD-10-CM

## 2012-05-25 MED ORDER — VARDENAFIL HCL 20 MG PO TABS: 20.0000 mg | ORAL_TABLET | Freq: Every day | ORAL | Status: DC | PRN

## 2012-05-25 NOTE — Telephone Encounter (Signed)
No steroids but Please advise on Viagra     KP

## 2012-05-25 NOTE — Telephone Encounter (Signed)
How is he going to get samples if he is in [place]?   Can call in viagra #10 but he would have to be seen for steroids.

## 2012-05-25 NOTE — Telephone Encounter (Signed)
Patient is requesting an Rx for Levitra instead of Viagra. Please advise    KP

## 2012-05-25 NOTE — Telephone Encounter (Signed)
Patient called requesting samples of a steroid medication he was given about a year ago for his hip inflammation. He does not know the name of it. He would also like to know if we have any samples of levitra or viagra. If no samples available, pt would like rx called into [facility] in [place].

## 2012-05-25 NOTE — Telephone Encounter (Signed)
That is fine 

## 2012-05-30 ENCOUNTER — Ambulatory Visit (INDEPENDENT_AMBULATORY_CARE_PROVIDER_SITE_OTHER): Payer: Managed Care, Other (non HMO) | Admitting: Family Medicine

## 2012-05-30 ENCOUNTER — Encounter: Payer: Self-pay | Admitting: Family Medicine

## 2012-05-30 VITALS — BP 126/82 | HR 60 | Temp 98.2°F | Wt 195.8 lb

## 2012-05-30 DIAGNOSIS — M25559 Pain in unspecified hip: Secondary | ICD-10-CM

## 2012-05-30 DIAGNOSIS — M1610 Unilateral primary osteoarthritis, unspecified hip: Secondary | ICD-10-CM

## 2012-05-30 DIAGNOSIS — E785 Hyperlipidemia, unspecified: Secondary | ICD-10-CM

## 2012-05-30 DIAGNOSIS — M25551 Pain in right hip: Secondary | ICD-10-CM

## 2012-05-30 DIAGNOSIS — F528 Other sexual dysfunction not due to a substance or known physiological condition: Secondary | ICD-10-CM

## 2012-05-30 LAB — BASIC METABOLIC PANEL
CO2: 27 mEq/L (ref 19–32)
Glucose, Bld: 101 mg/dL — ABNORMAL HIGH (ref 70–99)
Potassium: 3.6 mEq/L (ref 3.5–5.1)
Sodium: 139 mEq/L (ref 135–145)

## 2012-05-30 LAB — LIPID PANEL: VLDL: 55.4 mg/dL — ABNORMAL HIGH (ref 0.0–40.0)

## 2012-05-30 LAB — HEPATIC FUNCTION PANEL
AST: 25 U/L (ref 0–37)
Albumin: 4.2 g/dL (ref 3.5–5.2)
Alkaline Phosphatase: 40 U/L (ref 39–117)
Total Protein: 7.6 g/dL (ref 6.0–8.3)

## 2012-05-30 MED ORDER — CELECOXIB 200 MG PO CAPS: 200.0000 mg | ORAL_CAPSULE | Freq: Two times a day (BID) | ORAL | Status: DC

## 2012-05-30 MED ORDER — ROSUVASTATIN CALCIUM 20 MG PO TABS: 20.0000 mg | ORAL_TABLET | Freq: Every day | ORAL | Status: DC

## 2012-05-30 NOTE — Progress Notes (Signed)
  Subjective:    Patient ID: Dennis Moore, male    DOB: 03/26/1956, 56 y.o.   MRN: [id number]  HPI Pt here f/u hip pain that is flaring --- pain is worse in am for a while and then eases off and cold weather worsens pain.  Celebrex really helps.   Motrin and aleve do nothing.     Review of Systems as above   Objective:   Physical Exam  Constitutional: He is oriented to person, place, and time. He appears well-developed and well-nourished.  Musculoskeletal:       B/l hip pain with weight bearing   Neurological: He is alert and oriented to person, place, and time.  Psychiatric: He has a normal mood and affect. His behavior is normal. Judgment and thought content normal.          Assessment & Plan:

## 2012-05-30 NOTE — Assessment & Plan Note (Signed)
oa-- refill celebrex F/u ortho if no better

## 2012-05-30 NOTE — Patient Instructions (Addendum)
Osteoarthritis Osteoarthritis is the most common form of arthritis. It is redness, soreness, and swelling (inflammation) affecting the cartilage. Cartilage acts as a cushion, covering the ends of bones where they meet to form a joint. CAUSES  Over time, the cartilage begins to wear away. This causes bone to rub on bone. This produces pain and stiffness in the affected joints. Factors that contribute to this problem are:  Excessive body weight.  Age.  Overuse of joints. SYMPTOMS   People with osteoarthritis usually experience joint pain, swelling, or stiffness.  Over time, the joint may lose its normal shape.  Small deposits of bone (osteophytes) may grow on the edges of the joint.  Bits of bone or cartilage can break off and float inside the joint space. This may cause more pain and damage.  Osteoarthritis can lead to depression, anxiety, feelings of helplessness, and limitations on daily activities. The most commonly affected joints are in the:  Ends of the fingers.  Thumbs.  Neck.  Lower back.  Knees.  Hips. DIAGNOSIS  Diagnosis is mostly based on your symptoms and exam. Tests may be helpful, including:  X-rays of the affected joint.  A computerized magnetic scan (MRI).  Blood tests to rule out other types of arthritis.  Joint fluid tests. This involves using a needle to draw fluid from the joint and examining the fluid under a microscope. TREATMENT  Goals of treatment are to control pain, improve joint function, maintain a normal body weight, and maintain a healthy lifestyle. Treatment approaches may include:  A prescribed exercise program with rest and joint relief.  Weight control with nutritional education.  Pain relief techniques such as:  Properly applied heat and cold.  Electric pulses delivered to nerve endings under the skin (transcutaneous electrical nerve stimulation, TENS).  Massage.  Certain supplements. Ask your caregiver before using any  supplements, especially in combination with prescribed drugs.  Medicines to control pain, such as:  Acetaminophen.  Nonsteroidal anti-inflammatory drugs (NSAIDs), such as naproxen.  Narcotic or central-acting agents, such as tramadol. This drug carries a risk of addiction and is generally prescribed for short-term use.  Corticosteroids. These can be given orally or as injection. This is a short-term treatment, not recommended for routine use.  Surgery to reposition the bones and relieve pain (osteotomy) or to remove loose pieces of bone and cartilage. Joint replacement may be needed in advanced states of osteoarthritis. HOME CARE INSTRUCTIONS  Your caregiver can recommend specific types of exercise. These may include:  Strengthening exercises. These are done to strengthen the muscles that support joints affected by arthritis. They can be performed with weights or with exercise bands to add resistance.  Aerobic activities. These are exercises, such as brisk walking or low-impact aerobics, that get your heart pumping. They can help keep your lungs and circulatory system in shape.  Range-of-motion activities. These keep your joints limber.  Balance and agility exercises. These help you maintain daily living skills. Learning about your condition and being actively involved in your care will help improve the course of your osteoarthritis. SEEK MEDICAL CARE IF:   You feel hot or your skin turns red.  You develop a rash in addition to your joint pain.  You have an oral temperature above 102 F (38.9 C). FOR MORE INFORMATION  National Institute of Arthritis and Musculoskeletal and Skin Diseases: www.niams.nih.gov National Institute on Aging: www.nia.nih.gov American College of Rheumatology: www.rheumatology.org Document Released: 06/27/2005 Document Revised: 09/19/2011 Document Reviewed: 10/08/2009 ExitCare Patient Information 2013 [employer].  

## 2012-05-30 NOTE — Assessment & Plan Note (Signed)
Refill levitra

## 2012-05-30 NOTE — Assessment & Plan Note (Signed)
Refill crestor

## 2012-07-02 ENCOUNTER — Ambulatory Visit: Payer: Managed Care, Other (non HMO) | Admitting: Internal Medicine

## 2012-07-06 ENCOUNTER — Encounter: Payer: Self-pay | Admitting: Internal Medicine

## 2012-07-06 ENCOUNTER — Ambulatory Visit (INDEPENDENT_AMBULATORY_CARE_PROVIDER_SITE_OTHER): Payer: Managed Care, Other (non HMO) | Admitting: Internal Medicine

## 2012-07-06 VITALS — BP 134/74 | HR 60 | Temp 97.7°F | Wt 202.0 lb

## 2012-07-06 DIAGNOSIS — R350 Frequency of micturition: Secondary | ICD-10-CM

## 2012-07-06 LAB — POCT URINALYSIS DIPSTICK
Bilirubin, UA: NEGATIVE
Ketones, UA: NEGATIVE
Leukocytes, UA: NEGATIVE
Nitrite, UA: NEGATIVE
Protein, UA: NEGATIVE

## 2012-07-06 NOTE — Assessment & Plan Note (Addendum)
Continue with urinary frequency, saw PCP 11/2011, prostate exam was normal, urine culture negative. PSA was normal 11-2011 and has been consistently normal.  Blood sugars also normal per chart review, on diuretics x years. He declined DRE today. Urine dip (-) Plan:  trial w/  toviaz 4 mg, 1 qd, #14 samples, will call if interested in RX (?[facility]) Also, would like to see about a testosterone checked, recommend to discuss with PCP

## 2012-07-06 NOTE — Progress Notes (Signed)
  Subjective:    Patient ID: Dennis Moore, male    DOB: October 23, 1955, 56 y.o.   MRN: [id number]  HPI Acute visit. Several months history of increased urinary frequency, usually goes to the bathroom 6 times a day, very seldom has nocturia. Wonders about low testosterone, on further questioning, he admits to some decreased energy, decreased libido and difficulty with erections.  Past Medical History  Diagnosis Date  . Depression   . Hyperlipidemia   . Hypertension   . Erectile dysfunction   . Tubular adenoma of colon 11/2008  . Internal hemorrhoids   . BPH (benign prostatic hypertrophy)   . Allergic rhinitis    Past Surgical History  Procedure Date  . Appendectomy   . Elbow surgery     right  . Carpal tunnel release     right  . Inguinal hernia repair     age 64   Review of Systems No fever or chills No dysuria gross hematuria. No difficulty urinating.    Objective:   Physical Exam General -- alert, well-developed, and well-nourished.   Abdomen--soft, non-tender, no distention, no masses, no HSM, no guarding, and no rigidity.  No CVA tenderness  Extremities-- no pretibial edema bilaterally Rectal--declined  Neurologic-- alert & oriented X3 and strength normal in all extremities. Psych-- Cognition and judgment appear intact. Alert and cooperative with normal attention span and concentration.  not anxious appearing and not depressed appearing.       Assessment & Plan:

## 2012-07-06 NOTE — Patient Instructions (Addendum)
take one tablet a day, see if that improve your urinary symptoms. If it does, call for a refill. Watch for side effects such as dry mouth, visual disturbances

## 2012-07-16 ENCOUNTER — Encounter: Payer: Self-pay | Admitting: Family Medicine

## 2012-07-16 ENCOUNTER — Ambulatory Visit (INDEPENDENT_AMBULATORY_CARE_PROVIDER_SITE_OTHER): Payer: Managed Care, Other (non HMO) | Admitting: Family Medicine

## 2012-07-16 VITALS — BP 114/60 | HR 70 | Temp 98.1°F | Wt 205.0 lb

## 2012-07-16 DIAGNOSIS — J029 Acute pharyngitis, unspecified: Secondary | ICD-10-CM | POA: Insufficient documentation

## 2012-07-16 DIAGNOSIS — R350 Frequency of micturition: Secondary | ICD-10-CM

## 2012-07-16 LAB — POCT URINALYSIS DIPSTICK
Glucose, UA: NEGATIVE
Ketones, UA: NEGATIVE
Leukocytes, UA: NEGATIVE
Protein, UA: NEGATIVE
Spec Grav, UA: 1.015

## 2012-07-16 LAB — POCT RAPID STREP A (OFFICE): Rapid Strep A Screen: NEGATIVE

## 2012-07-16 MED ORDER — MIRABEGRON ER 25 MG PO TB24: 25.0000 mg | ORAL_TABLET | Freq: Every day | ORAL | Status: DC

## 2012-07-16 NOTE — Progress Notes (Signed)
  Subjective:    Patient ID: Dennis Moore, male    DOB: [birth year], 57 y.o.   MRN: [id number]  HPI Pt is here to f/u prostate and urinary frequency.  Urine was neg and he did not let Dr Drue Novel do a prostate exam.  Pt also never went to urology for same symptoms.   He also c/o sore throat but states it is better.   Review of Systems As above    Objective:   Physical Exam  BP 114/60  Pulse 70  Temp 98.1 F (36.7 C) (Oral)  Wt 205 lb (92.987 kg)  SpO2 99% General appearance: alert, cooperative, appears stated age and no distress HEENT-- + errythema , no exudate Prostate exm--- non tender, slightly enlarged,  Smooth       Assessment & Plan:

## 2012-07-16 NOTE — Assessment & Plan Note (Addendum)
myrbetriq samples given Refer to urology Check labs Prostate is slightly enlarged

## 2012-07-16 NOTE — Assessment & Plan Note (Signed)
Strep neg Antihistamine otc Call or rto prn

## 2012-07-16 NOTE — Patient Instructions (Signed)
Urinary Incontinence Your doctor wants you to have this information about urinary incontinence. This is the inability to keep urine in your body until you decide to release it. CAUSES  Prostate gland enlargement is a common cause of urinary incontinence. But there are many different causes for losing urinary control. They include:  Medicines.  Infections.  Prostate problems.  Surgery.  Neurological diseases.  Emotional factors. DIAGNOSIS  Evaluating the cause of incontinence is important in choosing the best treatment. This may require:  An ultrasound exam.  Kidney and bladder X-rays.  Cystoscopy. This is an exam of the bladder using a narrow scope. TREATMENT  For incontinent patients, normal daily hygiene and using changing pads or adult diapers regularly will prevent offensive odors and skin damage from the moisture. Changing your medicines may help control incontinence. Your caregiver may prescribe some medicines to help you regain control. Avoid caffeine. It can over-stimulate the bladder. Use the bathroom regularly. Try about every 2 to 3 hours even if you do not feel the need. Take time to empty your bladder completely. After urinating, wait a minute. Then try to urinate again. External devices used to catch urine or an indwelling urine catheter (Foley catheter) may be needed as well. Some prostate gland problems require surgery to correct. Call your caregiver for more information. Document Released: 08/04/2004 Document Revised: 09/19/2011 Document Reviewed: 07/30/2008 ExitCare Patient Information 2013 [employer].  

## 2012-07-17 LAB — BASIC METABOLIC PANEL
CO2: 28 mEq/L (ref 19–32)
Chloride: 104 mEq/L (ref 96–112)
Glucose, Bld: 104 mg/dL — ABNORMAL HIGH (ref 70–99)
Sodium: 140 mEq/L (ref 135–145)

## 2012-07-17 LAB — HEPATIC FUNCTION PANEL
Albumin: 4.1 g/dL (ref 3.5–5.2)
Alkaline Phosphatase: 44 U/L (ref 39–117)
Total Bilirubin: 0.6 mg/dL (ref 0.3–1.2)

## 2012-07-17 LAB — PSA: PSA: 0.44 ng/mL (ref 0.10–4.00)

## 2012-07-31 ENCOUNTER — Telehealth: Payer: Self-pay | Admitting: Family Medicine

## 2012-07-31 DIAGNOSIS — E785 Hyperlipidemia, unspecified: Secondary | ICD-10-CM

## 2012-07-31 NOTE — Telephone Encounter (Signed)
Patient's wife states they want Antara sent to [facility] on [address]. They need something sent to [facility] releasing the prescription from [facility] so he can get it at [facility].

## 2012-07-31 NOTE — Telephone Encounter (Signed)
Discussed with patient and he stated he was told we needed to complete a form in order for him to get his med's at [facility]. I made the patient aware to call the insurance company and have them send the form to [place]. He agreed to do so.   KP

## 2012-08-02 ENCOUNTER — Other Ambulatory Visit: Payer: Self-pay

## 2012-08-02 DIAGNOSIS — E785 Hyperlipidemia, unspecified: Secondary | ICD-10-CM

## 2012-08-02 MED ORDER — FENOFIBRATE MICRONIZED 130 MG PO CAPS: 130.0000 mg | ORAL_CAPSULE | Freq: Every day | ORAL | Status: DC

## 2012-08-10 ENCOUNTER — Ambulatory Visit: Payer: Managed Care, Other (non HMO) | Admitting: Family Medicine

## 2012-08-13 ENCOUNTER — Encounter: Payer: Self-pay | Admitting: Family Medicine

## 2012-08-14 ENCOUNTER — Ambulatory Visit (INDEPENDENT_AMBULATORY_CARE_PROVIDER_SITE_OTHER): Payer: Managed Care, Other (non HMO) | Admitting: Family Medicine

## 2012-08-14 ENCOUNTER — Encounter: Payer: Self-pay | Admitting: Family Medicine

## 2012-08-14 VITALS — BP 126/80 | HR 69 | Temp 98.1°F | Wt 208.2 lb

## 2012-08-14 DIAGNOSIS — E785 Hyperlipidemia, unspecified: Secondary | ICD-10-CM

## 2012-08-14 DIAGNOSIS — R350 Frequency of micturition: Secondary | ICD-10-CM

## 2012-08-14 DIAGNOSIS — E781 Pure hyperglyceridemia: Secondary | ICD-10-CM

## 2012-08-14 MED ORDER — FENOFIBRATE 160 MG PO TABS: 160.0000 mg | ORAL_TABLET | Freq: Every day | ORAL | Status: DC

## 2012-08-14 NOTE — Assessment & Plan Note (Signed)
Cont crestor Switch to fenofibrate 160 mg  Labs in May

## 2012-08-14 NOTE — Assessment & Plan Note (Signed)
Pt will make urology appointment

## 2012-08-14 NOTE — Patient Instructions (Addendum)
Hypertriglyceridemia  Diet for High blood levels of Triglycerides Most fats in food are triglycerides. Triglycerides in your blood are stored as fat in your body. High levels of triglycerides in your blood may put you at a greater risk for heart disease and stroke.  Normal triglyceride levels are less than 150 mg/dL. Borderline high levels are 150-199 mg/dl. High levels are 200 - 499 mg/dL, and very high triglyceride levels are greater than 500 mg/dL. The decision to treat high triglycerides is generally based on the level. For people with borderline or high triglyceride levels, treatment includes weight loss and exercise. Drugs are recommended for people with very high triglyceride levels. Many people who need treatment for high triglyceride levels have metabolic syndrome. This syndrome is a collection of disorders that often include: insulin resistance, high blood pressure, blood clotting problems, high cholesterol and triglycerides. TESTING PROCEDURE FOR TRIGLYCERIDES  You should not eat 4 hours before getting your triglycerides measured. The normal range of triglycerides is between 10 and 250 milligrams per deciliter (mg/dl). Some people may have extreme levels (1000 or above), but your triglyceride level may be too high if it is above 150 mg/dl, depending on what other risk factors you have for heart disease.  People with high blood triglycerides may also have high blood cholesterol levels. If you have high blood cholesterol as well as high blood triglycerides, your risk for heart disease is probably greater than if you only had high triglycerides. High blood cholesterol is one of the main risk factors for heart disease. CHANGING YOUR DIET  Your weight can affect your blood triglyceride level. If you are more than 20% above your ideal body weight, you may be able to lower your blood triglycerides by losing weight. Eating less and exercising regularly is the best way to combat this. Fat provides more  calories than any other food. The best way to lose weight is to eat less fat. Only 30% of your total calories should come from fat. Less than 7% of your diet should come from saturated fat. A diet low in fat and saturated fat is the same as a diet to decrease blood cholesterol. By eating a diet lower in fat, you may lose weight, lower your blood cholesterol, and lower your blood triglyceride level.  Eating a diet low in fat, especially saturated fat, may also help you lower your blood triglyceride level. Ask your dietitian to help you figure how much fat you can eat based on the number of calories your caregiver has prescribed for you.  Exercise, in addition to helping with weight loss may also help lower triglyceride levels.   Alcohol can increase blood triglycerides. You may need to stop drinking alcoholic beverages.  Too much carbohydrate in your diet may also increase your blood triglycerides. Some complex carbohydrates are necessary in your diet. These may include bread, rice, potatoes, other starchy vegetables and cereals.  Reduce "simple" carbohydrates. These may include pure sugars, candy, honey, and jelly without losing other nutrients. If you have the kind of high blood triglycerides that is affected by the amount of carbohydrates in your diet, you will need to eat less sugar and less high-sugar foods. Your caregiver can help you with this.  Adding 2-4 grams of fish oil (EPA+ DHA) may also help lower triglycerides. Speak with your caregiver before adding any supplements to your regimen. Following the Diet  Maintain your ideal weight. Your caregivers can help you with a diet. Generally, eating less food and getting more   exercise will help you lose weight. Joining a weight control group may also help. Ask your caregivers for a good weight control group in your area.  Eat low-fat foods instead of high-fat foods. This can help you lose weight too.  These foods are lower in fat. Eat MORE of these:    Dried beans, peas, and lentils.  Egg whites.  Low-fat cottage cheese.  Fish.  Lean cuts of meat, such as round, sirloin, rump, and flank (cut extra fat off meat you fix).  Whole grain breads, cereals and pasta.  Skim and nonfat dry milk.  Low-fat yogurt.  Poultry without the skin.  Cheese made with skim or part-skim milk, such as mozzarella, parmesan, farmers', ricotta, or pot cheese. These are higher fat foods. Eat LESS of these:   Whole milk and foods made from whole milk, such as American, blue, cheddar, monterey jack, and swiss cheese  High-fat meats, such as luncheon meats, sausages, knockwurst, bratwurst, hot dogs, ribs, corned beef, ground pork, and regular ground beef.  Fried foods. Limit saturated fats in your diet. Substituting unsaturated fat for saturated fat may decrease your blood triglyceride level. You will need to read package labels to know which products contain saturated fats.  These foods are high in saturated fat. Eat LESS of these:   Fried pork skins.  Whole milk.  Skin and fat from poultry.  Palm oil.  Butter.  Shortening.  Cream cheese.  Bacon.  Margarines and baked goods made from listed oils.  Vegetable shortenings.  Chitterlings.  Fat from meats.  Coconut oil.  Palm kernel oil.  Lard.  Cream.  Sour cream.  Fatback.  Coffee whiteners and non-dairy creamers made with these oils.  Cheese made from whole milk. Use unsaturated fats (both polyunsaturated and monounsaturated) moderately. Remember, even though unsaturated fats are better than saturated fats; you still want a diet low in total fat.  These foods are high in unsaturated fat:   Canola oil.  Sunflower oil.  Mayonnaise.  Almonds.  Peanuts.  Pine nuts.  Margarines made with these oils.  Safflower oil.  Olive oil.  Avocados.  Cashews.  Peanut butter.  Sunflower seeds.  Soybean oil.  Peanut  oil.  Olives.  Pecans.  Walnuts.  Pumpkin seeds. Avoid sugar and other high-sugar foods. This will decrease carbohydrates without decreasing other nutrients. Sugar in your food goes rapidly to your blood. When there is excess sugar in your blood, your liver may use it to make more triglycerides. Sugar also contains calories without other important nutrients.  Eat LESS of these:   Sugar, brown sugar, powdered sugar, jam, jelly, preserves, honey, syrup, molasses, pies, candy, cakes, cookies, frosting, pastries, colas, soft drinks, punches, fruit drinks, and regular gelatin.  Avoid alcohol. Alcohol, even more than sugar, may increase blood triglycerides. In addition, alcohol is high in calories and low in nutrients. Ask for sparkling water, or a diet soft drink instead of an alcoholic beverage. Suggestions for planning and preparing meals   Bake, broil, grill or roast meats instead of frying.  Remove fat from meats and skin from poultry before cooking.  Add spices, herbs, lemon juice or vinegar to vegetables instead of salt, rich sauces or gravies.  Use a non-stick skillet without fat or use no-stick sprays.  Cool and refrigerate stews and broth. Then remove the hardened fat floating on the surface before serving.  Refrigerate meat drippings and skim off fat to make low-fat gravies.  Serve more fish.  Use less butter,   margarine and other high-fat spreads on bread or vegetables.  Use skim or reconstituted non-fat dry milk for cooking.  Cook with low-fat cheeses.  Substitute low-fat yogurt or cottage cheese for all or part of the sour cream in recipes for sauces, dips or congealed salads.  Use half yogurt/half mayonnaise in salad recipes.  Substitute evaporated skim milk for cream. Evaporated skim milk or reconstituted non-fat dry milk can be whipped and substituted for whipped cream in certain recipes.  Choose fresh fruits for dessert instead of high-fat foods such as pies or  cakes. Fruits are naturally low in fat. When Dining Out   Order low-fat appetizers such as fruit or vegetable juice, pasta with vegetables or tomato sauce.  Select clear, rather than cream soups.  Ask that dressings and gravies be served on the side. Then use less of them.  Order foods that are baked, broiled, poached, steamed, stir-fried, or roasted.  Ask for margarine instead of butter, and use only a small amount.  Drink sparkling water, unsweetened tea or coffee, or diet soft drinks instead of alcohol or other sweet beverages. QUESTIONS AND ANSWERS ABOUT OTHER FATS IN THE BLOOD: SATURATED FAT, TRANS FAT, AND CHOLESTEROL What is trans fat? Trans fat is a type of fat that is formed when vegetable oil is hardened through a process called hydrogenation. This process helps makes foods more solid, gives them shape, and prolongs their shelf life. Trans fats are also called hydrogenated or partially hydrogenated oils.  What do saturated fat, trans fat, and cholesterol in foods have to do with heart disease? Saturated fat, trans fat, and cholesterol in the diet all raise the level of LDL "bad" cholesterol in the blood. The higher the LDL cholesterol, the greater the risk for coronary heart disease (CHD). Saturated fat and trans fat raise LDL similarly.  What foods contain saturated fat, trans fat, and cholesterol? High amounts of saturated fat are found in animal products, such as fatty cuts of meat, chicken skin, and full-fat dairy products like butter, whole milk, cream, and cheese, and in tropical vegetable oils such as palm, palm kernel, and coconut oil. Trans fat is found in some of the same foods as saturated fat, such as vegetable shortening, some margarines (especially hard or stick margarine), crackers, cookies, baked goods, fried foods, salad dressings, and other processed foods made with partially hydrogenated vegetable oils. Small amounts of trans fat also occur naturally in some animal  products, such as milk products, beef, and lamb. Foods high in cholesterol include liver, other organ meats, egg yolks, shrimp, and full-fat dairy products. How can I use the new food label to make heart-healthy food choices? Check the Nutrition Facts panel of the food label. Choose foods lower in saturated fat, trans fat, and cholesterol. For saturated fat and cholesterol, you can also use the Percent Daily Value (%DV): 5% DV or less is low, and 20% DV or more is high. (There is no %DV for trans fat.) Use the Nutrition Facts panel to choose foods low in saturated fat and cholesterol, and if the trans fat is not listed, read the ingredients and limit products that list shortening or hydrogenated or partially hydrogenated vegetable oil, which tend to be high in trans fat. POINTS TO REMEMBER:   Discuss your risk for heart disease with your caregivers, and take steps to reduce risk factors.  Change your diet. Choose foods that are low in saturated fat, trans fat, and cholesterol.  Add exercise to your daily routine if   it is not already being done. Participate in physical activity of moderate intensity, like brisk walking, for at least 30 minutes on most, and preferably all days of the week. No time? Break the 30 minutes into three, 10-minute segments during the day.  Stop smoking. If you do smoke, contact your caregiver to discuss ways in which they can help you quit.  Do not use street drugs.  Maintain a normal weight.  Maintain a healthy blood pressure.  Keep up with your blood work for checking the fats in your blood as directed by your caregiver. Document Released: 04/14/2004 Document Revised: 12/27/2011 Document Reviewed: 11/10/2008 Exit[employer] Patient Information 2013 [employer].  

## 2012-08-14 NOTE — Progress Notes (Signed)
  Subjective:    Patient ID: Dennis Moore, male    DOB: 09-28-1955, 57 y.o.   MRN: [id number]  HPI Pt here to discuss meds.  [facility] is too expensive-- he would like to switch med.  No other complaints.   Review of Systems As above    Objective:   Physical Exam BP 126/80  Pulse 69  Temp 98.1 F (36.7 C) (Oral)  Wt 208 lb 3.2 oz (94.439 kg)  SpO2 97% General appearance: alert, cooperative, appears stated age and no distress       Assessment & Plan:

## 2012-08-17 ENCOUNTER — Telehealth: Payer: Self-pay

## 2012-08-17 DIAGNOSIS — E781 Pure hyperglyceridemia: Secondary | ICD-10-CM

## 2012-08-17 MED ORDER — FENOFIBRATE 160 MG PO TABS: 160.0000 mg | ORAL_TABLET | Freq: Every day | ORAL | Status: DC

## 2012-08-17 NOTE — Telephone Encounter (Signed)
Rx requested to go to [facility]. Rx sent      KP

## 2012-08-17 NOTE — Telephone Encounter (Signed)
Patient would like a note in his chart stating to never send any prescriptions to [facility]. He does not use their pharmacy.

## 2012-08-23 ENCOUNTER — Encounter (HOSPITAL_BASED_OUTPATIENT_CLINIC_OR_DEPARTMENT_OTHER): Payer: Self-pay | Admitting: *Deleted

## 2012-08-23 ENCOUNTER — Emergency Department (HOSPITAL_BASED_OUTPATIENT_CLINIC_OR_DEPARTMENT_OTHER)
Admission: EM | Admit: 2012-08-23 | Discharge: 2012-08-23 | Disposition: A | Discharge status: Home / Self Care | Payer: Managed Care, Other (non HMO) | Attending: Emergency Medicine | Admitting: Emergency Medicine

## 2012-08-23 DIAGNOSIS — E785 Hyperlipidemia, unspecified: Secondary | ICD-10-CM | POA: Insufficient documentation

## 2012-08-23 DIAGNOSIS — W010XXA Fall on same level from slipping, tripping and stumbling without subsequent striking against object, initial encounter: Secondary | ICD-10-CM | POA: Insufficient documentation

## 2012-08-23 DIAGNOSIS — Z8679 Personal history of other diseases of the circulatory system: Secondary | ICD-10-CM | POA: Insufficient documentation

## 2012-08-23 DIAGNOSIS — N529 Male erectile dysfunction, unspecified: Secondary | ICD-10-CM | POA: Insufficient documentation

## 2012-08-23 DIAGNOSIS — Z860100 Personal history of colon polyps, unspecified: Secondary | ICD-10-CM | POA: Insufficient documentation

## 2012-08-23 DIAGNOSIS — Y929 Unspecified place or not applicable: Secondary | ICD-10-CM | POA: Insufficient documentation

## 2012-08-23 DIAGNOSIS — Z8659 Personal history of other mental and behavioral disorders: Secondary | ICD-10-CM | POA: Insufficient documentation

## 2012-08-23 DIAGNOSIS — S025XXA Fracture of tooth (traumatic), initial encounter for closed fracture: Secondary | ICD-10-CM | POA: Insufficient documentation

## 2012-08-23 DIAGNOSIS — Z87448 Personal history of other diseases of urinary system: Secondary | ICD-10-CM | POA: Insufficient documentation

## 2012-08-23 DIAGNOSIS — Y9389 Activity, other specified: Secondary | ICD-10-CM | POA: Insufficient documentation

## 2012-08-23 DIAGNOSIS — I10 Essential (primary) hypertension: Secondary | ICD-10-CM | POA: Insufficient documentation

## 2012-08-23 DIAGNOSIS — W1809XA Striking against other object with subsequent fall, initial encounter: Secondary | ICD-10-CM | POA: Insufficient documentation

## 2012-08-23 DIAGNOSIS — K0889 Other specified disorders of teeth and supporting structures: Secondary | ICD-10-CM

## 2012-08-23 MED ORDER — OXYCODONE-ACETAMINOPHEN 5-325 MG PO TABS
1.0000 | ORAL_TABLET | Freq: Once | ORAL | Status: AC
  Administered 2012-08-23: 1 via ORAL
  Filled 2012-08-23 (×2): qty 1

## 2012-08-23 MED ORDER — CLINDAMYCIN HCL 150 MG PO CAPS
300.0000 mg | ORAL_CAPSULE | Freq: Once | ORAL | Status: AC
  Administered 2012-08-23: 300 mg via ORAL
  Filled 2012-08-23: qty 2

## 2012-08-23 MED ORDER — OXYCODONE-ACETAMINOPHEN 5-325 MG PO TABS: 1.0000 | ORAL_TABLET | ORAL | Status: DC | PRN

## 2012-08-23 MED ORDER — CLINDAMYCIN HCL 300 MG PO CAPS: 300.0000 mg | ORAL_CAPSULE | Freq: Four times a day (QID) | ORAL | Status: DC

## 2012-08-23 NOTE — ED Provider Notes (Signed)
History     CSN: [id number]  Arrival date & time 08/23/12  2018   First MD Initiated Contact with Patient 08/23/12 2039      Chief Complaint  Patient presents with  . Dental Problem    (Consider location/radiation/quality/duration/timing/severity/associated sxs/prior treatment) HPI Comments: Patient is standing on his, kind scrapes of his car initiated. His foot slipped and he fell, striking his mouth against the edge of the car door's opening.  He broke a bridge in the upper left side of his mouth.  He had pain in his mouth, and has been taking over-the-counter medication without relief. He called his dentist office and found to test for appointment for next Monday, 4 days from now. He therefore sought evaluation.  Patient is a 57 y.o. male presenting with dental injury. The history is provided by the patient.  Dental Injury This is a new problem. The current episode started 6 to 12 hours ago. The problem occurs constantly. The problem has not changed since onset.Associated symptoms comments: None. Exacerbated by: Swallowing something hot or cold. Nothing relieves the symptoms. Treatments tried: OTC pain medicines. The treatment provided no relief.    Past Medical History  Diagnosis Date  . Depression   . Hyperlipidemia   . Hypertension   . Erectile dysfunction   . Tubular adenoma of colon 11/2008  . Internal hemorrhoids   . BPH (benign prostatic hypertrophy)   . Allergic rhinitis     Past Surgical History  Procedure Laterality Date  . Appendectomy    . Elbow surgery      right  . Carpal tunnel release      right  . Inguinal hernia repair      age 57    Family History  Problem Relation Age of Onset  . Alzheimer's disease Paternal Uncle   . Parkinsonism Father   . Hyperlipidemia Father   . Hypertension Father   . Colon cancer Maternal Uncle   . Heart disease Father   . Heart disease Mother     History  Substance Use Topics  . Smoking status: Never Smoker   .  Smokeless tobacco: Never Used  . Alcohol Use: No      Review of Systems  All other systems reviewed and are negative.    Allergies  Penicillins  Home Medications   Current Outpatient Rx  Name  Route  Sig  Dispense  Refill  . celecoxib (CELEBREX) 200 MG capsule   Oral   Take 1 capsule (200 mg total) by mouth 2 (two) times daily.   30 capsule   5   . clindamycin (CLEOCIN) 300 MG capsule   Oral   Take 1 capsule (300 mg total) by mouth 4 (four) times daily.   20 capsule   0   . fenofibrate 160 MG tablet   Oral   Take 1 tablet (160 mg total) by mouth daily.   90 tablet   3   . lisinopril-hydrochlorothiazide (PRINZIDE,ZESTORETIC) 20-12.5 MG per tablet      1 po qd   90 tablet   1   . mirabegron ER (MYRBETRIQ) 25 MG TB24   Oral   Take 1 tablet (25 mg total) by mouth daily.   30 tablet   0   . oxyCODONE-acetaminophen (PERCOCET/ROXICET) 5-325 MG per tablet   Oral   Take 1 tablet by mouth every 4 (four) hours as needed for pain.   20 tablet   0   . rosuvastatin (CRESTOR) 20 MG  tablet   Oral   Take 1 tablet (20 mg total) by mouth daily.   90 tablet   3   . vardenafil (LEVITRA) 20 MG tablet   Oral   Take 1 tablet (20 mg total) by mouth daily as needed for erectile dysfunction.   10 tablet   0     BP 148/71  Pulse 77  Temp(Src) 97.7 F (36.5 C) (Oral)  Resp 18  SpO2 100%  Physical Exam  Nursing note and vitals reviewed. Constitutional: He is oriented to person, place, and time. He appears well-developed and well-nourished. Distressed: in moderate distress with pain in his mouth.  HENT:  Head: Normocephalic and atraumatic.  Right Ear: External ear normal.  Left Ear: External ear normal.  He has severely decayed teeth in the left upper chest. They are surrounded by tenderness and reddened gingiva.  Eyes: Conjunctivae and EOM are normal. Pupils are equal, round, and reactive to light.  Neck: Normal range of motion. Neck supple.  Cardiovascular:  Normal rate, regular rhythm and normal heart sounds.   Pulmonary/Chest: Effort normal and breath sounds normal.  Abdominal: Soft. Bowel sounds are normal.  Musculoskeletal: Normal range of motion.  Neurological: He is alert and oriented to person, place, and time.  No sensory or motor deficit.  Skin: Skin is warm and dry.  Psychiatric: He has a normal mood and affect. His behavior is normal.    ED Course  Procedures (including critical care time)  Rx with Clindamycin 300 mg now and qid x 5 days, and Percocet now and q4h prn pain.  Given the name of Dr. Mayford Knife, on call for general dentistry, to call in AM to be seen.     1. Pain, dental             Carleene Cooper III, MD 08/23/12 2209

## 2012-08-23 NOTE — ED Notes (Signed)
Hit is mouth against his SUV this am and knocked his bridge out. Mouth pain.

## 2012-09-07 ENCOUNTER — Ambulatory Visit (INDEPENDENT_AMBULATORY_CARE_PROVIDER_SITE_OTHER): Payer: Managed Care, Other (non HMO) | Admitting: Family Medicine

## 2012-09-07 ENCOUNTER — Encounter: Payer: Self-pay | Admitting: Family Medicine

## 2012-09-07 VITALS — BP 108/62 | HR 83 | Temp 97.4°F | Wt 201.8 lb

## 2012-09-07 DIAGNOSIS — R1032 Left lower quadrant pain: Secondary | ICD-10-CM

## 2012-09-07 MED ORDER — FENOFIBRATE MICRONIZED 130 MG PO CAPS: 130.0000 mg | ORAL_CAPSULE | Freq: Every day | ORAL | Status: DC

## 2012-09-07 NOTE — Patient Instructions (Signed)
Hernia A hernia occurs when an internal organ pushes out through a weak spot in the abdominal wall. Hernias most commonly occur in the groin and around the navel. Hernias often can be pushed back into place (reduced). Most hernias tend to get worse over time. Some abdominal hernias can get stuck in the opening (irreducible or incarcerated hernia) and cannot be reduced. An irreducible abdominal hernia which is tightly squeezed into the opening is at risk for impaired blood supply (strangulated hernia). A strangulated hernia is a medical emergency. Because of the risk for an irreducible or strangulated hernia, surgery may be recommended to repair a hernia. CAUSES   Heavy lifting.  Prolonged coughing.  Straining to have a bowel movement.  A cut (incision) made during an abdominal surgery. HOME CARE INSTRUCTIONS   Bed rest is not required. You may continue your normal activities.  Avoid lifting more than 10 pounds (4.5 kg) or straining.  Cough gently. If you are a smoker it is best to stop. Even the best hernia repair can break down with the continual strain of coughing. Even if you do not have your hernia repaired, a cough will continue to aggravate the problem.  Do not wear anything tight over your hernia. Do not try to keep it in with an outside bandage or truss. These can damage abdominal contents if they are trapped within the hernia sac.  Eat a normal diet.  Avoid constipation. Straining over long periods of time will increase hernia size and encourage breakdown of repairs. If you cannot do this with diet alone, stool softeners may be used. SEEK IMMEDIATE MEDICAL CARE IF:   You have a fever.  You develop increasing abdominal pain.  You feel nauseous or vomit.  Your hernia is stuck outside the abdomen, looks discolored, feels hard, or is tender.  You have any changes in your bowel habits or in the hernia that are unusual for you.  You have increased pain or swelling around the  hernia.  You cannot push the hernia back in place by applying gentle pressure while lying down. MAKE SURE YOU:   Understand these instructions.  Will watch your condition.  Will get help right away if you are not doing well or get worse. Document Released: 06/27/2005 Document Revised: 09/19/2011 Document Reviewed: 02/14/2008 [employer] Patient Information 2013 [employer].  

## 2012-09-07 NOTE — Progress Notes (Signed)
  Subjective:    Patient ID: Dennis Moore, male    DOB: August 27, 1955, 57 y.o.   MRN: [id number]  HPI Pt here c/o L groin pain after walking up steps lifting something heavy.  It has been painful since. No other complaints.     Review of Systems    as above  Objective:   Physical Exam BP 108/62  Pulse 83  Temp(Src) 97.4 F (36.3 C) (Oral)  Wt 201 lb 12.8 oz (91.536 kg)  BMI 27.36 kg/m2  SpO2 98% General appearance: alert, cooperative, appears stated age and no distress Abdomen: normal findings: no masses palpable and abnormal findings:  mild tenderness in the LLQ      Assessment & Plan:

## 2012-09-08 DIAGNOSIS — R1032 Left lower quadrant pain: Secondary | ICD-10-CM | POA: Insufficient documentation

## 2012-09-08 NOTE — Assessment & Plan Note (Signed)
?   Small hernia Check CT abd pelvis

## 2012-09-12 ENCOUNTER — Other Ambulatory Visit: Payer: Managed Care, Other (non HMO)

## 2012-09-18 ENCOUNTER — Other Ambulatory Visit: Payer: Managed Care, Other (non HMO)

## 2012-10-11 ENCOUNTER — Encounter: Payer: Self-pay | Admitting: Family Medicine

## 2012-10-11 ENCOUNTER — Ambulatory Visit (INDEPENDENT_AMBULATORY_CARE_PROVIDER_SITE_OTHER): Payer: Managed Care, Other (non HMO) | Admitting: Family Medicine

## 2012-10-11 VITALS — BP 118/64 | HR 75 | Temp 97.5°F | Wt 201.4 lb

## 2012-10-11 DIAGNOSIS — E785 Hyperlipidemia, unspecified: Secondary | ICD-10-CM

## 2012-10-11 DIAGNOSIS — J209 Acute bronchitis, unspecified: Secondary | ICD-10-CM

## 2012-10-11 MED ORDER — METHYLPREDNISOLONE ACETATE 80 MG/ML IJ SUSP
80.0000 mg | Freq: Once | INTRAMUSCULAR | Status: AC
  Administered 2012-10-11: 80 mg via INTRAMUSCULAR

## 2012-10-11 MED ORDER — FENOFIBRATE MICRONIZED 130 MG PO CAPS: 130.0000 mg | ORAL_CAPSULE | Freq: Every day | ORAL | Status: DC

## 2012-10-11 MED ORDER — GUAIFENESIN-CODEINE 100-10 MG/5ML PO SYRP
ORAL_SOLUTION | ORAL | Status: DC
Start: 2012-10-11 — End: 2012-10-30

## 2012-10-11 MED ORDER — PREDNISONE 10 MG PO TABS: ORAL_TABLET | ORAL | Status: DC

## 2012-10-11 MED ORDER — ALBUTEROL SULFATE (2.5 MG/3ML) 0.083% IN NEBU
2.5000 mg | INHALATION_SOLUTION | Freq: Once | RESPIRATORY_TRACT | Status: AC
  Administered 2012-10-11: 2.5 mg via RESPIRATORY_TRACT

## 2012-10-11 MED ORDER — ALBUTEROL SULFATE HFA 108 (90 BASE) MCG/ACT IN AERS: 2.0000 | INHALATION_SPRAY | Freq: Four times a day (QID) | RESPIRATORY_TRACT | Status: DC | PRN

## 2012-10-11 NOTE — Progress Notes (Signed)
  Subjective:     Dennis Moore is a 57 y.o. male here for evaluation of a cough. Onset of symptoms was 5 days ago. Symptoms have been gradually worsening since that time. The cough is productive and is aggravated by cold air, infection and reclining position. Associated symptoms include: postnasal drip, shortness of breath, sputum production and wheezing. Patient does not have a history of asthma. Patient does not have a history of environmental allergens. Patient has traveled recently. Patient does not have a history of smoking. Patient not had a previous chest x-ray. Patient has not had a PPD done.  The following portions of the patient's history were reviewed and updated as appropriate: allergies, current medications, past family history, past medical history, past social history, past surgical history and problem list.  Review of Systems Pertinent items are noted in HPI.    Objective:    Oxygen saturation 98% on room air BP 118/64  Pulse 75  Temp(Src) 97.5 F (36.4 C) (Oral)  Wt 201 lb 6.4 oz (91.354 kg)  BMI 27.31 kg/m2  SpO2 98% General appearance: alert Head: Normocephalic, without obvious abnormality, atraumatic Ears: normal TM's and external ear canals both ears Nose: Nares normal. Septum midline. Mucosa normal. No drainage or sinus tenderness. Throat: lips, mucosa, and tongue normal; teeth and gums normal Neck: no adenopathy, supple, symmetrical, trachea midline and thyroid not enlarged, symmetric, no tenderness/mass/nodules Lungs: wheezes bilaterally Heart: S1, S2 normal    Assessment:    Acute Bronchitis    Plan:    Antibiotics per medication orders. Antitussives per medication orders. Avoid exposure to tobacco smoke and fumes. B-agonist inhaler. Call if shortness of breath worsens, blood in sputum, change in character of cough, development of fever or chills, inability to maintain nutrition and hydration. Avoid exposure to tobacco smoke and fumes. Trial of  antihistamines. r/u prn

## 2012-10-11 NOTE — Patient Instructions (Signed)

## 2012-10-30 ENCOUNTER — Encounter: Payer: Self-pay | Admitting: Family Medicine

## 2012-10-30 ENCOUNTER — Ambulatory Visit (INDEPENDENT_AMBULATORY_CARE_PROVIDER_SITE_OTHER): Payer: Managed Care, Other (non HMO) | Admitting: Family Medicine

## 2012-10-30 VITALS — BP 130/68 | HR 77 | Temp 98.4°F | Wt 204.8 lb

## 2012-10-30 DIAGNOSIS — J322 Chronic ethmoidal sinusitis: Secondary | ICD-10-CM

## 2012-10-30 DIAGNOSIS — J019 Acute sinusitis, unspecified: Secondary | ICD-10-CM

## 2012-10-30 MED ORDER — LEVOFLOXACIN 500 MG PO TABS: 500.0000 mg | ORAL_TABLET | Freq: Every day | ORAL | Status: DC

## 2012-10-30 NOTE — Patient Instructions (Signed)

## 2012-10-30 NOTE — Progress Notes (Signed)
  Subjective:     Dennis Moore is a 57 y.o. male who presents for evaluation of sinus pain. Symptoms include: congestion, facial pain, headaches, nasal congestion and sinus pressure. Onset of symptoms was 2 days ago. Symptoms have been gradually worsening since that time. Past history is significant for no history of pneumonia or bronchitis. Patient is a non-smoker.  The following portions of the patient's history were reviewed and updated as appropriate: allergies, current medications, past family history, past medical history, past social history, past surgical history and problem list.  Review of Systems Pertinent items are noted in HPI.   Objective:    BP 130/68  Pulse 77  Temp(Src) 98.4 F (36.9 C) (Oral)  Wt 204 lb 12.8 oz (92.897 kg)  BMI 27.77 kg/m2  SpO2 97% General appearance: alert, cooperative, appears stated age and no distress Ears: normal TM's and external ear canals both ears Nose: green discharge, moderate congestion, turbinates normal, red, swollen, sinus tenderness bilateral Throat: lips, mucosa, and tongue normal; teeth and gums normal Neck: mild anterior cervical adenopathy, supple, symmetrical, trachea midline and thyroid not enlarged, symmetric, no tenderness/mass/nodules Lungs: clear to auscultation bilaterally Heart: S1, S2 normal Extremities: extremities normal, atraumatic, no cyanosis or edema Lymph nodes: Cervical, supraclavicular, and axillary nodes normal.    Assessment:    Acute bacterial sinusitis.    Plan:    Nasal steroids per medication orders. Antihistamines per medication orders. Augmentin per medication orders.

## 2012-12-11 ENCOUNTER — Ambulatory Visit (INDEPENDENT_AMBULATORY_CARE_PROVIDER_SITE_OTHER): Payer: Managed Care, Other (non HMO) | Admitting: Family Medicine

## 2012-12-11 ENCOUNTER — Encounter: Payer: Self-pay | Admitting: Family Medicine

## 2012-12-11 VITALS — BP 144/82 | HR 66 | Temp 98.6°F | Wt 196.0 lb

## 2012-12-11 DIAGNOSIS — E739 Lactose intolerance, unspecified: Secondary | ICD-10-CM

## 2012-12-11 DIAGNOSIS — I10 Essential (primary) hypertension: Secondary | ICD-10-CM

## 2012-12-11 DIAGNOSIS — R7309 Other abnormal glucose: Secondary | ICD-10-CM

## 2012-12-11 DIAGNOSIS — E785 Hyperlipidemia, unspecified: Secondary | ICD-10-CM

## 2012-12-11 DIAGNOSIS — R739 Hyperglycemia, unspecified: Secondary | ICD-10-CM

## 2012-12-11 LAB — CBC WITH DIFFERENTIAL/PLATELET
Basophils Relative: 0.2 % (ref 0.0–3.0)
Eosinophils Relative: 0.9 % (ref 0.0–5.0)
Monocytes Relative: 6.6 % (ref 3.0–12.0)
Neutrophils Relative %: 64.9 % (ref 43.0–77.0)
Platelets: 296 10*3/uL (ref 150.0–400.0)
RBC: 4.17 Mil/uL — ABNORMAL LOW (ref 4.22–5.81)
WBC: 7.6 10*3/uL (ref 4.5–10.5)

## 2012-12-11 LAB — HEPATIC FUNCTION PANEL
ALT: 74 U/L — ABNORMAL HIGH (ref 0–53)
AST: 69 U/L — ABNORMAL HIGH (ref 0–37)
Alkaline Phosphatase: 51 U/L (ref 39–117)
Bilirubin, Direct: 0 mg/dL (ref 0.0–0.3)
Total Bilirubin: 1.2 mg/dL (ref 0.3–1.2)

## 2012-12-11 LAB — POCT URINALYSIS DIPSTICK
Bilirubin, UA: NEGATIVE
Blood, UA: NEGATIVE
Glucose, UA: NEGATIVE
Ketones, UA: NEGATIVE
Spec Grav, UA: 1.015
pH, UA: 6.5

## 2012-12-11 LAB — BASIC METABOLIC PANEL
BUN: 12 mg/dL (ref 6–23)
Chloride: 102 mEq/L (ref 96–112)
Creatinine, Ser: 1.2 mg/dL (ref 0.4–1.5)
GFR: 84.12 mL/min (ref >60.00)
Potassium: 4 mEq/L (ref 3.5–5.1)

## 2012-12-11 LAB — LIPID PANEL
Cholesterol: 372 mg/dL — ABNORMAL HIGH (ref 0–200)
Total CHOL/HDL Ratio: 4
Triglycerides: 445 mg/dL — ABNORMAL HIGH (ref 0.0–149.0)

## 2012-12-11 LAB — LDL CHOLESTEROL, DIRECT: Direct LDL: 134.1 mg/dL

## 2012-12-11 LAB — HEMOGLOBIN A1C: Hgb A1c MFr Bld: 5.5 % (ref 4.6–6.5)

## 2012-12-11 LAB — MICROALBUMIN / CREATININE URINE RATIO
Creatinine,U: 316.3 mg/dL
Microalb Creat Ratio: 1.1 mg/g (ref 0.0–30.0)

## 2012-12-11 MED ORDER — FENOFIBRATE MICRONIZED 90 MG PO CAPS: 1.0000 | ORAL_CAPSULE | Freq: Every day | ORAL | Status: DC

## 2012-12-11 MED ORDER — ROSUVASTATIN CALCIUM 20 MG PO TABS: 20.0000 mg | ORAL_TABLET | Freq: Every day | ORAL | Status: DC

## 2012-12-11 NOTE — Patient Instructions (Addendum)
Lactose Intolerance, Adult  Lactose intolerance is when the body is not able to digest lactose, a sugar found in milk and milk products. Lactose intolerance is caused by your body not producing enough of the enzyme lactase. When there is not enough lactase to digest the amount of lactose consumed, discomfort may be felt. Lactose intolerance is not a milk allergy.  For most people, lactase deficiency is a condition that develops naturally over time. After about the age of 2, the body begins to produce less lactase. But many people may not experience symptoms until they are much older.  CAUSES  Things that can cause you to be lactose intolerant include:  · Aging.  · Being born without the ability to make lactase.  · Certain digestive diseases.  · Injuries to the small intestine.  SYMPTOMS   · Feeling sick to your stomach (nauseous).  · Diarrhea.  · Cramps.  · Bloating.  · Gas.  Symptoms usually show up a half hour or 2 hours after eating or drinking products containing lactose.  TREATMENT   No treatment can improve the body's ability to produce lactase. However, symptoms can be controlled through diet. A medicine may be given to you to take when you consume lactose-containing foods or drinks. The medicine contains the lactase enzyme, which help the body digest lactose better.  HOME CARE INSTRUCTIONS  · Eat or drink dairy products as told by your caregiver or dietician.  · Take all medicine as directed by your caregiver.  · Find lactose-free or lactose-reduced products at your local grocery store.  · Talk to your caregiver or dietician to decide if you need any dietary supplements.  The following is the amount of calcium needed from the diet:  · 19 to 50 years: 1000 mg  · Over 50 years: 1200 mg  Calcium and Lactose in Common Foods  Non-Dairy Products / Calcium Content (mg)  · Calcium-fortified orange juice, 1 cup / 308 to 344 mg  · Sardines, with edible bones, 3 oz / 270 mg  · Salmon, canned, with edible bones, 3 oz /  205 mg  · Soymilk, fortified, 1 cup / 200 mg  · Broccoli (raw), 1 cup / 90 mg  · Orange, 1 medium / 50 mg  · Pinto beans, ½ cup / 40 mg  · Tuna, canned, 3 oz / 10 mg  · Lettuce greens, ½ cup / 10 mg  Dairy Products / Calcium Content (mg) / Lactose Content (g)  · Yogurt, plain, low-fat, 1 cup / 415 mg / 5 g  · Milk, reduced fat, 1 cup / 295 mg / 11 g  · Swiss cheese, 1 oz / 270 mg / 1 g  · Ice cream, ½ cup / 85 mg / 6 g  · Cottage cheese, ½ cup / 75 mg / 2 to 3 g  SEEK MEDICAL CARE IF:  You have no relief from your symptoms.  Document Released: 06/27/2005 Document Revised: 09/19/2011 Document Reviewed: 09/24/2010  ExitCare® Patient Information ©2014 [employer].

## 2012-12-12 ENCOUNTER — Telehealth: Payer: Self-pay

## 2012-12-12 NOTE — Telephone Encounter (Signed)
Spoke with [employer] (pharmacist) Rx was cancelled.     KP

## 2012-12-12 NOTE — Telephone Encounter (Signed)
Tried calling but there was no answer.      KP

## 2012-12-12 NOTE — Telephone Encounter (Signed)
Message copied by Arnette Norris on Wed Dec 12, 2012  8:16 AM ------      Message from: Lelon Perla      Created: Tue Dec 11, 2012 11:05 AM       Please cancel antara and crestor with mail order ------

## 2012-12-13 ENCOUNTER — Encounter: Payer: Self-pay | Admitting: Family Medicine

## 2012-12-13 NOTE — Progress Notes (Signed)
  Subjective:     Dennis Moore is a 57 y.o. male who presents for evaluation of diarrhea. Onset of diarrhea was a few days ago. Diarrhea is occurring approximately 3 times per day. Patient describes diarrhea as watery. Diarrhea has been associated with abdominal pain described as cramping and suspicious food/drink dairy. Patient denies blood in stool, fever, illness in household contacts, recent antibiotic use, recent camping, recent travel, significant abdominal pain, unintentional weight loss. Previous visits for diarrhea: none. Evaluation to date: none.  Treatment to date: none.  The following portions of the patient's history were reviewed and updated as appropriate: allergies, current medications, past family history, past medical history, past social history, past surgical history and problem list.  Review of Systems Pertinent items are noted in HPI.    Objective:    BP 144/82  Pulse 66  Temp(Src) 98.6 F (37 C) (Oral)  Wt 196 lb (88.905 kg)  BMI 26.58 kg/m2  SpO2 97% General: alert, cooperative, appears stated age and no distress  Hydration:  well hydrated  Abdomen:    soft, non-tender; bowel sounds normal; no masses,  no organomegaly    Assessment:    Diarrhea of uncertain etiology, mild in severity   Plan:    Appropriate educational material discussed and distributed. Discussed the appropriate management of diarrhea. Follow up as needed. suspect lactose intolerance

## 2012-12-14 ENCOUNTER — Telehealth: Payer: Self-pay

## 2012-12-14 NOTE — Telephone Encounter (Signed)
Spoke with with patient, patient admits that may be he consumed too much alcohol. Patient states he will come in for repeat labs on Monday. Patient encouraged to not drink over the weekend. Per Dr.Lowne (was standing in front of me)-ok to hold off on U/S due to additional information provided by the patient.

## 2012-12-14 NOTE — Telephone Encounter (Signed)
Discussed with patient, patient states he was off Crestor x 1 month or longer. Patient recently restarted Crestor 2 days ago. Patient denies taking tylenol, vit A or other OTC medications that could increase liver function test. Patient questions if he should have additional labs repeated sooner than 2 weeks.  Dr.Lowne please advise

## 2012-12-14 NOTE — Telephone Encounter (Signed)
Message copied by Maurice Small on Fri Dec 14, 2012  9:28 AM ------      Message from: Lelon Perla      Created: Thu Dec 13, 2012 11:06 PM       Liver function elevated--- pt taking tylenol or other otc meds.?      Hold statin and repeat 2 weeks---hep, acute hep, ggt ------

## 2012-12-14 NOTE — Telephone Encounter (Signed)
Yes ----[place] abd  And do ggt and hep and acute hep this week if possible

## 2012-12-17 ENCOUNTER — Other Ambulatory Visit: Payer: Managed Care, Other (non HMO)

## 2012-12-21 ENCOUNTER — Other Ambulatory Visit: Payer: Self-pay | Admitting: Family Medicine

## 2012-12-21 ENCOUNTER — Other Ambulatory Visit (INDEPENDENT_AMBULATORY_CARE_PROVIDER_SITE_OTHER): Payer: Managed Care, Other (non HMO)

## 2012-12-21 DIAGNOSIS — R7989 Other specified abnormal findings of blood chemistry: Secondary | ICD-10-CM

## 2012-12-21 LAB — HEPATIC FUNCTION PANEL
ALT: 68 U/L — ABNORMAL HIGH (ref 0–53)
AST: 41 U/L — ABNORMAL HIGH (ref 0–37)
Albumin: 4 g/dL (ref 3.5–5.2)
Alkaline Phosphatase: 41 U/L (ref 39–117)
Bilirubin, Direct: 0 mg/dL (ref 0.0–0.3)
Total Protein: 7 g/dL (ref 6.0–8.3)

## 2012-12-22 ENCOUNTER — Other Ambulatory Visit: Payer: Self-pay | Admitting: Family Medicine

## 2012-12-24 ENCOUNTER — Telehealth: Payer: Self-pay | Admitting: Family Medicine

## 2012-12-24 NOTE — Telephone Encounter (Signed)
Patient states that he was being given samples of a medication for joint inflammation. He does not remember the name bu thinks it may have been called Cialis. Patient wants to know if we have more samples to provide.

## 2012-12-24 NOTE — Telephone Encounter (Signed)
Inform him Cialis is not for joint inflammation----we do not have samples of cialis It is for ED If he has the name of the med for inflammation we can send that in ---  It may have been celebrex or mobic?

## 2012-12-24 NOTE — Telephone Encounter (Signed)
Please advise      KP 

## 2012-12-25 NOTE — Telephone Encounter (Signed)
Pt states he needs CELEBREX. Pt uses [facility] on [address].

## 2012-12-25 NOTE — Telephone Encounter (Signed)
Pt is requesting samples. He states he cannot afford a prescription right now.

## 2012-12-25 NOTE — Telephone Encounter (Signed)
Sample request for Celebrex. Please advise    KP

## 2012-12-25 NOTE — Telephone Encounter (Signed)
He can have a few if we have them

## 2012-12-25 NOTE — Telephone Encounter (Signed)
Vm left to make the patient aware samples are ready for pick up.     KP

## 2012-12-26 ENCOUNTER — Telehealth: Payer: Self-pay

## 2012-12-26 NOTE — Telephone Encounter (Signed)
Error.     KP 

## 2013-01-01 ENCOUNTER — Telehealth: Payer: Self-pay | Admitting: Family Medicine

## 2013-01-01 NOTE — Telephone Encounter (Signed)
noted 

## 2013-01-01 NOTE — Telephone Encounter (Signed)
Patient states Selena Batten called him regarding an appt with a specialist. He would like to speak with you about this. He did not know what kind of specialist. [phone]

## 2013-01-01 NOTE — Telephone Encounter (Signed)
Patient has cancelled multiple referrals with specialists and has been told by the MD to call and schedule his appointment. I will forward to Manager for review     KP

## 2013-01-01 NOTE — Telephone Encounter (Signed)
Patient walked into office approx. 3:25 p.m. Today.  Multiple referrals have been made to urologist, and for 2nd opintion, which he has not kept.  He requested to meet with Luster Landsberg for a new referral.  Charity asked him to wait just a few minutes.  He then called the office, and asked to speak with [place], who was sitting at the registration desk. (Right in front of him.) This is an inappropriate use of our office resources.  Kim advised me of the situation, and advised the front office staff I was coming talk with him - per Dr. Laury Axon, give one more chance with referral, and if doesn't keep, will be dismissal due to noncompliance with healthcare plan.  When Selena Batten got to the front desk, he had already left (maybe 30 seconds had elapsed) stating Selena Batten was to call him with the referral; he didn't have time to wait.  In light of these events, noncompliance with referral appointments, and noncompliance with Dr. Ernst Spell healthcare plan, I am recommending dismissal.

## 2013-01-01 NOTE — Telephone Encounter (Signed)
Will forward to MD for review      KP 

## 2013-01-08 NOTE — Telephone Encounter (Signed)
Pt states no one has returned his call. He would like someone to call him at [phone].

## 2013-01-08 NOTE — Telephone Encounter (Signed)
msg left to cal the office    KP

## 2013-01-09 NOTE — Telephone Encounter (Signed)
Spoke with patient and he wanted to have his [place] scheduled at [facility] imaging tomorrow or this weekend. I have made him aware that he will need to call the Urologist or specialist for all of his referrals due to his schedule conflicts and he voiced understanding.       KP     Dr.Lowne the patient is also requesting samples of Levitra or CIalis. Please advise     KP

## 2013-01-09 NOTE — Telephone Encounter (Signed)
agree

## 2013-01-10 ENCOUNTER — Ambulatory Visit
Admission: RE | Admit: 2013-01-10 | Discharge: 2013-01-10 | Disposition: A | Discharge status: Home / Self Care | Payer: Managed Care, Other (non HMO) | Source: Ambulatory Visit | Attending: Family Medicine | Admitting: Family Medicine

## 2013-01-14 ENCOUNTER — Other Ambulatory Visit (INDEPENDENT_AMBULATORY_CARE_PROVIDER_SITE_OTHER): Payer: Managed Care, Other (non HMO)

## 2013-01-14 ENCOUNTER — Telehealth: Payer: Self-pay

## 2013-01-14 DIAGNOSIS — R7989 Other specified abnormal findings of blood chemistry: Secondary | ICD-10-CM

## 2013-01-14 LAB — HEPATIC FUNCTION PANEL
ALT: 20 U/L (ref 0–53)
AST: 17 U/L (ref 0–37)
Albumin: 4.4 g/dL (ref 3.5–5.2)

## 2013-01-14 NOTE — Telephone Encounter (Signed)
Yes-- repeat hep panal-- if increased -- will refer to GI

## 2013-01-14 NOTE — Telephone Encounter (Signed)
Message copied by Arnette Norris on Mon Jan 14, 2013 10:00 AM ------      Message from: Lelon Perla      Created: Thu Jan 10, 2013  5:18 PM       Normal [place] ------

## 2013-01-14 NOTE — Telephone Encounter (Signed)
Spoke with patient and made him aware his abdominal US was normal. He voiced understanding and wanted to know what you wanted to do next.? He had stopped drinking. His last GGT was 169 and AST/ALT were both elevated 3 weeks ago, would you like to repeat theses labs. Please advise     KP

## 2013-01-16 ENCOUNTER — Other Ambulatory Visit: Payer: Managed Care, Other (non HMO)

## 2013-01-16 ENCOUNTER — Encounter: Payer: Self-pay | Admitting: Family Medicine

## 2013-01-16 ENCOUNTER — Ambulatory Visit (INDEPENDENT_AMBULATORY_CARE_PROVIDER_SITE_OTHER): Payer: Managed Care, Other (non HMO) | Admitting: Family Medicine

## 2013-01-16 VITALS — BP 130/72 | HR 59 | Temp 98.3°F | Wt 196.0 lb

## 2013-01-16 DIAGNOSIS — H6120 Impacted cerumen, unspecified ear: Secondary | ICD-10-CM

## 2013-01-16 DIAGNOSIS — H6122 Impacted cerumen, left ear: Secondary | ICD-10-CM

## 2013-01-16 NOTE — Progress Notes (Signed)
  Subjective:    Patient ID: Dennis Moore, male    DOB: 03/06/1956, 57 y.o.   MRN: [id number]  HPI  Pt here c/o L ear feeling full.  No other complaints.  Review of Systems    as above Objective:   Physical Exam  BP 130/72  Pulse 59  Temp(Src) 98.3 F (36.8 C) (Oral)  Wt 196 lb (88.905 kg)  BMI 26.58 kg/m2  SpO2 97% General appearance: alert, cooperative, appears stated age and no distress Ears: L ear--+ cerumen impaction--hoop attempted ---irrigated successfully      Assessment & Plan:

## 2013-01-16 NOTE — Assessment & Plan Note (Signed)
Irrigated successfully  rto prn 

## 2013-01-16 NOTE — Patient Instructions (Signed)
Cerumen Impaction A cerumen impaction is when the wax in your ear forms a plug. This plug usually causes reduced hearing. Sometimes it also causes an earache or dizziness. Removing a cerumen impaction can be difficult and painful. The wax sticks to the ear canal. The canal is sensitive and bleeds easily. If you try to remove a heavy wax buildup with a cotton tipped swab, you may push it in further. Irrigation with water, suction, and small ear curettes may be used to clear out the wax. If the impaction is fixed to the skin in the ear canal, ear drops may be needed for a few days to loosen the wax. People who build up a lot of wax frequently can use ear wax removal products available in your local drugstore. SEEK MEDICAL CARE IF:  You develop an earache, increased hearing loss, or marked dizziness. Document Released: 08/04/2004 Document Revised: 09/19/2011 Document Reviewed: 09/24/2009 ExitCare Patient Information 2014 [employer].  

## 2013-01-18 ENCOUNTER — Encounter: Payer: Self-pay | Admitting: Family Medicine

## 2013-03-18 ENCOUNTER — Ambulatory Visit (INDEPENDENT_AMBULATORY_CARE_PROVIDER_SITE_OTHER): Payer: Managed Care, Other (non HMO) | Admitting: Family Medicine

## 2013-03-18 ENCOUNTER — Encounter: Payer: Self-pay | Admitting: Family Medicine

## 2013-03-18 VITALS — BP 148/80 | HR 65 | Temp 98.2°F | Wt 201.0 lb

## 2013-03-18 DIAGNOSIS — E785 Hyperlipidemia, unspecified: Secondary | ICD-10-CM

## 2013-03-18 DIAGNOSIS — F988 Other specified behavioral and emotional disorders with onset usually occurring in childhood and adolescence: Secondary | ICD-10-CM | POA: Insufficient documentation

## 2013-03-18 DIAGNOSIS — M1610 Unilateral primary osteoarthritis, unspecified hip: Secondary | ICD-10-CM

## 2013-03-18 MED ORDER — SILDENAFIL CITRATE 100 MG PO TABS: 50.0000 mg | ORAL_TABLET | Freq: Every day | ORAL | Status: DC | PRN

## 2013-03-18 MED ORDER — ATOMOXETINE HCL 80 MG PO CAPS: 80.0000 mg | ORAL_CAPSULE | Freq: Every day | ORAL | Status: DC

## 2013-03-18 MED ORDER — LISINOPRIL-HYDROCHLOROTHIAZIDE 20-12.5 MG PO TABS: ORAL_TABLET | ORAL | Status: DC

## 2013-03-18 MED ORDER — FENOFIBRATE MICRONIZED 90 MG PO CAPS: 1.0000 | ORAL_CAPSULE | Freq: Every day | ORAL | Status: DC

## 2013-03-18 MED ORDER — CELECOXIB 200 MG PO CAPS: 200.0000 mg | ORAL_CAPSULE | Freq: Two times a day (BID) | ORAL | Status: DC

## 2013-03-18 NOTE — Patient Instructions (Addendum)
Attention Deficit Hyperactivity Disorder Attention deficit hyperactivity disorder (ADHD) is a problem with behavior issues based on the way the brain functions (neurobehavioral disorder). It is a common reason for behavior and academic problems in school. CAUSES  The cause of ADHD is unknown in most cases. It may run in families. It sometimes can be associated with learning disabilities and other behavioral problems. SYMPTOMS  There are 3 types of ADHD. The 3 types and some of the symptoms include:  Inattentive  Gets bored or distracted easily.  Loses or forgets things. Forgets to hand in homework.  Has trouble organizing or completing tasks.  Difficulty staying on task.  An inability to organize daily tasks and school work.  Leaving projects, chores, or homework unfinished.  Trouble paying attention or responding to details. Careless mistakes.  Difficulty following directions. Often seems like is not listening.  Dislikes activities that require sustained attention (like chores or homework).  Hyperactive-impulsive  Feels like it is impossible to sit still or stay in a seat. Fidgeting with hands and feet.  Trouble waiting turn.  Talking too much or out of turn. Interruptive.  Speaks or acts impulsively.  Aggressive, disruptive behavior.  Constantly busy or on the go, noisy.  Combined  Has symptoms of both of the above. Often children with ADHD feel discouraged about themselves and with school. They often perform well below their abilities in school. These symptoms can cause problems in home, school, and in relationships with peers. As children get older, the excess motor activities can calm down, but the problems with paying attention and staying organized persist. Most children do not outgrow ADHD but with good treatment can learn to cope with the symptoms. DIAGNOSIS  When ADHD is suspected, the diagnosis should be made by professionals trained in ADHD.  Diagnosis will  include:  Ruling out other reasons for the child's behavior.  The caregivers will check with the child's school and check their medical records.  They will talk to teachers and parents.  Behavior rating scales for the child will be filled out by those dealing with the child on a daily basis. A diagnosis is made only after all information has been considered. TREATMENT  Treatment usually includes behavioral treatment often along with medicines. It may include stimulant medicines. The stimulant medicines decrease impulsivity and hyperactivity and increase attention. Other medicines used include antidepressants and certain blood pressure medicines. Most experts agree that treatment for ADHD should address all aspects of the child's functioning. Treatment should not be limited to the use of medicines alone. Treatment should include structured classroom management. The parents must receive education to address rewarding good behavior, discipline, and limit-setting. Tutoring or behavioral therapy or both should be available for the child. If untreated, the disorder can have long-term serious effects into adolescence and adulthood. HOME CARE INSTRUCTIONS   Often with ADHD there is a lot of frustration among the family in dealing with the illness. There is often blame and anger that is not warranted. This is a life long illness. There is no way to prevent ADHD. In many cases, because the problem affects the family as a whole, the entire family may need help. A therapist can help the family find better ways to handle the disruptive behaviors and promote change. If the child is young, most of the therapist's work is with the parents. Parents will learn techniques for coping with and improving their child's behavior. Sometimes only the child with the ADHD needs counseling. Your caregivers can help   you make these decisions.  Children with ADHD may need help in organizing. Some helpful tips include:  Keep  routines the same every day from wake-up time to bedtime. Schedule everything. This includes homework and playtime. This should include outdoor and indoor recreation. Keep the schedule on the refrigerator or a bulletin board where it is frequently seen. Mark schedule changes as far in advance as possible.  Have a place for everything and keep everything in its place. This includes clothing, backpacks, and school supplies.  Encourage writing down assignments and bringing home needed books.  Offer your child a well-balanced diet. Breakfast is especially important for school performance. Children should avoid drinks with caffeine including:  Soft drinks.  Coffee.  Tea.  However, some older children (adolescents) may find these drinks helpful in improving their attention.  Children with ADHD need consistent rules that they can understand and follow. If rules are followed, give small rewards. Children with ADHD often receive, and expect, criticism. Look for good behavior and praise it. Set realistic goals. Give clear instructions. Look for activities that can foster success and self-esteem. Make time for pleasant activities with your child. Give lots of affection.  Parents are their children's greatest advocates. Learn as much as possible about ADHD. This helps you become a stronger and better advocate for your child. It also helps you educate your child's teachers and instructors if they feel inadequate in these areas. Parent support groups are often helpful. A national group with local chapters is called CHADD (Children and Adults with Attention Deficit Hyperactivity Disorder). PROGNOSIS  There is no cure for ADHD. Children with the disorder seldom outgrow it. Many find adaptive ways to accommodate the ADHD as they mature. SEEK MEDICAL CARE IF:  Your child has repeated muscle twitches, cough or speech outbursts.  Your child has sleep problems.  Your child has a marked loss of  appetite.  Your child develops depression.  Your child has new or worsening behavioral problems.  Your child develops dizziness.  Your child has a racing heart.  Your child has stomach pains.  Your child develops headaches. Document Released: 06/17/2002 Document Revised: 09/19/2011 Document Reviewed: 01/28/2008 ExitCare Patient Information 2014 [employer].  

## 2013-03-18 NOTE — Progress Notes (Signed)
  Subjective:    Patient ID: Dennis Moore, male    DOB: 10/23/1955, 57 y.o.   MRN: [id number]  HPI Pt here to discuss ADD again.  He never took the [place] and we did not want to go with stimulant secondary to HTN.  And anxiety.  No other complaints   Review of Systems As above    Objective:   Physical Exam BP 148/80  Pulse 65  Temp(Src) 98.2 F (36.8 C) (Oral)  Wt 201 lb (91.173 kg)  BMI 27.25 kg/m2  SpO2 98% General appearance: alert, cooperative, appears stated age and no distress Eyes: conjunctivae/corneas clear. PERRL, EOM's intact. Fundi benign. Neck: no adenopathy, no carotid bruit, no JVD, supple, symmetrical, trachea midline and thyroid not enlarged, symmetric, no tenderness/mass/nodules Lungs: clear to auscultation bilaterally Heart: S1, S2 normal       Assessment & Plan:

## 2013-03-18 NOTE — Assessment & Plan Note (Signed)
straterra starter pack then rx 80 mg rto 3 months or sooner prn

## 2013-05-02 ENCOUNTER — Telehealth: Payer: Self-pay | Admitting: Family Medicine

## 2013-05-02 NOTE — Telephone Encounter (Signed)
Spoke with patient and he stated that he has been having some urine frequency and wanted to know if Cialis can be used for it, I advised the patient if he has BPH Cialis is indicated for use for the urine frequency. He would like to come in an discuss with Dr.Lowne, I asked the patient about Urology and he said they did not give him any answers. Apt scheduled for tomorrow.      KP

## 2013-05-02 NOTE — Telephone Encounter (Signed)
Patient called and wanted to speak to kim about a medication issue. Patient would not explain any further. thanks

## 2013-05-03 ENCOUNTER — Encounter: Payer: Self-pay | Admitting: Family Medicine

## 2013-05-03 ENCOUNTER — Ambulatory Visit (INDEPENDENT_AMBULATORY_CARE_PROVIDER_SITE_OTHER): Payer: Managed Care, Other (non HMO) | Admitting: Family Medicine

## 2013-05-03 VITALS — BP 120/72 | HR 57 | Temp 98.1°F | Wt 201.0 lb

## 2013-05-03 DIAGNOSIS — R350 Frequency of micturition: Secondary | ICD-10-CM

## 2013-05-03 LAB — POCT URINALYSIS DIPSTICK
Blood, UA: NEGATIVE
Leukocytes, UA: NEGATIVE
Nitrite, UA: NEGATIVE
Protein, UA: NEGATIVE
pH, UA: 6.5

## 2013-05-03 MED ORDER — TOLTERODINE TARTRATE ER 4 MG PO CP24: 4.0000 mg | ORAL_CAPSULE | Freq: Every day | ORAL | Status: DC

## 2013-05-03 NOTE — Patient Instructions (Signed)
Urinary Incontinence Your doctor wants you to have this information about urinary incontinence. This is the inability to keep urine in your body until you decide to release it. CAUSES  Prostate gland enlargement is a common cause of urinary incontinence. But there are many different causes for losing urinary control. They include:  Medicines.  Infections.  Prostate problems.  Surgery.  Neurological diseases.  Emotional factors. DIAGNOSIS  Evaluating the cause of incontinence is important in choosing the best treatment. This may require:  An ultrasound exam.  Kidney and bladder X-rays.  Cystoscopy. This is an exam of the bladder using a narrow scope. TREATMENT  For incontinent patients, normal daily hygiene and using changing pads or adult diapers regularly will prevent offensive odors and skin damage from the moisture. Changing your medicines may help control incontinence. Your caregiver may prescribe some medicines to help you regain control. Avoid caffeine. It can over-stimulate the bladder. Use the bathroom regularly. Try about every 2 to 3 hours even if you do not feel the need. Take time to empty your bladder completely. After urinating, wait a minute. Then try to urinate again. External devices used to catch urine or an indwelling urine catheter (Foley catheter) may be needed as well. Some prostate gland problems require surgery to correct. Call your caregiver for more information. Document Released: 08/04/2004 Document Revised: 09/19/2011 Document Reviewed: 07/30/2008 ExitCare Patient Information 2014 [employer].  

## 2013-05-04 NOTE — Assessment & Plan Note (Signed)
Normal prostate detrol la  rto prn  Pt prefers to not go back to urology

## 2013-05-04 NOTE — Progress Notes (Signed)
  Subjective:    Patient ID: Dennis Moore, male    DOB: Jan 08, 1956, 57 y.o.   MRN: [id number]  HPI  Pt here c/o urinary frequency that con't.  He has see urolog and was told his prostate was normal.  No other complaints.     Review of Systems As above    Objective:   Physical Exam BP 120/72  Pulse 57  Temp(Src) 98.1 F (36.7 C) (Oral)  Wt 201 lb (91.173 kg)  BMI 27.25 kg/m2  SpO2 97% General appearance: alert, cooperative, appears stated age and no distress        Assessment & Plan:

## 2013-05-16 ENCOUNTER — Other Ambulatory Visit: Payer: Self-pay

## 2013-06-05 ENCOUNTER — Ambulatory Visit (INDEPENDENT_AMBULATORY_CARE_PROVIDER_SITE_OTHER): Payer: Managed Care, Other (non HMO) | Admitting: Internal Medicine

## 2013-06-05 ENCOUNTER — Telehealth: Payer: Self-pay | Admitting: *Deleted

## 2013-06-05 ENCOUNTER — Encounter: Payer: Self-pay | Admitting: Internal Medicine

## 2013-06-05 VITALS — BP 143/87 | HR 61 | Temp 98.5°F | Wt 206.0 lb

## 2013-06-05 DIAGNOSIS — M549 Dorsalgia, unspecified: Secondary | ICD-10-CM

## 2013-06-05 MED ORDER — SILDENAFIL CITRATE 100 MG PO TABS: 50.0000 mg | ORAL_TABLET | Freq: Every day | ORAL | Status: DC | PRN

## 2013-06-05 NOTE — Progress Notes (Signed)
   Subjective:    Patient ID: Dennis Moore, male    DOB: 31-Mar-1956, 57 y.o.   MRN: [id number]  HPI Acute visit 4 weeks ago woke up with a pain in the left low back, pain was triggered by sitting or standing  and initially was very intense, it has gradually decreased and now is almost gone. No radiation. He does not recall any accident but he was sexually active before the pain started.  Past Medical History  Diagnosis Date  . Depression   . Hyperlipidemia   . Hypertension   . Erectile dysfunction   . Tubular adenoma of colon 11/2008  . Internal hemorrhoids   . BPH (benign prostatic hypertrophy)   . Allergic rhinitis    Past Surgical History  Procedure Laterality Date  . Appendectomy    . Elbow surgery      right  . Carpal tunnel release      right  . Inguinal hernia repair      age 47     Review of Systems Denies any rash at the abdomen back or flank, no lower extremity paresthesias, no bladder or bowel incontinence.    Objective:   Physical Exam  Musculoskeletal:       Back:   BP 143/87  Pulse 61  Temp(Src) 98.5 F (36.9 C)  Wt 206 lb (93.441 kg)  SpO2 98% General -- alert, well-developed, NAD.  Back-- Inspection - palpation of the back w/o  rash, swelling, + mild tenderness (see graphic)  Extremities-- no pretibial edema bilaterally  Neurologic--  alert & oriented X3. Speech normal, gait normal, strength normal in all extremities.  DTRs symmetric , Straight leg test negative Psych-- Cognition and judgment appear intact. Cooperative with normal attention span and concentration. No anxious appearing , no depressed appearing.      Assessment & Plan:  Back pain, Essentially resolved, no evidence of radiculopathy or shingles. Sprain? Recommend observation, see instructions.

## 2013-06-05 NOTE — Progress Notes (Signed)
Pre visit review using our clinic review tool, if applicable. No additional management support is needed unless otherwise documented below in the visit note. 

## 2013-06-05 NOTE — Telephone Encounter (Signed)
06/05/2013  Received Patient Assistance Program Application / Novo Nordisk to be completed by Dr. Laury Axon.  Attached billing form, put in Sandra's folder.  bw

## 2013-06-05 NOTE — Patient Instructions (Signed)
Take celebrex as needed Call if the pain is not gone within 2-3 weeks or if it resurface

## 2013-06-10 ENCOUNTER — Telehealth: Payer: Self-pay | Admitting: *Deleted

## 2013-06-10 NOTE — Telephone Encounter (Signed)
After reviewing the patient assistance program application, I feel we need more information in order to complete this. The patient is asking this paperwork be completed from a company that typically supplies diabetes medications. I do not see where this patient is taking any medications to treat diabetes. Left message on voice mail to return my call.

## 2013-06-10 NOTE — Telephone Encounter (Signed)
Message sent to pt via My Chart to clarify which medication he needs assistance with.

## 2013-07-03 ENCOUNTER — Ambulatory Visit (INDEPENDENT_AMBULATORY_CARE_PROVIDER_SITE_OTHER): Payer: Managed Care, Other (non HMO) | Admitting: Internal Medicine

## 2013-07-03 ENCOUNTER — Encounter: Payer: Self-pay | Admitting: Internal Medicine

## 2013-07-03 VITALS — BP 113/70 | HR 73 | Temp 98.0°F | Wt 215.0 lb

## 2013-07-03 DIAGNOSIS — F528 Other sexual dysfunction not due to a substance or known physiological condition: Secondary | ICD-10-CM

## 2013-07-03 DIAGNOSIS — M1610 Unilateral primary osteoarthritis, unspecified hip: Secondary | ICD-10-CM

## 2013-07-03 DIAGNOSIS — M25551 Pain in right hip: Secondary | ICD-10-CM

## 2013-07-03 MED ORDER — CELECOXIB 200 MG PO CAPS: 200.0000 mg | ORAL_CAPSULE | Freq: Every day | ORAL | Status: DC | PRN

## 2013-07-03 MED ORDER — HYDROCODONE-ACETAMINOPHEN 5-325 MG PO TABS: 1.0000 | ORAL_TABLET | Freq: Three times a day (TID) | ORAL | Status: DC | PRN

## 2013-07-03 NOTE — Patient Instructions (Signed)
Take Celebrex sporadically for pain Tylenol  500 mg OTC 2 tabs a day every 8 hours as needed , if the pain is severe, take instead hydrocodone (it has tylenol on it already), will cause drossiness

## 2013-07-03 NOTE — Progress Notes (Signed)
   Subjective:    Patient ID: Dennis Moore, male    DOB: September 28, 1955, 57 y.o.   MRN: [id number]  HPI Acute visit 3 years history of hip pain, bilateral, on the lateral aspect of the hip. He previously saw orthopedic surgery, see assessment and plan. Pain is getting worse in the last 2 or 3 weeks particularly at night, has been unable to sleep.  Past Medical History  Diagnosis Date  . Depression   . Hyperlipidemia   . Hypertension   . Erectile dysfunction   . Tubular adenoma of colon 11/2008  . Internal hemorrhoids   . BPH (benign prostatic hypertrophy)   . Allergic rhinitis    Past Surgical History  Procedure Laterality Date  . Appendectomy    . Elbow surgery      right  . Carpal tunnel release      right  . Inguinal hernia repair      age 29     Review of Systems Denies any fever or chills. No recent injury.    Objective:   Physical Exam  BP 113/70  Pulse 73  Temp(Src) 98 F (36.7 C)  Wt 215 lb (97.523 kg)  SpO2 95% General -- alert, well-developed, NAD.   Extremities-- no pretibial edema bilaterally ; Range of motion of the hips normal, not tender at the trochanteric bursa on either side. Gait not antalgic. Neurologic--  alert & oriented X3. Speech normal,   strength normal in all extremities.  Psych-- Cognition and judgment appear intact. Cooperative with normal attention span and concentration. No anxious appearing , no depressed appearing.      Assessment & Plan:   Hip pain, pain for years, worse in the last few weeks. He saw orthopedic surgery, diagnosed with DJD and told that eventually he will need some sort of surgery. I recommend to go back to Celebrex 200 mg once a day. He requests something for immediate relief, will do hydrocodone to be taken as needed, watch for somnolence. Will also  refer to Dr. Lequita Halt  ED, request samples of Viagra which were provided

## 2013-07-03 NOTE — Progress Notes (Signed)
Pre visit review using our clinic review tool, if applicable. No additional management support is needed unless otherwise documented below in the visit note. 

## 2013-07-05 ENCOUNTER — Encounter: Payer: Self-pay | Admitting: Internal Medicine

## 2013-07-25 ENCOUNTER — Telehealth: Payer: Self-pay | Admitting: *Deleted

## 2013-07-25 NOTE — Telephone Encounter (Signed)
Patient aware sample ready for pick up.     KP

## 2013-07-25 NOTE — Telephone Encounter (Signed)
viagra put in bag on your desk

## 2013-07-25 NOTE — Telephone Encounter (Signed)
Please advise      KP 

## 2013-07-25 NOTE — Telephone Encounter (Signed)
07/25/2013 Pt called requesting samples of Viagra or Cialis.  Please advise.  bw

## 2013-07-28 IMAGING — CT CT HEART MORP W/ CTA COR W/ SCORE W/ CA W/CM &/OR W/O CM
1 of 5 series · 11 of 20 positions shown, 14 images · IV contrast (CONTRAST)
Comparison: None.

INDICATION: Chest pain.

CT ANGIOGRAPHY OF THE HEART, CORONARY ARTERY, STRUCTURE, AND
MORPHOLOGY
CONTRAST:  80 ml Omnipaque 350 IV
TECHNIQUE: CT angiography of the coronary vessels was performed on
a 256 channel system using prospective ECG gating.  A scout and
noncontrast exam (for calcium scoring) were performed.  Circulation
time was measured using a test bolus.  Coronary CTA was performed
with sub mm slice collimation during portions of the cardiac cycle
after prior injection of iodinated contrast.  Imaging post
processing was performed on an independent workstation creating
multiplanar and 3-D images, and quantitative analysis of the heart
and coronary arteries.  Note that this exam targets the heart and
the chest was not imaged in its entirety.
PREMEDICATION:
Lopressor 50 mg, P.O.
Lopressor  mg, IV
Nitroglycerin 0.4 mcg, sublingual.

[Series 8: w/ edge cor., 78.0% · axial · 0.49mm/px · z∈[-123,-19]mm · 11 of 276 slices shown, 14 images]
[im 23/276  vessel]
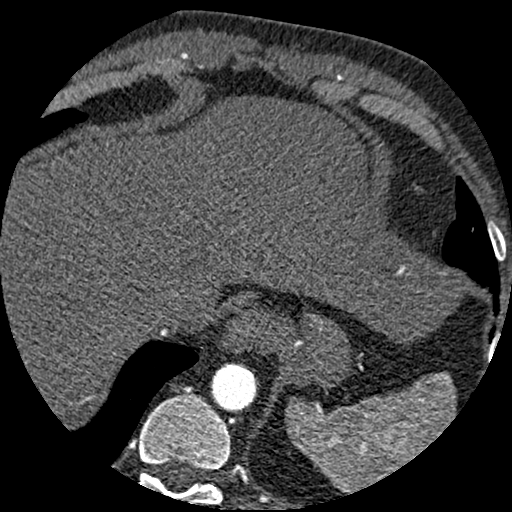
[im 23/276  lung]
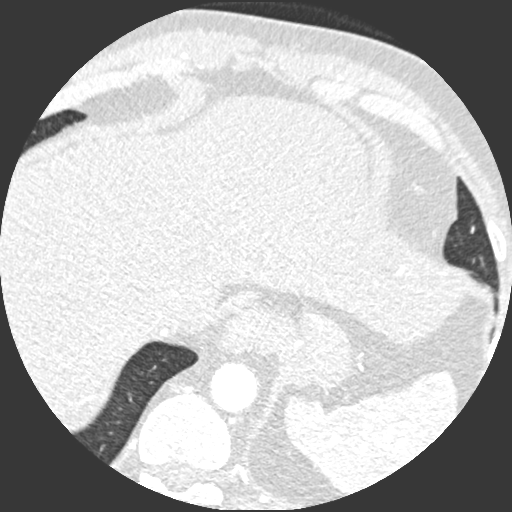
[im 46/276  vessel]
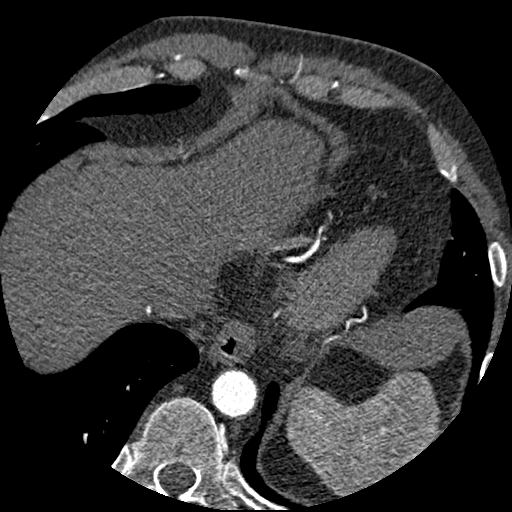
[im 69/276  vessel]
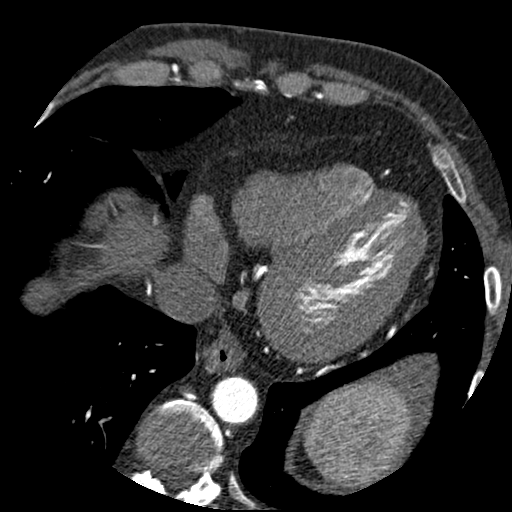
[im 92/276  vessel]
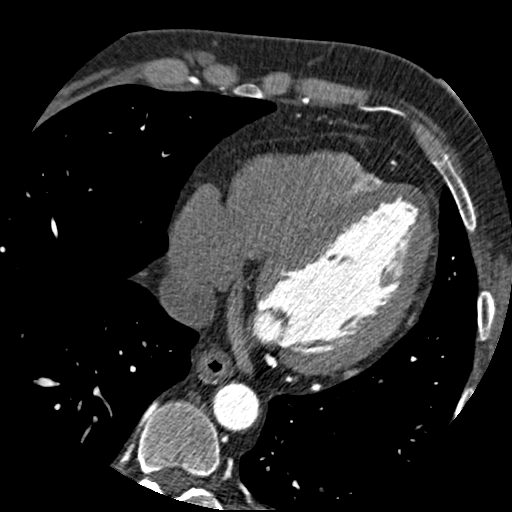
[im 115/276  vessel]
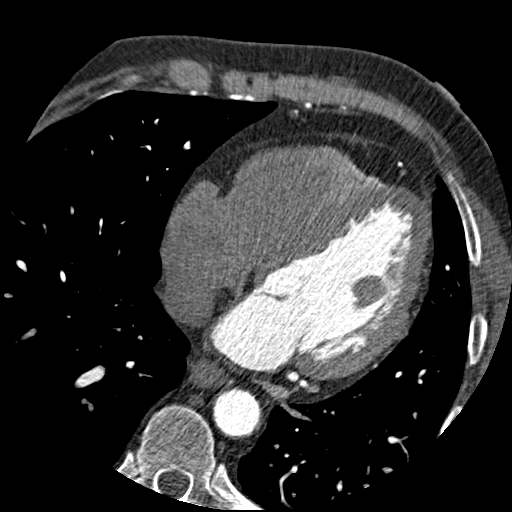
[im 115/276  lung]
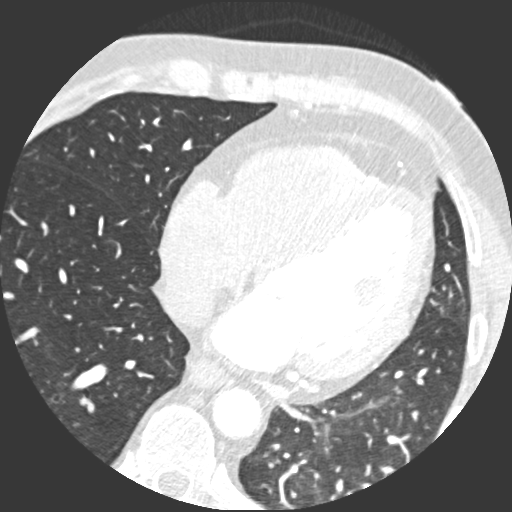
[im 138/276  vessel]
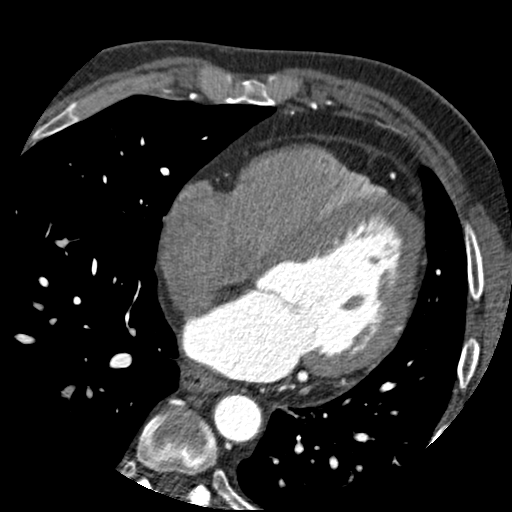
[im 161/276  vessel]
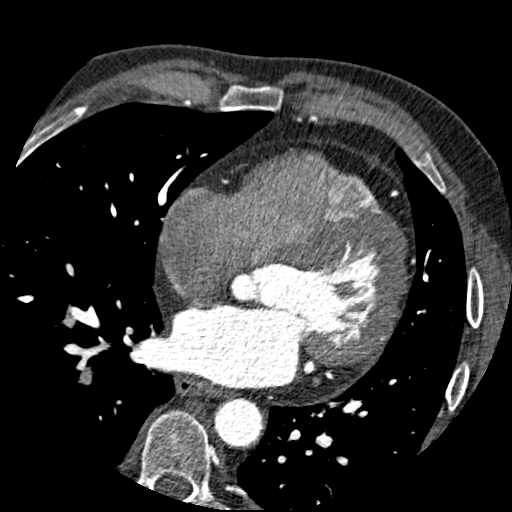
[im 184/276  vessel]
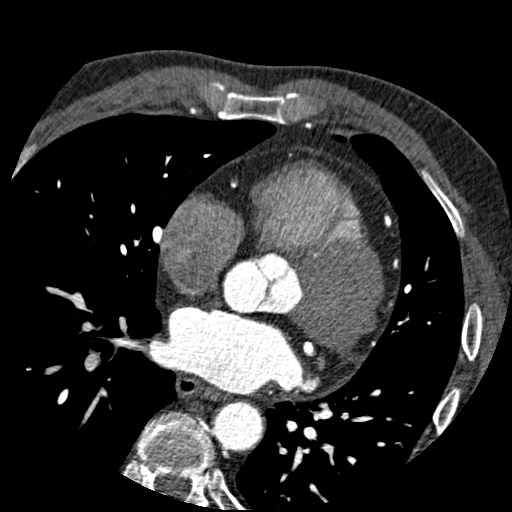
[im 207/276  vessel]
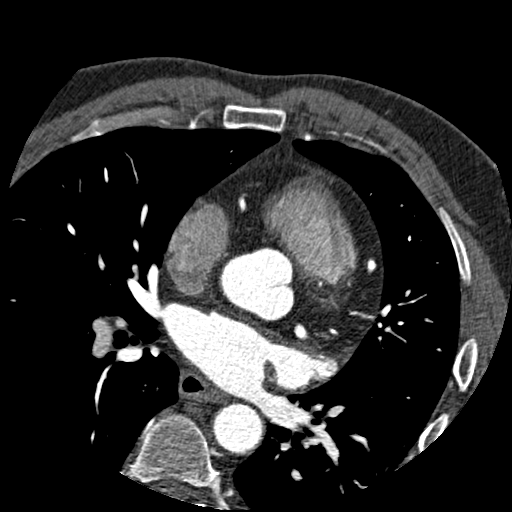
[im 207/276  lung]
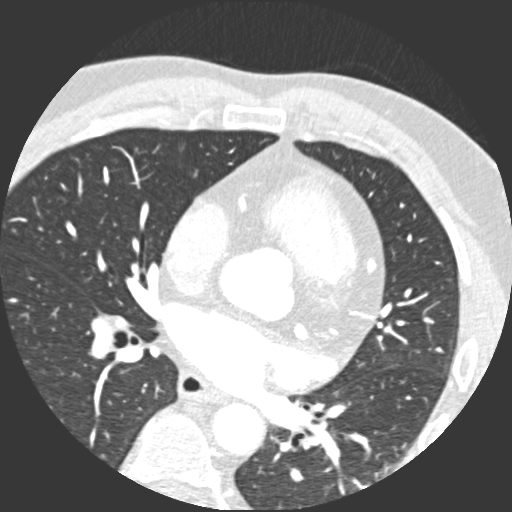
[im 230/276  vessel]
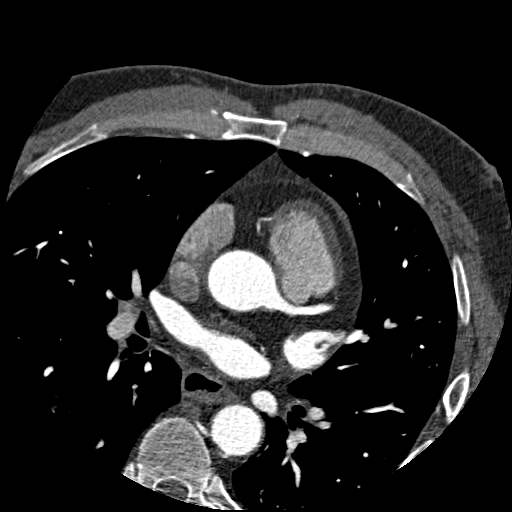
[im 253/276  vessel]
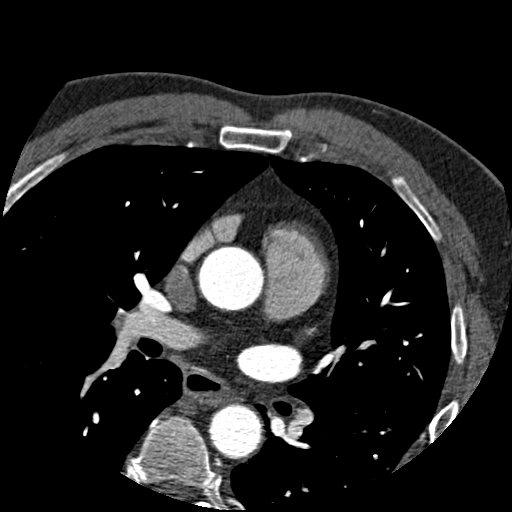

[11 of 20 positions shown; findings below may reference images not displayed]

FINDINGS: Technical quality:  Good.

Heart rate:  46

CORONARY ARTERIES:
Left main coronary artery:  Large caliber vessel of average length.
No internal disease, plaque or stenosis.  Trifurcates into the left
anterior descending artery, ramus intermedius, and left circumflex
artery.
Left anterior descending:  Large caliber vessel.  Gives rise to
several septal perforators and to medium sized diagonal branches.
The vessel was mildly tortuous, widely patent without any intrinsic
disease or plaque.
Left circumflex:  Vessel of large caliber, gives rise to an a
moderate sized first obtuse marginal branch and a large second
obtuse marginal branch.  Terminates into the posterior lateral left
ventricular branch and posterior descending artery.  No intrinsic
disease, plaque or stenosis.
Right coronary artery:  Small caliber vessel.  Gives rise to the
conus and Kwame Opoku Cashes branches.  Gives rise to moderate sized acute
marginal.  No intrinsic disease, plaque or significant stenosis.
Posterior descending artery:  Large vessel, arising from the left
circumflex artery.  No intrinsic disease or stenosis.
Dominance:  Left.

CORONARY CALCIUM:
Total Agatston Score:  Zero

[HOSPITAL] percentile:  Zero

CARDIAC MEASUREMENTS:
Interventricular septum (6 - 12 mm):  17 mm.
LV posterior wall (6 - 12 mm):  11 mm.
LV diameter in diastole (35 - 52 mm):  40 mm.

AORTA AND PULMONARY MEASUREMENTS:
Aortic root (21 - 40 mm):
            26 mm  at the annulus
            37 mm  at the sinuses of Valsalva
            24 mm  at the sinotubular junction
Ascending aorta ( <  40 mm):  31 mm.
Descending aorta ( <  40 mm):  25 mm.
Main pulmonary artery:  ( <  30 mm):  26 mm.

EXTRACARDIAC FINDINGS:
No adenopathy in the visualized lower mediastinum or hila.  Small
grade III that small patent foramina valve, likely not of clinical
significance.  Minimal ground-glass densities in the left lung
base, likely atelectasis.  No pleural effusions.  No acute bony
abnormality.  No acute findings in the upper abdomen.
IMPRESSION: 1.  Negative for coronary artery disease.  The patient's total
coronary artery calcium score is zero, which is zero percentile for
patient's matched age and gender.
2.  No significant extracardiac abnormality.
3.  Left coronary artery dominance.

Report was called to  [REDACTED] RN, Bungaro at 10 o'clock a.m. on
07/04/2011.

## 2013-09-09 ENCOUNTER — Encounter: Payer: Self-pay | Admitting: Family Medicine

## 2013-09-13 ENCOUNTER — Telehealth: Payer: Self-pay | Admitting: *Deleted

## 2013-09-13 ENCOUNTER — Other Ambulatory Visit: Payer: Self-pay | Admitting: *Deleted

## 2013-09-13 DIAGNOSIS — E785 Hyperlipidemia, unspecified: Secondary | ICD-10-CM

## 2013-09-13 MED ORDER — FENOFIBRATE MICRONIZED 90 MG PO CAPS: 1.0000 | ORAL_CAPSULE | Freq: Every day | ORAL | Status: DC

## 2013-09-13 MED ORDER — ROSUVASTATIN CALCIUM 20 MG PO TABS: 20.0000 mg | ORAL_TABLET | Freq: Every day | ORAL | Status: DC

## 2013-09-13 MED ORDER — SILDENAFIL CITRATE 100 MG PO TABS: 50.0000 mg | ORAL_TABLET | Freq: Every day | ORAL | Status: DC | PRN

## 2013-09-13 NOTE — Telephone Encounter (Signed)
Patient walked in the office today to get refills on Crestor, Fenofibrate and Viagra. Samples for Viagra was given and all scripts e-scribed to [facility] on [address]. JG//CMA

## 2013-10-10 ENCOUNTER — Telehealth: Payer: Self-pay

## 2013-10-10 NOTE — Telephone Encounter (Signed)
In order to deplete samples in the closet samples of Crestor, Antara and Viagra given to the patient.  Logged into the drug log book    KP

## 2013-10-31 ENCOUNTER — Telehealth: Payer: Self-pay | Admitting: Family Medicine

## 2013-10-31 NOTE — Telephone Encounter (Signed)
Patient called to have his med list printed and placed upfront for him.

## 2014-02-10 ENCOUNTER — Encounter (HOSPITAL_BASED_OUTPATIENT_CLINIC_OR_DEPARTMENT_OTHER): Payer: Self-pay | Admitting: Emergency Medicine

## 2014-02-10 ENCOUNTER — Emergency Department (HOSPITAL_BASED_OUTPATIENT_CLINIC_OR_DEPARTMENT_OTHER): Payer: Medicare Other

## 2014-02-10 ENCOUNTER — Emergency Department (HOSPITAL_BASED_OUTPATIENT_CLINIC_OR_DEPARTMENT_OTHER)
Admission: EM | Admit: 2014-02-10 | Discharge: 2014-02-10 | Disposition: A | Discharge status: Home / Self Care | Payer: Medicare Other | Attending: Emergency Medicine | Admitting: Emergency Medicine

## 2014-02-10 DIAGNOSIS — E785 Hyperlipidemia, unspecified: Secondary | ICD-10-CM | POA: Diagnosis not present

## 2014-02-10 DIAGNOSIS — E7800 Pure hypercholesterolemia, unspecified: Secondary | ICD-10-CM | POA: Insufficient documentation

## 2014-02-10 DIAGNOSIS — I10 Essential (primary) hypertension: Secondary | ICD-10-CM | POA: Insufficient documentation

## 2014-02-10 DIAGNOSIS — Z860100 Personal history of colon polyps, unspecified: Secondary | ICD-10-CM | POA: Insufficient documentation

## 2014-02-10 DIAGNOSIS — Z79899 Other long term (current) drug therapy: Secondary | ICD-10-CM | POA: Diagnosis not present

## 2014-02-10 DIAGNOSIS — R072 Precordial pain: Secondary | ICD-10-CM | POA: Diagnosis not present

## 2014-02-10 DIAGNOSIS — R079 Chest pain, unspecified: Secondary | ICD-10-CM | POA: Insufficient documentation

## 2014-02-10 DIAGNOSIS — R0789 Other chest pain: Secondary | ICD-10-CM

## 2014-02-10 DIAGNOSIS — Z880 Allergy status to penicillin: Secondary | ICD-10-CM | POA: Diagnosis not present

## 2014-02-10 DIAGNOSIS — F3289 Other specified depressive episodes: Secondary | ICD-10-CM | POA: Diagnosis not present

## 2014-02-10 DIAGNOSIS — F329 Major depressive disorder, single episode, unspecified: Secondary | ICD-10-CM | POA: Diagnosis not present

## 2014-02-10 DIAGNOSIS — N400 Enlarged prostate without lower urinary tract symptoms: Secondary | ICD-10-CM | POA: Diagnosis not present

## 2014-02-10 LAB — CBC WITH DIFFERENTIAL/PLATELET
BASOS ABS: 0.1 10*3/uL (ref 0.0–0.1)
Basophils Relative: 1 % (ref 0–1)
EOS ABS: 0.2 10*3/uL (ref 0.0–0.7)
Eosinophils Relative: 2 % (ref 0–5)
HCT: 37.6 % — ABNORMAL LOW (ref 39.0–52.0)
HEMOGLOBIN: 12.9 g/dL — AB (ref 13.0–17.0)
Lymphocytes Relative: 40 % (ref 12–46)
Lymphs Abs: 3.6 10*3/uL (ref 0.7–4.0)
MCH: 30.5 pg (ref 26.0–34.0)
MCHC: 34.3 g/dL (ref 30.0–36.0)
MCV: 88.9 fL (ref 78.0–100.0)
MONOS PCT: 11 % (ref 3–12)
Monocytes Absolute: 0.9 10*3/uL (ref 0.1–1.0)
Neutro Abs: 4.1 10*3/uL (ref 1.7–7.7)
Neutrophils Relative %: 46 % (ref 43–77)
Platelets: 292 10*3/uL (ref 150–400)
RBC: 4.23 MIL/uL (ref 4.22–5.81)
RDW: 15.5 % (ref 11.5–15.5)
WBC: 8.8 10*3/uL (ref 4.0–10.5)

## 2014-02-10 LAB — TROPONIN I: Troponin I: <0.3 ng/mL (ref <0.30)

## 2014-02-10 LAB — BASIC METABOLIC PANEL
ANION GAP: 15 (ref 5–15)
BUN: 20 mg/dL (ref 6–23)
CALCIUM: 9.9 mg/dL (ref 8.4–10.5)
CO2: 26 mEq/L (ref 19–32)
CREATININE: 1.6 mg/dL — AB (ref 0.50–1.35)
Chloride: 99 mEq/L (ref 96–112)
GFR calc non Af Amer: 46 mL/min — ABNORMAL LOW (ref >90)
GFR, EST AFRICAN AMERICAN: 53 mL/min — AB (ref >90)
Glucose, Bld: 104 mg/dL — ABNORMAL HIGH (ref 70–99)
Potassium: 3.8 mEq/L (ref 3.7–5.3)
Sodium: 140 mEq/L (ref 137–147)

## 2014-02-10 MED ORDER — DIAZEPAM 5 MG PO TABS: 5.0000 mg | ORAL_TABLET | Freq: Three times a day (TID) | ORAL | Status: DC | PRN

## 2014-02-10 MED ORDER — IBUPROFEN 800 MG PO TABS: 800.0000 mg | ORAL_TABLET | Freq: Three times a day (TID) | ORAL | Status: DC | PRN

## 2014-02-10 MED ORDER — ASPIRIN 81 MG PO CHEW
324.0000 mg | CHEWABLE_TABLET | Freq: Once | ORAL | Status: AC
  Administered 2014-02-10: 324 mg via ORAL
  Filled 2014-02-10: qty 4

## 2014-02-10 MED ORDER — NITROGLYCERIN 0.4 MG SL SUBL: 0.4000 mg | SUBLINGUAL_TABLET | SUBLINGUAL | Status: DC | PRN

## 2014-02-10 NOTE — ED Notes (Signed)
Pt reports pain to central chest and bilateral ribs, worse with inspiration, pain is constant, denies n/v diaphoresis or sob

## 2014-02-10 NOTE — ED Provider Notes (Signed)
This chart was scribed for [place], DO by Jeanell Sparrow, ED Scribe. This patient was seen in room MH06/MH06 and the patient's care was started at 7:34 PM.  TIME SEEN: 7:34 PM  CHIEF COMPLAINT: Chest Pain  HPI:  Dennis Moore is a 58 y.o. male with a hx of HTN and hypercholesterolemia who presents to the Emergency Department complaining of moderate constant chest pain that started two days ago. He reports that the pain is in his lower chest and ribs. He states that he really notices the pain when he is standing up or breathing deeply. He describes the pain as a tightness feeling. He has a history of prior tobacco use. He states his last assessment years ago. No history of cardiac catheterization. A history of PE or DVT. He also denies any SOB, nausea, emesis, diarrhea, fever, cough, diaphoresis, or dizziness.  PCP- Dr. Etter Sjogren  ROS: See HPI Constitutional: no fever, no diaphoresis Eyes: no drainage  ENT: no runny nose   Cardiovascular: chest pain  Resp: no SOB, no cough GI: no vomiting GU: no dysuria Integumentary: no rash  Allergy: no hives  Musculoskeletal: no leg swelling  Neurological: no slurred speech, no dizziness ROS otherwise negative  PAST MEDICAL HISTORY/PAST SURGICAL HISTORY:  Past Medical History  Diagnosis Date  . Depression   . Hyperlipidemia   . Hypertension   . Erectile dysfunction   . Tubular adenoma of colon 11/2008  . Internal hemorrhoids   . BPH (benign prostatic hypertrophy)   . Allergic rhinitis     MEDICATIONS:  Prior to Admission medications   Medication Sig Start Date End Date Taking? Authorizing Provider  albuterol (PROVENTIL HFA;VENTOLIN HFA) 108 (90 BASE) MCG/ACT inhaler Inhale 2 puffs into the lungs every 6 (six) hours as needed for wheezing. 10/11/12   Rosalita Chessman, DO  atomoxetine (STRATTERA) 80 MG capsule Take 1 capsule (80 mg total) by mouth daily. 03/18/13   Rosalita Chessman, DO  celecoxib (CELEBREX) 200 MG capsule Take 1 capsule (200 mg  total) by mouth daily as needed. 07/03/13   Colon Branch, MD  Fenofibrate Micronized (ANTARA) 90 MG CAPS Take 1 capsule by mouth daily. 09/13/13   Rosalita Chessman, DO  HYDROcodone-acetaminophen (NORCO/VICODIN) 5-325 MG per tablet Take 1-2 tablets by mouth every 8 (eight) hours as needed. 07/03/13   Colon Branch, MD  lisinopril-hydrochlorothiazide (ZESTORETIC) 20-12.5 MG per tablet 2 po qd 03/18/13   Rosalita Chessman, DO  rosuvastatin (CRESTOR) 20 MG tablet Take 1 tablet (20 mg total) by mouth daily. 09/13/13 09/13/14  Rosalita Chessman, DO  sildenafil (VIAGRA) 100 MG tablet Take 0.5-1 tablets (50-100 mg total) by mouth daily as needed for erectile dysfunction. 09/13/13   Rosalita Chessman, DO  tolterodine (DETROL LA) 4 MG 24 hr capsule Take 1 capsule (4 mg total) by mouth daily. 05/03/13   Rosalita Chessman, DO    ALLERGIES:  Allergies  Allergen Reactions  . Penicillins Swelling and Rash    SOCIAL HISTORY:  History  Substance Use Topics  . Smoking status: Never Smoker   . Smokeless tobacco: Never Used  . Alcohol Use: Yes    FAMILY HISTORY: Family History  Problem Relation Age of Onset  . Alzheimer's disease Paternal Uncle   . Parkinsonism Father   . Hyperlipidemia Father   . Hypertension Father   . Colon cancer Maternal Uncle   . Heart disease Father   . Heart disease Mother     EXAM: BP  147/77  Pulse 64  Temp(Src) 98.1 F (36.7 C) (Oral)  Resp 20  Ht 6' (1.829 m)  Wt 210 lb (95.255 kg)  BMI 28.47 kg/m2  SpO2 99% CONSTITUTIONAL: Alert and oriented and responds appropriately to questions. Well-appearing; well-nourished HEAD: Normocephalic EYES: Conjunctivae clear, PERRL ENT: normal nose; no rhinorrhea; moist mucous membranes; pharynx without lesions noted NECK: Supple, no meningismus, no LAD  CARD: RRR; S1 and S2 appreciated; no murmurs, no clicks, no rubs, no gallops RESP: Normal chest excursion without splinting or tachypnea; breath sounds clear and equal bilaterally; no wheezes, no  rhonchi, no rales, chest wall diffusely tender to palpation diffusely across the lower chest and lateral ribs ABD/GI: Normal bowel sounds; non-distended; soft, non-tender, no rebound, no guarding BACK:  The back appears normal and is non-tender to palpation, there is no CVA tenderness EXT: Normal ROM in all joints; non-tender to palpation; no edema; normal capillary refill; no cyanosis    SKIN: Normal color for age and race; warm NEURO: Moves all extremities equally PSYCH: The patient's mood and manner are appropriate. Grooming and personal hygiene are appropriate.  MEDICAL DECISION MAKING: Patient here with chest tightness that is worse with deep inspiration. Pain is been constant for 2 days. He does have multiple risk factors for ACS but pain appears to be atypical. EKG shows incomplete right bundle branch block that is new compared to prior no other new ischemic changes. We'll obtain cardiac labs, chest x-ray. We'll give aspirin, nitroglycerin and reassess.  ED PROGRESS: Patient's labs are unremarkable. Troponin negative. Chest x-ray clear. Discussed with patient that I suspect that if this was ACS, I would suspect see changes with his troponin by now. He states he has a PCP for close followup. He states that this feels like a muscle spasm on his signs. We'll discharge with prescription for ibuprofen and Valium. He has no risk factors for pulmonary embolus other than age. He is not hypoxic or tachycardic. Denies recent prolonged immobilization, fracture, surgery, trauma, lower extreme swelling or pain, prior PE or DVT. Patient reports he would rather be discharged home with close outpatient followup for admission. I feel this is a reasonable plan given refill his pain is atypical, reproducible with palpation of his chest wall. Discussed strict return precautions and supportive care instructions. Patient wife at bedside verbalize understanding and are comfortable with plan.     Date: 02/10/2014  18:58  Rate: 67  Rhythm: normal sinus rhythm  QRS Axis: Incomplete right bundle branch block  Intervals: normal  ST/T Wave abnormalities: normal  Conduction Disutrbances: none  Narrative Interpretation: Left atrial enlargement, incomplete right bundle branch block        [place], DO 02/10/14 2042

## 2014-02-10 NOTE — ED Notes (Signed)
Pt returned from xray. Alert and oriented, denies pain at this time

## 2014-02-10 NOTE — ED Notes (Signed)
Patient transported to X-ray 

## 2014-02-10 NOTE — Discharge Instructions (Signed)
Chest Wall Pain °Chest wall pain is pain in or around the bones and muscles of your chest. It may take up to 6 weeks to get better. It may take longer if you must stay physically active in your work and activities.  °CAUSES  °Chest wall pain may happen on its own. However, it may be caused by: °· A viral illness like the flu. °· Injury. °· Coughing. °· Exercise. °· Arthritis. °· Fibromyalgia. °· Shingles. °HOME CARE INSTRUCTIONS  °· Avoid overtiring physical activity. Try not to strain or perform activities that cause pain. This includes any activities using your chest or your abdominal and side muscles, especially if heavy weights are used. °· Put ice on the sore area. °¨ Put ice in a plastic bag. °¨ Place a towel between your skin and the bag. °¨ Leave the ice on for 15-20 minutes per hour while awake for the first 2 days. °· Only take over-the-counter or prescription medicines for pain, discomfort, or fever as directed by your caregiver. °SEEK IMMEDIATE MEDICAL CARE IF:  °· Your pain increases, or you are very uncomfortable. °· You have a fever. °· Your chest pain becomes worse. °· You have new, unexplained symptoms. °· You have nausea or vomiting. °· You feel sweaty or lightheaded. °· You have a cough with phlegm (sputum), or you cough up blood. °MAKE SURE YOU:  °· Understand these instructions. °· Will watch your condition. °· Will get help right away if you are not doing well or get worse. °Document Released: 06/27/2005 Document Revised: 09/19/2011 Document Reviewed: 02/21/2011 °ExitCare® Patient Information ©2015 [employer]. This information is not intended to replace advice given to you by your health care provider. Make sure you discuss any questions you have with your health care provider. ° °Chest Pain (Nonspecific) °It is often hard to give a specific diagnosis for the cause of chest pain. There is always a chance that your pain could be related to something serious, such as a heart attack or a blood  clot in the lungs. You need to follow up with your health care provider for further evaluation. °CAUSES  °· Heartburn. °· Pneumonia or bronchitis. °· Anxiety or stress. °· Inflammation around your heart (pericarditis) or lung (pleuritis or pleurisy). °· A blood clot in the lung. °· A collapsed lung (pneumothorax). It can develop suddenly on its own (spontaneous pneumothorax) or from trauma to the chest. °· Shingles infection (herpes zoster virus). °The chest wall is composed of bones, muscles, and cartilage. Any of these can be the source of the pain. °· The bones can be bruised by injury. °· The muscles or cartilage can be strained by coughing or overwork. °· The cartilage can be affected by inflammation and become sore (costochondritis). °DIAGNOSIS  °Lab tests or other studies may be needed to find the cause of your pain. Your health care provider may have you take a test called an ambulatory electrocardiogram (ECG). An ECG records your heartbeat patterns over a 24-hour period. You may also have other tests, such as: °· Transthoracic echocardiogram (TTE). During echocardiography, sound waves are used to evaluate how blood flows through your heart. °· Transesophageal echocardiogram (TEE). °· Cardiac monitoring. This allows your health care provider to monitor your heart rate and rhythm in real time. °· Holter monitor. This is a portable device that records your heartbeat and can help diagnose heart arrhythmias. It allows your health care provider to track your heart activity for several days, if needed. °· Stress tests by   exercise or by giving medicine that makes the heart beat faster. °TREATMENT  °· Treatment depends on what may be causing your chest pain. Treatment may include: °¨ Acid blockers for heartburn. °¨ Anti-inflammatory medicine. °¨ Pain medicine for inflammatory conditions. °¨ Antibiotics if an infection is present. °· You may be advised to change lifestyle habits. This includes stopping smoking and  avoiding alcohol, caffeine, and chocolate. °· You may be advised to keep your head raised (elevated) when sleeping. This reduces the chance of acid going backward from your stomach into your esophagus. °Most of the time, nonspecific chest pain will improve within 2-3 days with rest and mild pain medicine.  °HOME CARE INSTRUCTIONS  °· If antibiotics were prescribed, take them as directed. Finish them even if you start to feel better. °· For the next few days, avoid physical activities that bring on chest pain. Continue physical activities as directed. °· Do not use any tobacco products, including cigarettes, chewing tobacco, or electronic cigarettes. °· Avoid drinking alcohol. °· Only take medicine as directed by your health care provider. °· Follow your health care provider's suggestions for further testing if your chest pain does not go away. °· Keep any follow-up appointments you made. If you do not go to an appointment, you could develop lasting (chronic) problems with pain. If there is any problem keeping an appointment, call to reschedule. °SEEK MEDICAL CARE IF:  °· Your chest pain does not go away, even after treatment. °· You have a rash with blisters on your chest. °· You have a fever. °SEEK IMMEDIATE MEDICAL CARE IF:  °· You have increased chest pain or pain that spreads to your arm, neck, jaw, back, or abdomen. °· You have shortness of breath. °· You have an increasing cough, or you cough up blood. °· You have severe back or abdominal pain. °· You feel nauseous or vomit. °· You have severe weakness. °· You faint. °· You have chills. °This is an emergency. Do not wait to see if the pain will go away. Get medical help at once. Call your local emergency services (911 in [place] Do not drive yourself to the hospital. °MAKE SURE YOU:  °· Understand these instructions. °· Will watch your condition. °· Will get help right away if you are not doing well or get worse. °Document Released: 04/06/2005 Document Revised:  07/02/2013 Document Reviewed: 01/31/2008 °ExitCare® Patient Information ©2015 [employer]. This information is not intended to replace advice given to you by your health care provider. Make sure you discuss any questions you have with your health care provider. ° °

## 2014-02-10 NOTE — ED Notes (Signed)
Chest pain. Tightness x 2 days.

## 2014-02-10 NOTE — ED Notes (Signed)
MD at bedside. 

## 2014-04-04 ENCOUNTER — Encounter: Payer: Self-pay | Admitting: Gastroenterology

## 2014-04-17 ENCOUNTER — Other Ambulatory Visit: Payer: Self-pay

## 2014-04-17 MED ORDER — LISINOPRIL-HYDROCHLOROTHIAZIDE 20-12.5 MG PO TABS: ORAL_TABLET | ORAL | Status: DC

## 2014-04-25 ENCOUNTER — Other Ambulatory Visit: Payer: Self-pay

## 2014-08-04 ENCOUNTER — Other Ambulatory Visit: Payer: Self-pay

## 2014-08-04 MED ORDER — LISINOPRIL-HYDROCHLOROTHIAZIDE 20-12.5 MG PO TABS: ORAL_TABLET | ORAL | Status: DC

## 2014-08-07 ENCOUNTER — Telehealth: Payer: Self-pay | Admitting: Family Medicine

## 2014-08-07 NOTE — Telephone Encounter (Signed)
Caller name:[place], Gwyndolyn Saxon Relation to AJ:LUNG Call back number:[phone]  Reason for call: pt need rx  celecoxib (CELEBREX) 200 MG capsule and sildenafil (VIAGRA) 100 MG tablet, pt states this will be the pharmacy that he will be using from now on

## 2014-08-07 NOTE — Telephone Encounter (Signed)
He needs to be seen.      KP

## 2014-08-08 NOTE — Telephone Encounter (Signed)
Appointment scheduled.

## 2014-08-14 ENCOUNTER — Ambulatory Visit (INDEPENDENT_AMBULATORY_CARE_PROVIDER_SITE_OTHER): Payer: Self-pay | Admitting: Family Medicine

## 2014-08-14 ENCOUNTER — Encounter: Payer: Self-pay | Admitting: Family Medicine

## 2014-08-14 ENCOUNTER — Ambulatory Visit (HOSPITAL_BASED_OUTPATIENT_CLINIC_OR_DEPARTMENT_OTHER)
Admission: RE | Admit: 2014-08-14 | Discharge: 2014-08-14 | Disposition: A | Discharge status: Home / Self Care | Payer: Managed Care, Other (non HMO) | Source: Ambulatory Visit | Attending: Family Medicine | Admitting: Family Medicine

## 2014-08-14 VITALS — BP 124/70 | HR 79 | Temp 97.8°F | Wt 214.2 lb

## 2014-08-14 DIAGNOSIS — N521 Erectile dysfunction due to diseases classified elsewhere: Secondary | ICD-10-CM

## 2014-08-14 DIAGNOSIS — E785 Hyperlipidemia, unspecified: Secondary | ICD-10-CM

## 2014-08-14 DIAGNOSIS — M546 Pain in thoracic spine: Secondary | ICD-10-CM | POA: Insufficient documentation

## 2014-08-14 DIAGNOSIS — M1990 Unspecified osteoarthritis, unspecified site: Secondary | ICD-10-CM

## 2014-08-14 DIAGNOSIS — F411 Generalized anxiety disorder: Secondary | ICD-10-CM

## 2014-08-14 DIAGNOSIS — M1610 Unilateral primary osteoarthritis, unspecified hip: Secondary | ICD-10-CM

## 2014-08-14 DIAGNOSIS — Z23 Encounter for immunization: Secondary | ICD-10-CM

## 2014-08-14 DIAGNOSIS — I10 Essential (primary) hypertension: Secondary | ICD-10-CM

## 2014-08-14 MED ORDER — LISINOPRIL-HYDROCHLOROTHIAZIDE 20-12.5 MG PO TABS: ORAL_TABLET | ORAL | Status: DC

## 2014-08-14 MED ORDER — ROSUVASTATIN CALCIUM 20 MG PO TABS: 20.0000 mg | ORAL_TABLET | Freq: Every day | ORAL | Status: DC

## 2014-08-14 MED ORDER — SILDENAFIL CITRATE 100 MG PO TABS: 50.0000 mg | ORAL_TABLET | Freq: Every day | ORAL | Status: DC | PRN

## 2014-08-14 MED ORDER — CELECOXIB 200 MG PO CAPS
200.0000 mg | ORAL_CAPSULE | Freq: Every day | ORAL | Status: DC | PRN
Start: 2014-08-14 — End: 2014-08-18

## 2014-08-14 MED ORDER — CYCLOBENZAPRINE HCL 10 MG PO TABS: 10.0000 mg | ORAL_TABLET | Freq: Three times a day (TID) | ORAL | Status: DC | PRN

## 2014-08-14 MED ORDER — DIAZEPAM 5 MG PO TABS: 5.0000 mg | ORAL_TABLET | Freq: Three times a day (TID) | ORAL | Status: DC | PRN

## 2014-08-14 MED ORDER — FENOFIBRATE MICRONIZED 90 MG PO CAPS: 1.0000 | ORAL_CAPSULE | Freq: Every day | ORAL | Status: DC

## 2014-08-14 MED ORDER — IBUPROFEN 800 MG PO TABS: 800.0000 mg | ORAL_TABLET | Freq: Three times a day (TID) | ORAL | Status: DC | PRN

## 2014-08-14 NOTE — Progress Notes (Signed)
Subjective:    Patient here for follow-up of elevated blood pressure.  He is not exercising and is adherent to a low-salt diet.  Blood pressure is well controlled at home. Cardiac symptoms: none. Patient denies: chest pain, chest pressure/discomfort, claudication, dyspnea, exertional chest pressure/discomfort, fatigue, irregular heart beat, lower extremity edema, near-syncope, orthopnea, palpitations, paroxysmal nocturnal dyspnea, syncope and tachypnea. Cardiovascular risk factors: dyslipidemia, hypertension, male gender and sedentary lifestyle. Use of agents associated with hypertension: none. History of target organ damage: none.  Pt is also here f//u fall in hotel room in [place] 2 weeks ago.  He leaned back in chair and it fell over and he hit his head.  Pt states he passed out--- went to ER ,  CT, MRI etc all neg.  The following portions of the patient's history were reviewed and updated as appropriate: allergies, current medications, past family history, past medical history, past social history, past surgical history and problem list.  Review of Systems Review of Systems  Constitutional: Negative for activity change, appetite change and fatigue.  HENT: Negative for hearing loss, congestion, tinnitus and ear discharge.  dentist q71m Eyes: Negative for visual disturbance (see optho q1y -- vision corrected to 20/20 with glasses).  Respiratory: Negative for cough, chest tightness and shortness of breath.   Cardiovascular: Negative for chest pain, palpitations and leg swelling.  Gastrointestinal: Negative for abdominal pain, diarrhea, constipation and abdominal distention.  Genitourinary: Negative for urgency, frequency, decreased urine volume and difficulty urinating.  Musculoskeletal: Negative for back pain, arthralgias and gait problem.  Skin: Negative for color change, pallor and rash.  Neurological: Negative for dizziness, light-headedness, numbness and headaches.  Hematological: Negative  for adenopathy. Does not bruise/bleed easily.  Psychiatric/Behavioral: Negative for suicidal ideas, confusion, sleep disturbance, self-injury, dysphoric mood, decreased concentration and agitation.         Objective:    BP 124/70 mmHg  Pulse 79  Temp(Src) 97.8 F (36.6 C) (Oral)  Wt 214 lb 3.2 oz (97.16 kg)  SpO2 94% General appearance: alert, cooperative, appears stated age and no distress Throat: lips, mucosa, and tongue normal; teeth and gums normal Neck: no adenopathy, no carotid bruit, no JVD, supple, symmetrical, trachea midline and thyroid not enlarged, symmetric, no tenderness/mass/nodules Lungs: clear to auscultation bilaterally Heart: regular rate and rhythm, S1, S2 normal, no murmur, click, rub or gallop Extremities: extremities normal, atraumatic, no cyanosis or edema Neurologic: Alert and oriented X 3, normal strength and tone. Normal symmetric reflexes. Normal coordination and gait    Assessment:    Hypertension, normal blood pressure . Evidence of target organ damage: none.    Plan:    Medication: no change. Dietary sodium restriction. Regular aerobic exercise. Follow up: 3 months and as needed.    1. Hip arthritis  - celecoxib (CELEBREX) 200 MG capsule; Take 1 capsule (200 mg total) by mouth daily as needed.  Dispense: 30 capsule; Refill: 0 - ibuprofen (ADVIL,MOTRIN) 800 MG tablet; Take 1 tablet (800 mg total) by mouth every 8 (eight) hours as needed for mild pain.  Dispense: 30 tablet; Refill: 0  2. Hyperlipidemia  - Fenofibrate Micronized (ANTARA) 90 MG CAPS; Take 1 capsule by mouth daily.  Dispense: 90 capsule; Refill: 1 - rosuvastatin (CRESTOR) 20 MG tablet; Take 1 tablet (20 mg total) by mouth daily.  Dispense: 90 tablet; Refill: 1 - Hepatic function panel - Lipid panel - POCT urinalysis dipstick  3. Generalized anxiety disorder  - diazepam (VALIUM) 5 MG tablet; Take 1 tablet (5 mg total)  by mouth every 8 (eight) hours as needed for muscle  spasms.  Dispense: 10 tablet; Refill: 0  4. Essential hypertension  - lisinopril-hydrochlorothiazide (ZESTORETIC) 20-12.5 MG per tablet; 2 po qd-- Office visit due now  Dispense: 60 tablet; Refill: 0 - Basic metabolic panel - CBC with Differential/Platelet - POCT urinalysis dipstick  5. Erectile dysfunction due to diseases classified elsewhere  - sildenafil (VIAGRA) 100 MG tablet; Take 0.5-1 tablets (50-100 mg total) by mouth daily as needed for erectile dysfunction.  Dispense: 10 tablet; Refill: 0  6. Thoracic spine pain  - DG Thoracic Spine 2 View; Future - cyclobenzaprine (FLEXERIL) 10 MG tablet; Take 1 tablet (10 mg total) by mouth 3 (three) times daily as needed for muscle spasms.  Dispense: 30 tablet; Refill: 0  7. Immunization due  - Flu Vaccine QUAD 36+ mos PF IM (Fluarix Quad PF)

## 2014-08-14 NOTE — Patient Instructions (Signed)

## 2014-08-14 NOTE — Progress Notes (Signed)
Pre visit review using our clinic review tool, if applicable. No additional management support is needed unless otherwise documented below in the visit note. 

## 2014-08-18 ENCOUNTER — Telehealth: Payer: Self-pay | Admitting: Family Medicine

## 2014-08-18 DIAGNOSIS — E785 Hyperlipidemia, unspecified: Secondary | ICD-10-CM

## 2014-08-18 DIAGNOSIS — F411 Generalized anxiety disorder: Secondary | ICD-10-CM

## 2014-08-18 DIAGNOSIS — N521 Erectile dysfunction due to diseases classified elsewhere: Secondary | ICD-10-CM

## 2014-08-18 DIAGNOSIS — M1610 Unilateral primary osteoarthritis, unspecified hip: Secondary | ICD-10-CM

## 2014-08-18 DIAGNOSIS — I10 Essential (primary) hypertension: Secondary | ICD-10-CM

## 2014-08-18 MED ORDER — CYCLOBENZAPRINE HCL 10 MG PO TABS
10.0000 mg | ORAL_TABLET | Freq: Three times a day (TID) | ORAL | Status: DC | PRN
Start: 2014-08-18 — End: 2015-02-09

## 2014-08-18 MED ORDER — FENOFIBRATE MICRONIZED 90 MG PO CAPS: 1.0000 | ORAL_CAPSULE | Freq: Every day | ORAL | Status: DC

## 2014-08-18 MED ORDER — SILDENAFIL CITRATE 100 MG PO TABS: 50.0000 mg | ORAL_TABLET | Freq: Every day | ORAL | Status: DC | PRN

## 2014-08-18 MED ORDER — CELECOXIB 200 MG PO CAPS: 200.0000 mg | ORAL_CAPSULE | Freq: Every day | ORAL | Status: DC | PRN

## 2014-08-18 MED ORDER — ROSUVASTATIN CALCIUM 20 MG PO TABS: 20.0000 mg | ORAL_TABLET | Freq: Every day | ORAL | Status: DC

## 2014-08-18 MED ORDER — IBUPROFEN 800 MG PO TABS: 800.0000 mg | ORAL_TABLET | Freq: Three times a day (TID) | ORAL | Status: DC | PRN

## 2014-08-18 MED ORDER — LISINOPRIL-HYDROCHLOROTHIAZIDE 20-12.5 MG PO TABS: ORAL_TABLET | ORAL | Status: DC

## 2014-08-18 MED ORDER — DIAZEPAM 5 MG PO TABS: 5.0000 mg | ORAL_TABLET | Freq: Three times a day (TID) | ORAL | Status: DC | PRN

## 2014-08-18 NOTE — Telephone Encounter (Signed)
Rx faxed.    KP 

## 2014-08-18 NOTE — Telephone Encounter (Signed)
Please advise 

## 2014-08-18 NOTE — Telephone Encounter (Signed)
Caller name:Foday Oswald Relationship to patient:Self Can be reached:[facility] Pharmacy: [facility] [phone]  Reason for call: Pt states RX were called into wrong pharmacy- requesting that below be sent to [facility] listed above. celecoxib (CELEBREX) 200 MG capsule 30 capsule 0 08/14/2014      Take 1 capsule (200 mg total) by mouth daily as needed. - Oral    diazepam (VALIUM) 5 MG tablet 10 tablet 0 08/14/2014     Take 1 tablet (5 mg total) by mouth every 8 (eight) hours as needed for muscle spasms. - Oral    Fenofibrate Micronized (ANTARA) 90 MG CAPS 90 capsule 1 08/14/2014     Take 1 capsule by mouth daily. - Oral    ibuprofen (ADVIL,MOTRIN) 800 MG tablet 30 tablet 0 08/14/2014     Take 1 tablet (800 mg total) by mouth every 8 (eight) hours as needed for mild pain. - Oral    lisinopril-hydrochlorothiazide (ZESTORETIC) 20-12.5 MG per tablet 60 tablet 0 08/14/2014     2 po qd-- Office visit due now    rosuvastatin (CRESTOR) 20 MG tablet 90 tablet 1 08/14/2014 08/14/2015    Take 1 tablet (20 mg total) by mouth daily. - Oral    sildenafil (VIAGRA) 100 MG tablet 10 tablet 0 08/14/2014     Take 0.5-1 tablets (50-100 mg total) by mouth daily as needed for erectile dysfunction. - Oral    cyclobenzaprine (FLEXERIL) 10 MG tablet 30 tablet 0 08/14/2014     Take 1 tablet (10 mg total) by mouth 3 (three) times daily as needed for muscle spasms. - Oral

## 2014-08-18 NOTE — Telephone Encounter (Signed)
[facility] Relation to pt: Call back number: [phone] Pharmacy:  Reason for call:   crestor is not covered under patient assistance. Covers atorvastatin, lovastatin, and pravastatin. Would like to know if this could be changed?

## 2014-08-22 ENCOUNTER — Other Ambulatory Visit: Payer: Medicare Other

## 2014-10-07 ENCOUNTER — Other Ambulatory Visit: Payer: Self-pay | Admitting: Family Medicine

## 2014-12-19 ENCOUNTER — Other Ambulatory Visit: Payer: Self-pay

## 2014-12-19 MED ORDER — LISINOPRIL-HYDROCHLOROTHIAZIDE 20-12.5 MG PO TABS: 2.0000 | ORAL_TABLET | Freq: Every day | ORAL | Status: DC

## 2015-01-23 ENCOUNTER — Ambulatory Visit: Payer: Medicare Other | Admitting: Family Medicine

## 2015-01-23 DIAGNOSIS — Z0289 Encounter for other administrative examinations: Secondary | ICD-10-CM

## 2015-01-26 ENCOUNTER — Telehealth: Payer: Self-pay | Admitting: Family Medicine

## 2015-01-26 NOTE — Telephone Encounter (Signed)
pre visit letter mailed 01/22/15

## 2015-02-05 ENCOUNTER — Telehealth: Payer: Self-pay | Admitting: Family Medicine

## 2015-02-05 NOTE — Telephone Encounter (Signed)
Pt was no show 01/23/15 10:15am, acute appt for cough, cpe scheduled for 02/12/15, charge for no show?

## 2015-02-05 NOTE — Telephone Encounter (Signed)
yes

## 2015-02-09 ENCOUNTER — Ambulatory Visit: Payer: Medicare Other | Admitting: Family Medicine

## 2015-02-09 ENCOUNTER — Ambulatory Visit (INDEPENDENT_AMBULATORY_CARE_PROVIDER_SITE_OTHER): Payer: Self-pay | Admitting: Family Medicine

## 2015-02-09 ENCOUNTER — Encounter: Payer: Self-pay | Admitting: Family Medicine

## 2015-02-09 VITALS — BP 124/76 | HR 58 | Temp 97.9°F | Ht 72.0 in | Wt 219.6 lb

## 2015-02-09 DIAGNOSIS — E785 Hyperlipidemia, unspecified: Secondary | ICD-10-CM

## 2015-02-09 DIAGNOSIS — M1610 Unilateral primary osteoarthritis, unspecified hip: Secondary | ICD-10-CM

## 2015-02-09 DIAGNOSIS — G43001 Migraine without aura, not intractable, with status migrainosus: Secondary | ICD-10-CM

## 2015-02-09 DIAGNOSIS — I10 Essential (primary) hypertension: Secondary | ICD-10-CM

## 2015-02-09 DIAGNOSIS — R351 Nocturia: Secondary | ICD-10-CM

## 2015-02-09 DIAGNOSIS — M1990 Unspecified osteoarthritis, unspecified site: Secondary | ICD-10-CM

## 2015-02-09 DIAGNOSIS — N521 Erectile dysfunction due to diseases classified elsewhere: Secondary | ICD-10-CM

## 2015-02-09 DIAGNOSIS — J209 Acute bronchitis, unspecified: Secondary | ICD-10-CM

## 2015-02-09 MED ORDER — FENOFIBRATE MICRONIZED 90 MG PO CAPS: 1.0000 | ORAL_CAPSULE | Freq: Every day | ORAL | Status: DC

## 2015-02-09 MED ORDER — LISINOPRIL-HYDROCHLOROTHIAZIDE 20-12.5 MG PO TABS: 2.0000 | ORAL_TABLET | Freq: Every day | ORAL | Status: DC

## 2015-02-09 MED ORDER — CYCLOBENZAPRINE HCL 10 MG PO TABS: 10.0000 mg | ORAL_TABLET | Freq: Three times a day (TID) | ORAL | Status: DC | PRN

## 2015-02-09 MED ORDER — SILDENAFIL CITRATE 100 MG PO TABS: 50.0000 mg | ORAL_TABLET | Freq: Every day | ORAL | Status: DC | PRN

## 2015-02-09 MED ORDER — OXYCODONE-ACETAMINOPHEN 5-325 MG PO TABS: 1.0000 | ORAL_TABLET | ORAL | Status: DC | PRN

## 2015-02-09 MED ORDER — AZITHROMYCIN 250 MG PO TABS: ORAL_TABLET | ORAL | Status: DC

## 2015-02-09 MED ORDER — IBUPROFEN 800 MG PO TABS: 800.0000 mg | ORAL_TABLET | Freq: Three times a day (TID) | ORAL | Status: DC | PRN

## 2015-02-09 MED ORDER — BECLOMETHASONE DIPROPIONATE 40 MCG/ACT IN AERS: 2.0000 | INHALATION_SPRAY | Freq: Two times a day (BID) | RESPIRATORY_TRACT | Status: DC

## 2015-02-09 MED ORDER — ROSUVASTATIN CALCIUM 20 MG PO TABS: 20.0000 mg | ORAL_TABLET | Freq: Every day | ORAL | Status: DC

## 2015-02-09 MED ORDER — CELECOXIB 200 MG PO CAPS: 200.0000 mg | ORAL_CAPSULE | Freq: Every day | ORAL | Status: DC | PRN

## 2015-02-09 MED ORDER — ALBUTEROL SULFATE HFA 108 (90 BASE) MCG/ACT IN AERS: 2.0000 | INHALATION_SPRAY | Freq: Four times a day (QID) | RESPIRATORY_TRACT | Status: DC | PRN

## 2015-02-09 MED ORDER — BUTALBITAL-APAP-CAFFEINE 50-325-40 MG PO TABS: 1.0000 | ORAL_TABLET | Freq: Four times a day (QID) | ORAL | Status: DC | PRN

## 2015-02-09 NOTE — Progress Notes (Signed)
Pre visit review using our clinic review tool, if applicable. No additional management support is needed unless otherwise documented below in the visit note. 

## 2015-02-09 NOTE — Progress Notes (Signed)
Patient ID: Dennis Moore, male    DOB: [birth year]  Age: 59 y.o. MRN: [id number]    Subjective:  Subjective HPI Dennis Moore presents for f/u cholesterol and bp.  He ran out of all of his meds.  He is fasting for labs.  He is also c/o cough , wheezing with some drainage for 3 weeks.  No fever. Cough is not productive.  No otc meds.   No cp, palpitations..    Review of Systems  Constitutional: Negative for fever and chills.  HENT: Positive for congestion and postnasal drip. Negative for rhinorrhea and sinus pressure.   Respiratory: Positive for cough, chest tightness, shortness of breath and wheezing.   Cardiovascular: Negative for chest pain, palpitations and leg swelling.  Allergic/Immunologic: Negative for environmental allergies.  Psychiatric/Behavioral: Negative for decreased concentration. The patient is not nervous/anxious.     History Past Medical History  Diagnosis Date  . Depression   . Hyperlipidemia   . Hypertension   . Erectile dysfunction   . Tubular adenoma of colon 11/2008  . Internal hemorrhoids   . BPH (benign prostatic hypertrophy)   . Allergic rhinitis     He has past surgical history that includes Appendectomy; Elbow surgery; Carpal tunnel release; and Inguinal hernia repair.   His family history includes Alzheimer's disease in his paternal uncle; Colon cancer in his maternal uncle; Heart disease in his father and mother; Hyperlipidemia in his father; Hypertension in his father; Parkinsonism in his father.He reports that he has never smoked. He has never used smokeless tobacco. He reports that he drinks alcohol. He reports that he does not use illicit drugs.  Current Outpatient Prescriptions on File Prior to Visit  Medication Sig Dispense Refill  . diazepam (VALIUM) 5 MG tablet Take 1 tablet (5 mg total) by mouth every 8 (eight) hours as needed for muscle spasms. 10 tablet 0   No current facility-administered medications on file prior to visit.       Objective:  Objective Physical Exam  Constitutional: He is oriented to person, place, and time. Vital signs are normal. He appears well-developed and well-nourished. He is sleeping.  HENT:  Head: Normocephalic and atraumatic.  Right Ear: Tympanic membrane and ear canal normal.  Left Ear: Tympanic membrane and ear canal normal.  Nose: No mucosal edema or sinus tenderness. Right sinus exhibits no maxillary sinus tenderness and no frontal sinus tenderness. Left sinus exhibits no maxillary sinus tenderness and no frontal sinus tenderness.  Mouth/Throat: Oropharynx is clear and moist. No oropharyngeal exudate, posterior oropharyngeal edema or posterior oropharyngeal erythema.  Eyes: EOM are normal. Pupils are equal, round, and reactive to light.  Neck: Normal range of motion. Neck supple. No thyromegaly present.  Cardiovascular: Normal rate and regular rhythm.   No murmur heard. Pulmonary/Chest: Effort normal. No respiratory distress. He has wheezes. He has no rales. He exhibits no tenderness.  Musculoskeletal: He exhibits no edema or tenderness.  Neurological: He is alert and oriented to person, place, and time.  Skin: Skin is warm and dry.  Psychiatric: He has a normal mood and affect. His behavior is normal. Judgment and thought content normal.   BP 124/76 mmHg  Pulse 58  Temp(Src) 97.9 F (36.6 C) (Oral)  Ht 6' (1.829 m)  Wt 219 lb 9.6 oz (99.61 kg)  BMI 29.78 kg/m2  SpO2 98% Wt Readings from Last 3 Encounters:  02/09/15 219 lb 9.6 oz (99.61 kg)  08/14/14 214 lb 3.2 oz (97.16 kg)  02/10/14 210 lb (95.255 kg)  Lab Results  Component Value Date   WBC 8.8 02/10/2014   HGB 12.9* 02/10/2014   HCT 37.6* 02/10/2014   PLT 292 02/10/2014   GLUCOSE 104* 02/10/2014   CHOL 372* 12/11/2012   TRIG * 12/11/2012    445.0 Triglyceride is over 400; calculations on Lipids are invalid.   HDL 89.10 12/11/2012   LDLDIRECT 134.1 12/11/2012   LDLCALC 67 10/12/2010   ALT 20 01/14/2013    AST 17 01/14/2013   NA 140 02/10/2014   K 3.8 02/10/2014   CL 99 02/10/2014   CREATININE 1.60* 02/10/2014   BUN 20 02/10/2014   CO2 26 02/10/2014   TSH 1.09 03/19/2012   PSA 0.44 07/16/2012   HGBA1C 5.5 12/11/2012   MICROALBUR 3.4* 12/11/2012    Dg Thoracic Spine 2 View  08/15/2014   CLINICAL DATA:  Persistent upper back pain since a fall 1 month ago.  EXAM: THORACIC SPINE - 2 VIEW  COMPARISON:  Chest x-ray of February 10, 2014.  FINDINGS: The thoracic vertebral bodies are preserved in height. The intervertebral disc space heights are reasonably well maintained. The pedicles and transverse processes are intact. There are no abnormal paravertebral soft tissue densities. There is minimal curvature of the upper thoracic spine which is likely positional.  IMPRESSION: There is no acute or significant chronic bony abnormality of the thoracic spine.   Electronically Signed   By: David  Martinique   On: 08/15/2014 07:36     Assessment & Plan:  Plan I have discontinued Mr. Granberg's HYDROcodone-acetaminophen and naproxen. I am also having him start on azithromycin. Additionally, I am having him maintain his diazepam, butalbital-acetaminophen-caffeine, ibuprofen, oxyCODONE-acetaminophen, albuterol, beclomethasone, celecoxib, cyclobenzaprine, Fenofibrate Micronized, lisinopril-hydrochlorothiazide, rosuvastatin, and sildenafil.  Meds ordered this encounter  Medications  . DISCONTD: celecoxib (CELEBREX) 200 MG capsule    Sig: Take 1 capsule (200 mg total) by mouth daily as needed.    Dispense:  30 capsule    Refill:  0  . DISCONTD: cyclobenzaprine (FLEXERIL) 10 MG tablet    Sig: Take 1 tablet (10 mg total) by mouth 3 (three) times daily as needed.    Dispense:  30 tablet    Refill:  0  . butalbital-acetaminophen-caffeine (FIORICET, ESGIC) 50-325-40 MG per tablet    Sig: Take 1 tablet by mouth every 6 (six) hours as needed.    Dispense:  30 tablet    Refill:  0  . DISCONTD: albuterol (PROVENTIL  HFA;VENTOLIN HFA) 108 (90 BASE) MCG/ACT inhaler    Sig: Inhale 2 puffs into the lungs every 6 (six) hours as needed for wheezing.  Marland Kitchen DISCONTD: Fenofibrate Micronized (ANTARA) 90 MG CAPS    Sig: Take 1 capsule by mouth daily.    Dispense:  90 capsule    Refill:  1  . ibuprofen (ADVIL,MOTRIN) 800 MG tablet    Sig: Take 1 tablet (800 mg total) by mouth every 8 (eight) hours as needed for mild pain.    Dispense:  30 tablet    Refill:  0  . DISCONTD: lisinopril-hydrochlorothiazide (PRINZIDE,ZESTORETIC) 20-12.5 MG per tablet    Sig: Take 2 tablets by mouth daily.    Dispense:  60 tablet    Refill:  11    D/C PREVIOUS SCRIPTS FOR THIS MEDICATION  . DISCONTD: rosuvastatin (CRESTOR) 20 MG tablet    Sig: Take 1 tablet (20 mg total) by mouth daily.    Dispense:  90 tablet    Refill:  1  . oxyCODONE-acetaminophen (PERCOCET/ROXICET) 5-325 MG per tablet  Sig: Take 1 tablet by mouth every 4 (four) hours as needed.    Dispense:  30 tablet    Refill:  0  . DISCONTD: sildenafil (VIAGRA) 100 MG tablet    Sig: Take 0.5-1 tablets (50-100 mg total) by mouth daily as needed for erectile dysfunction.    Dispense:  10 tablet    Refill:  0  . DISCONTD: beclomethasone (QVAR) 40 MCG/ACT inhaler    Sig: Inhale 2 puffs into the lungs 2 (two) times daily.    Dispense:  1 Inhaler    Refill:  12  . azithromycin (ZITHROMAX) 250 MG tablet    Sig: As directed    Dispense:  6 tablet    Refill:  0  . albuterol (PROVENTIL HFA;VENTOLIN HFA) 108 (90 BASE) MCG/ACT inhaler    Sig: Inhale 2 puffs into the lungs every 6 (six) hours as needed for wheezing.    Dispense:  1 Inhaler    Refill:  5  . beclomethasone (QVAR) 40 MCG/ACT inhaler    Sig: Inhale 2 puffs into the lungs 2 (two) times daily.    Dispense:  1 Inhaler    Refill:  12  . celecoxib (CELEBREX) 200 MG capsule    Sig: Take 1 capsule (200 mg total) by mouth daily as needed.    Dispense:  30 capsule    Refill:  0  . cyclobenzaprine (FLEXERIL) 10 MG  tablet    Sig: Take 1 tablet (10 mg total) by mouth 3 (three) times daily as needed.    Dispense:  30 tablet    Refill:  0  . Fenofibrate Micronized (ANTARA) 90 MG CAPS    Sig: Take 1 capsule by mouth daily.    Dispense:  90 capsule    Refill:  1  . lisinopril-hydrochlorothiazide (PRINZIDE,ZESTORETIC) 20-12.5 MG per tablet    Sig: Take 2 tablets by mouth daily.    Dispense:  60 tablet    Refill:  11    D/C PREVIOUS SCRIPTS FOR THIS MEDICATION  . rosuvastatin (CRESTOR) 20 MG tablet    Sig: Take 1 tablet (20 mg total) by mouth daily.    Dispense:  90 tablet    Refill:  1  . sildenafil (VIAGRA) 100 MG tablet    Sig: Take 0.5-1 tablets (50-100 mg total) by mouth daily as needed for erectile dysfunction.    Dispense:  10 tablet    Refill:  0    Problem List Items Addressed This Visit    None    Visit Diagnoses    Acute bronchitis, unspecified organism    -  Primary    Relevant Medications    azithromycin (ZITHROMAX) 250 MG tablet    albuterol (PROVENTIL HFA;VENTOLIN HFA) 108 (90 BASE) MCG/ACT inhaler    beclomethasone (QVAR) 40 MCG/ACT inhaler    Hip arthritis        Relevant Medications    butalbital-acetaminophen-caffeine (FIORICET, ESGIC) 50-325-40 MG per tablet    ibuprofen (ADVIL,MOTRIN) 800 MG tablet    oxyCODONE-acetaminophen (PERCOCET/ROXICET) 5-325 MG per tablet    celecoxib (CELEBREX) 200 MG capsule    cyclobenzaprine (FLEXERIL) 10 MG tablet    Hyperlipidemia        Relevant Medications    Fenofibrate Micronized (ANTARA) 90 MG CAPS    lisinopril-hydrochlorothiazide (PRINZIDE,ZESTORETIC) 20-12.5 MG per tablet    rosuvastatin (CRESTOR) 20 MG tablet    sildenafil (VIAGRA) 100 MG tablet    Other Relevant Orders    Hepatic function panel  Lipid panel    Microalbumin / creatinine urine ratio    POCT urinalysis dipstick    PSA    TSH    Erectile dysfunction due to diseases classified elsewhere        Relevant Medications    sildenafil (VIAGRA) 100 MG tablet     Other Relevant Orders    PSA    Migraine without aura and with status migrainosus, not intractable        Relevant Medications    butalbital-acetaminophen-caffeine (FIORICET, ESGIC) 50-325-40 MG per tablet    ibuprofen (ADVIL,MOTRIN) 800 MG tablet    oxyCODONE-acetaminophen (PERCOCET/ROXICET) 5-325 MG per tablet    celecoxib (CELEBREX) 200 MG capsule    cyclobenzaprine (FLEXERIL) 10 MG tablet    Fenofibrate Micronized (ANTARA) 90 MG CAPS    lisinopril-hydrochlorothiazide (PRINZIDE,ZESTORETIC) 20-12.5 MG per tablet    rosuvastatin (CRESTOR) 20 MG tablet    sildenafil (VIAGRA) 100 MG tablet    Essential hypertension        Relevant Medications    Fenofibrate Micronized (ANTARA) 90 MG CAPS    lisinopril-hydrochlorothiazide (PRINZIDE,ZESTORETIC) 20-12.5 MG per tablet    rosuvastatin (CRESTOR) 20 MG tablet    sildenafil (VIAGRA) 100 MG tablet    Other Relevant Orders    Basic metabolic panel    CBC with Differential/Platelet    Microalbumin / creatinine urine ratio    POCT urinalysis dipstick    PSA    TSH    Nocturia        Relevant Orders    PSA       Follow-up: Return in about 6 months (around 08/12/2015), or if symptoms worsen or fail to improve, for hyperlipidemia, hypertension, annual exam, fasting.  Garnet Koyanagi, DO

## 2015-02-09 NOTE — Patient Instructions (Signed)

## 2015-02-10 LAB — BASIC METABOLIC PANEL
BUN: 20 mg/dL (ref 6–23)
CHLORIDE: 102 meq/L (ref 96–112)
CO2: 27 meq/L (ref 19–32)
Calcium: 9.4 mg/dL (ref 8.4–10.5)
Creatinine, Ser: 1.2 mg/dL (ref 0.40–1.50)
GFR: 79.49 mL/min (ref >60.00)
Glucose, Bld: 78 mg/dL (ref 70–99)
POTASSIUM: 3.5 meq/L (ref 3.5–5.1)
Sodium: 140 mEq/L (ref 135–145)

## 2015-02-10 LAB — LIPID PANEL
CHOL/HDL RATIO: 3
Cholesterol: 182 mg/dL (ref 0–200)
HDL: 57.8 mg/dL (ref >39.00)
NonHDL: 124.52
Triglycerides: 327 mg/dL — ABNORMAL HIGH (ref 0.0–149.0)
VLDL: 65.4 mg/dL — ABNORMAL HIGH (ref 0.0–40.0)

## 2015-02-10 LAB — LDL CHOLESTEROL, DIRECT: Direct LDL: 41 mg/dL

## 2015-02-10 LAB — PSA: PSA: 0.56 ng/mL (ref 0.10–4.00)

## 2015-02-10 LAB — CBC WITH DIFFERENTIAL/PLATELET
BASOS PCT: 0.3 % (ref 0.0–3.0)
Basophils Absolute: 0 10*3/uL (ref 0.0–0.1)
EOS ABS: 0.2 10*3/uL (ref 0.0–0.7)
EOS PCT: 2.6 % (ref 0.0–5.0)
HEMATOCRIT: 37.7 % — AB (ref 39.0–52.0)
Hemoglobin: 12.8 g/dL — ABNORMAL LOW (ref 13.0–17.0)
LYMPHS PCT: 34.9 % (ref 12.0–46.0)
Lymphs Abs: 2.9 10*3/uL (ref 0.7–4.0)
MCHC: 33.9 g/dL (ref 30.0–36.0)
MCV: 90.3 fl (ref 78.0–100.0)
MONO ABS: 0.5 10*3/uL (ref 0.1–1.0)
Monocytes Relative: 6.1 % (ref 3.0–12.0)
Neutro Abs: 4.7 10*3/uL (ref 1.4–7.7)
Neutrophils Relative %: 56.1 % (ref 43.0–77.0)
Platelets: 300 10*3/uL (ref 150.0–400.0)
RBC: 4.18 Mil/uL — AB (ref 4.22–5.81)
RDW: 15.8 % — AB (ref 11.5–15.5)
WBC: 8.4 10*3/uL (ref 4.0–10.5)

## 2015-02-10 LAB — HEPATIC FUNCTION PANEL
ALBUMIN: 4.4 g/dL (ref 3.5–5.2)
ALK PHOS: 48 U/L (ref 39–117)
ALT: 42 U/L (ref 0–53)
AST: 30 U/L (ref 0–37)
BILIRUBIN TOTAL: 0.8 mg/dL (ref 0.2–1.2)
Bilirubin, Direct: 0.1 mg/dL (ref 0.0–0.3)
Total Protein: 7.6 g/dL (ref 6.0–8.3)

## 2015-02-10 LAB — TSH: TSH: 2.48 u[IU]/mL (ref 0.35–4.50)

## 2015-02-11 ENCOUNTER — Telehealth: Payer: Self-pay

## 2015-02-11 LAB — POCT URINALYSIS DIPSTICK
BILIRUBIN UA: NEGATIVE
Blood, UA: NEGATIVE
GLUCOSE UA: NEGATIVE
KETONES UA: NEGATIVE
LEUKOCYTES UA: NEGATIVE
NITRITE UA: NEGATIVE
PROTEIN UA: NEGATIVE
Spec Grav, UA: 1.02
Urobilinogen, UA: NEGATIVE
pH, UA: 6

## 2015-02-11 LAB — MICROALBUMIN / CREATININE URINE RATIO
Creatinine,U: 119.8 mg/dL
MICROALB/CREAT RATIO: 0.6 mg/g (ref 0.0–30.0)

## 2015-02-11 NOTE — Addendum Note (Signed)
Addended by: Peggyann Shoals on: 02/11/2015 09:39 AM   Modules accepted: Orders

## 2015-02-11 NOTE — Telephone Encounter (Signed)
Pre visit call completed 

## 2015-02-12 ENCOUNTER — Encounter: Payer: Self-pay | Admitting: Family Medicine

## 2015-02-12 ENCOUNTER — Ambulatory Visit (INDEPENDENT_AMBULATORY_CARE_PROVIDER_SITE_OTHER): Payer: Self-pay | Admitting: Family Medicine

## 2015-02-12 VITALS — BP 118/66 | HR 59 | Temp 97.8°F | Ht 72.0 in | Wt 218.6 lb

## 2015-02-12 DIAGNOSIS — D6489 Other specified anemias: Secondary | ICD-10-CM

## 2015-02-12 DIAGNOSIS — R195 Other fecal abnormalities: Secondary | ICD-10-CM

## 2015-02-12 DIAGNOSIS — Z0000 Encounter for general adult medical examination without abnormal findings: Secondary | ICD-10-CM

## 2015-02-12 DIAGNOSIS — I10 Essential (primary) hypertension: Secondary | ICD-10-CM

## 2015-02-12 DIAGNOSIS — E781 Pure hyperglyceridemia: Secondary | ICD-10-CM

## 2015-02-12 DIAGNOSIS — E785 Hyperlipidemia, unspecified: Secondary | ICD-10-CM

## 2015-02-12 MED ORDER — FENOFIBRIC ACID 105 MG PO TABS: ORAL_TABLET | ORAL | Status: DC

## 2015-02-12 NOTE — Progress Notes (Signed)
Pre visit review using our clinic review tool, if applicable. No additional management support is needed unless otherwise documented below in the visit note. 

## 2015-02-12 NOTE — Progress Notes (Signed)
Subjective:   Dennis Moore is a 58 y.o. male who presents for Medicare Annual/Subsequent preventive examination.  Review of Systems:   Review of Systems  Constitutional: Negative for activity change, appetite change and fatigue.  HENT: Negative for hearing loss, congestion, tinnitus and ear discharge.   Eyes: Negative for visual disturbance (see optho q1y -- vision corrected to 20/20 with glasses).  Respiratory: Negative for cough, chest tightness and shortness of breath.   Cardiovascular: Negative for chest pain, palpitations and leg swelling.  Gastrointestinal: Negative for abdominal pain, diarrhea, constipation and abdominal distention.  Genitourinary: Negative for urgency, frequency, decreased urine volume and difficulty urinating.  Musculoskeletal: Negative for back pain, arthralgias and gait problem.  Skin: Negative for color change, pallor and rash.  Neurological: Negative for dizziness, light-headedness, numbness and headaches.  Hematological: Negative for adenopathy. Does not bruise/bleed easily.  Psychiatric/Behavioral: Negative for suicidal ideas, confusion, sleep disturbance, self-injury, dysphoric mood, decreased concentration and agitation.  Pt is able to read and write and can do all ADLs No risk for falling No abuse/ violence in home           Objective:    Vitals: BP 118/66 mmHg  Pulse 59  Temp(Src) 97.8 F (36.6 C) (Oral)  Ht 6' (1.829 m)  Wt 218 lb 9.6 oz (99.156 kg)  BMI 29.64 kg/m2  SpO2 98% BP 118/66 mmHg  Pulse 59  Temp(Src) 97.8 F (36.6 C) (Oral)  Ht 6' (1.829 m)  Wt 218 lb 9.6 oz (99.156 kg)  BMI 29.64 kg/m2  SpO2 98% General appearance: alert, cooperative, appears stated age and no distress Head: Normocephalic, without obvious abnormality, atraumatic Eyes: conjunctivae/corneas clear. PERRL, EOM's intact. Fundi benign. Ears: normal TM's and external ear canals both ears Nose: Nares normal. Septum midline. Mucosa normal. No drainage or  sinus tenderness. Throat: lips, mucosa, and tongue normal; teeth and gums normal Neck: no adenopathy, no carotid bruit, no JVD, supple, symmetrical, trachea midline and thyroid not enlarged, symmetric, no tenderness/mass/nodules Back: symmetric, no curvature. ROM normal. No CVA tenderness. Lungs: clear to auscultation bilaterally Chest wall: no tenderness Heart: regular rate and rhythm, S1, S2 normal, no murmur, click, rub or gallop Abdomen: soft, non-tender; bowel sounds normal; no masses,  no organomegaly Male genitalia: normal Rectal: the prostate is of normal size and consistency, nontender, & without nodules --heme+ brown stool Extremities: extremities normal, atraumatic, no cyanosis or edema Pulses: 2+ and symmetric Skin: Skin color, texture, turgor normal. No rashes or lesions Lymph nodes: Cervical, supraclavicular, and axillary nodes normal. Neurologic: Alert and oriented X 3, normal strength and tone. Normal symmetric reflexes. Normal coordination and gait Psych--  Normal   Tobacco History  Smoking status  . Never Smoker   Smokeless tobacco  . Never Used     Counseling given: Not Answered   Past Medical History  Diagnosis Date  . Depression   . Hyperlipidemia   . Hypertension   . Erectile dysfunction   . Tubular adenoma of colon 11/2008  . Internal hemorrhoids   . BPH (benign prostatic hypertrophy)   . Allergic rhinitis    Past Surgical History  Procedure Laterality Date  . Appendectomy    . Elbow surgery      right  . Carpal tunnel release      right  . Inguinal hernia repair      age 68   Family History  Problem Relation Age of Onset  . Alzheimer's disease Paternal Uncle   . Parkinsonism Father   .  Hyperlipidemia Father   . Hypertension Father   . Colon cancer Maternal Uncle   . Heart disease Father   . Heart disease Mother    History  Sexual Activity  . Sexual Activity: Not on file    Outpatient Encounter Prescriptions as of 02/12/2015    Medication Sig  . albuterol (PROVENTIL HFA;VENTOLIN HFA) 108 (90 BASE) MCG/ACT inhaler Inhale 2 puffs into the lungs every 6 (six) hours as needed for wheezing.  . beclomethasone (QVAR) 40 MCG/ACT inhaler Inhale 2 puffs into the lungs 2 (two) times daily.  . butalbital-acetaminophen-caffeine (FIORICET, ESGIC) 50-325-40 MG per tablet Take 1 tablet by mouth every 6 (six) hours as needed.  . celecoxib (CELEBREX) 200 MG capsule Take 1 capsule (200 mg total) by mouth daily as needed.  . cyclobenzaprine (FLEXERIL) 10 MG tablet Take 1 tablet (10 mg total) by mouth 3 (three) times daily as needed.  . diazepam (VALIUM) 5 MG tablet Take 1 tablet (5 mg total) by mouth every 8 (eight) hours as needed for muscle spasms.  Marland Kitchen ibuprofen (ADVIL,MOTRIN) 800 MG tablet Take 1 tablet (800 mg total) by mouth every 8 (eight) hours as needed for mild pain.  Marland Kitchen lisinopril-hydrochlorothiazide (PRINZIDE,ZESTORETIC) 20-12.5 MG per tablet Take 2 tablets by mouth daily.  Marland Kitchen oxyCODONE-acetaminophen (PERCOCET/ROXICET) 5-325 MG per tablet Take 1 tablet by mouth every 4 (four) hours as needed.  . rosuvastatin (CRESTOR) 20 MG tablet Take 1 tablet (20 mg total) by mouth daily.  . sildenafil (VIAGRA) 100 MG tablet Take 0.5-1 tablets (50-100 mg total) by mouth daily as needed for erectile dysfunction.  . [DISCONTINUED] azithromycin (ZITHROMAX) 250 MG tablet As directed  . [DISCONTINUED] Fenofibrate Micronized (ANTARA) 90 MG CAPS Take 1 capsule by mouth daily.  . Fenofibric Acid 105 MG TABS 1 po qd   No facility-administered encounter medications on file as of 02/12/2015.    Activities of Daily Living In your present state of health, do you have any difficulty performing the following activities: 02/12/2015 08/14/2014  Hearing? N N  Vision? N N  Difficulty concentrating or making decisions? N N  Walking or climbing stairs? N N  Dressing or bathing? N N  Doing errands, shopping? N N    Patient Care Team: Rosalita Chessman, DO as PCP -  General   Assessment:    CPE: Exercise Activities and Dietary recommendations-- con't exercise, diet    Goals    None     Fall Risk Fall Risk  02/12/2015  Falls in the past year? No   Depression Screen PHQ 2/9 Scores 02/12/2015  PHQ - 2 Score 0    Cognitive Testing Normal-MMSE 30/30  Immunization History  Administered Date(s) Administered  . Influenza Split 04/14/2011  . Influenza,inj,Quad PF,36+ Mos 08/14/2014  . Tdap 07/15/2011   Screening Tests Health Maintenance  Topic Date Due  . INFLUENZA VACCINE  04/11/2015 (Originally 02/09/2015)  . COLONOSCOPY  12/15/2016  . TETANUS/TDAP  07/14/2021  . HIV Screening  Completed      Plan:    During the course of the visit the patient was educated and counseled about the following appropriate screening and preventive services:   Vaccines to include Pneumoccal, Influenza, Hepatitis B, Td, Zostavax, HCV  Electrocardiogram  Cardiovascular Disease  Colorectal cancer screening  Diabetes screening  Prostate Cancer Screening  Glaucoma screening  Nutrition counseling   Smoking cessation counseling  Patient Instructions (the written plan) was given to the patient.   1. Hypertriglyceridemia, essential Lab Results  Component Value Date  CHOL 182 02/09/2015   HDL 57.80 02/09/2015   LDLCALC 67 10/12/2010   LDLDIRECT 41.0 02/09/2015   TRIG 327.0* 02/09/2015   CHOLHDL 3 02/09/2015    - Fenofibric Acid 105 MG TABS; 1 po qd  Dispense: 90 tablet; Refill: 1  2. Preventative health care See AVS, ghm utd, check labs  3. Essential hypertension Stable con't lisinopril hct 4. Hyperlipidemia Lab Results  Component Value Date   CHOL 182 02/09/2015   HDL 57.80 02/09/2015   LDLCALC 67 10/12/2010   LDLDIRECT 41.0 02/09/2015   TRIG 327.0* 02/09/2015   CHOLHDL 3 02/09/2015     5. Heme + stool Lab Results  Component Value Date   HGB 12.8* 02/09/2015    - Ambulatory referral to Gastroenterology  6. Anemia due to  other cause Lab Results  Component Value Date   HGB 12.8* 02/09/2015    - Ambulatory referral to Gastroenterology  Garnet Koyanagi, DO  02/12/2015

## 2015-02-12 NOTE — Patient Instructions (Signed)
Preventive Care for Adults A healthy lifestyle and preventive care can promote health and wellness. Preventive health guidelines for men include the following key practices:  A routine yearly physical is a good way to check with your health care provider about your health and preventative screening. It is a chance to share any concerns and updates on your health and to receive a thorough exam.  Visit your dentist for a routine exam and preventative care every 6 months. Brush your teeth twice a day and floss once a day. Good oral hygiene prevents tooth decay and gum disease.  The frequency of eye exams is based on your age, health, family medical history, use of contact lenses, and other factors. Follow your health care provider's recommendations for frequency of eye exams.  Eat a healthy diet. Foods such as vegetables, fruits, whole grains, low-fat dairy products, and lean protein foods contain the nutrients you need without too many calories. Decrease your intake of foods high in solid fats, added sugars, and salt. Eat the right amount of calories for you.Get information about a proper diet from your health care provider, if necessary.  Regular physical exercise is one of the most important things you can do for your health. Most adults should get at least 150 minutes of moderate-intensity exercise (any activity that increases your heart rate and causes you to sweat) each week. In addition, most adults need muscle-strengthening exercises on 2 or more days a week.  Maintain a healthy weight. The body mass index (BMI) is a screening tool to identify possible weight problems. It provides an estimate of body fat based on height and weight. Your health care provider can find your BMI and can help you achieve or maintain a healthy weight.For adults 20 years and older:  A BMI below 18.5 is considered underweight.  A BMI of 18.5 to 24.9 is normal.  A BMI of 25 to 29.9 is considered overweight.  A BMI  of 30 and above is considered obese.  Maintain normal blood lipids and cholesterol levels by exercising and minimizing your intake of saturated fat. Eat a balanced diet with plenty of fruit and vegetables. Blood tests for lipids and cholesterol should begin at age 50 and be repeated every 5 years. If your lipid or cholesterol levels are high, you are over 50, or you are at high risk for heart disease, you may need your cholesterol levels checked more frequently.Ongoing high lipid and cholesterol levels should be treated with medicines if diet and exercise are not working.  If you smoke, find out from your health care provider how to quit. If you do not use tobacco, do not start.  Lung cancer screening is recommended for adults aged 73-80 years who are at high risk for developing lung cancer because of a history of smoking. A yearly low-dose CT scan of the lungs is recommended for people who have at least a 30-pack-year history of smoking and are a current smoker or have quit within the past 15 years. A pack year of smoking is smoking an average of 1 pack of cigarettes a day for 1 year (for example: 1 pack a day for 30 years or 2 packs a day for 15 years). Yearly screening should continue until the smoker has stopped smoking for at least 15 years. Yearly screening should be stopped for people who develop a health problem that would prevent them from having lung cancer treatment.  If you choose to drink alcohol, do not have more than  2 drinks per day. One drink is considered to be 12 ounces (355 mL) of beer, 5 ounces (148 mL) of wine, or 1.5 ounces (44 mL) of liquor.  Avoid use of street drugs. Do not share needles with anyone. Ask for help if you need support or instructions about stopping the use of drugs.  High blood pressure causes heart disease and increases the risk of stroke. Your blood pressure should be checked at least every 1-2 years. Ongoing high blood pressure should be treated with  medicines, if weight loss and exercise are not effective.  If you are 45-79 years old, ask your health care provider if you should take aspirin to prevent heart disease.  Diabetes screening involves taking a blood sample to check your fasting blood sugar level. This should be done once every 3 years, after age 45, if you are within normal weight and without risk factors for diabetes. Testing should be considered at a younger age or be carried out more frequently if you are overweight and have at least 1 risk factor for diabetes.  Colorectal cancer can be detected and often prevented. Most routine colorectal cancer screening begins at the age of 50 and continues through age 75. However, your health care provider may recommend screening at an earlier age if you have risk factors for colon cancer. On a yearly basis, your health care provider may provide home test kits to check for hidden blood in the stool. Use of a small camera at the end of a tube to directly examine the colon (sigmoidoscopy or colonoscopy) can detect the earliest forms of colorectal cancer. Talk to your health care provider about this at age 50, when routine screening begins. Direct exam of the colon should be repeated every 5-10 years through age 75, unless early forms of precancerous polyps or small growths are found.  People who are at an increased risk for hepatitis B should be screened for this virus. You are considered at high risk for hepatitis B if:  You were born in a country where hepatitis B occurs often. Talk with your health care provider about which countries are considered high risk.  Your parents were born in a high-risk country and you have not received a shot to protect against hepatitis B (hepatitis B vaccine).  You have HIV or AIDS.  You use needles to inject street drugs.  You live with, or have sex with, someone who has hepatitis B.  You are a man who has sex with other men (MSM).  You get hemodialysis  treatment.  You take certain medicines for conditions such as cancer, organ transplantation, and autoimmune conditions.  Hepatitis C blood testing is recommended for all people born from 1945 through 1965 and any individual with known risks for hepatitis C.  Practice safe sex. Use condoms and avoid high-risk sexual practices to reduce the spread of sexually transmitted infections (STIs). STIs include gonorrhea, chlamydia, syphilis, trichomonas, herpes, HPV, and human immunodeficiency virus (HIV). Herpes, HIV, and HPV are viral illnesses that have no cure. They can result in disability, cancer, and death.  If you are at risk of being infected with HIV, it is recommended that you take a prescription medicine daily to prevent HIV infection. This is called preexposure prophylaxis (PrEP). You are considered at risk if:  You are a man who has sex with other men (MSM) and have other risk factors.  You are a heterosexual man, are sexually active, and are at increased risk for HIV infection.    You take drugs by injection.  You are sexually active with a partner who has HIV.  Talk with your health care provider about whether you are at high risk of being infected with HIV. If you choose to begin PrEP, you should first be tested for HIV. You should then be tested every 3 months for as long as you are taking PrEP.  A one-time screening for abdominal aortic aneurysm (AAA) and surgical repair of large AAAs by ultrasound are recommended for men ages 32 to 67 years who are current or former smokers.  Healthy men should no longer receive prostate-specific antigen (PSA) blood tests as part of routine cancer screening. Talk with your health care provider about prostate cancer screening.  Testicular cancer screening is not recommended for adult males who have no symptoms. Screening includes self-exam, a health care provider exam, and other screening tests. Consult with your health care provider about any symptoms  you have or any concerns you have about testicular cancer.  Use sunscreen. Apply sunscreen liberally and repeatedly throughout the day. You should seek shade when your shadow is shorter than you. Protect yourself by wearing long sleeves, pants, a wide-brimmed hat, and sunglasses year round, whenever you are outdoors.  Once a month, do a whole-body skin exam, using a mirror to look at the skin on your back. Tell your health care provider about new moles, moles that have irregular borders, moles that are larger than a pencil eraser, or moles that have changed in shape or color.  Stay current with required vaccines (immunizations).  Influenza vaccine. All adults should be immunized every year.  Tetanus, diphtheria, and acellular pertussis (Td, Tdap) vaccine. An adult who has not previously received Tdap or who does not know his vaccine status should receive 1 dose of Tdap. This initial dose should be followed by tetanus and diphtheria toxoids (Td) booster doses every 10 years. Adults with an unknown or incomplete history of completing a 3-dose immunization series with Td-containing vaccines should begin or complete a primary immunization series including a Tdap dose. Adults should receive a Td booster every 10 years.  Varicella vaccine. An adult without evidence of immunity to varicella should receive 2 doses or a second dose if he has previously received 1 dose.  Human papillomavirus (HPV) vaccine. Males aged 68-21 years who have not received the vaccine previously should receive the 3-dose series. Males aged 22-26 years may be immunized. Immunization is recommended through the age of 6 years for any male who has sex with males and did not get any or all doses earlier. Immunization is recommended for any person with an immunocompromised condition through the age of 49 years if he did not get any or all doses earlier. During the 3-dose series, the second dose should be obtained 4-8 weeks after the first  dose. The third dose should be obtained 24 weeks after the first dose and 16 weeks after the second dose.  Zoster vaccine. One dose is recommended for adults aged 50 years or older unless certain conditions are present.  Measles, mumps, and rubella (MMR) vaccine. Adults born before [birth year] generally are considered immune to measles and mumps. Adults born in [birth year] or later should have 1 or more doses of MMR vaccine unless there is a contraindication to the vaccine or there is laboratory evidence of immunity to each of the three diseases. A routine second dose of MMR vaccine should be obtained at least 28 days after the first dose for students attending postsecondary  schools, health care workers, or international travelers. People who received inactivated measles vaccine or an unknown type of measles vaccine during 1963-1967 should receive 2 doses of MMR vaccine. People who received inactivated mumps vaccine or an unknown type of mumps vaccine before 1979 and are at high risk for mumps infection should consider immunization with 2 doses of MMR vaccine. Unvaccinated health care workers born before 1957 who lack laboratory evidence of measles, mumps, or rubella immunity or laboratory confirmation of disease should consider measles and mumps immunization with 2 doses of MMR vaccine or rubella immunization with 1 dose of MMR vaccine.  Pneumococcal 13-valent conjugate (PCV13) vaccine. When indicated, a person who is uncertain of his immunization history and has no record of immunization should receive the PCV13 vaccine. An adult aged 19 years or older who has certain medical conditions and has not been previously immunized should receive 1 dose of PCV13 vaccine. This PCV13 should be followed with a dose of pneumococcal polysaccharide (PPSV23) vaccine. The PPSV23 vaccine dose should be obtained at least 8 weeks after the dose of PCV13 vaccine. An adult aged 19 years or older who has certain medical conditions and  previously received 1 or more doses of PPSV23 vaccine should receive 1 dose of PCV13. The PCV13 vaccine dose should be obtained 1 or more years after the last PPSV23 vaccine dose.  Pneumococcal polysaccharide (PPSV23) vaccine. When PCV13 is also indicated, PCV13 should be obtained first. All adults aged 65 years and older should be immunized. An adult younger than age 65 years who has certain medical conditions should be immunized. Any person who resides in a nursing home or long-term care facility should be immunized. An adult smoker should be immunized. People with an immunocompromised condition and certain other conditions should receive both PCV13 and PPSV23 vaccines. People with human immunodeficiency virus (HIV) infection should be immunized as soon as possible after diagnosis. Immunization during chemotherapy or radiation therapy should be avoided. Routine use of PPSV23 vaccine is not recommended for American Indians, [place] Natives, or people younger than 65 years unless there are medical conditions that require PPSV23 vaccine. When indicated, people who have unknown immunization and have no record of immunization should receive PPSV23 vaccine. One-time revaccination 5 years after the first dose of PPSV23 is recommended for people aged 19-64 years who have chronic kidney failure, nephrotic syndrome, asplenia, or immunocompromised conditions. People who received 1-2 doses of PPSV23 before age 65 years should receive another dose of PPSV23 vaccine at age 65 years or later if at least 5 years have passed since the previous dose. Doses of PPSV23 are not needed for people immunized with PPSV23 at or after age 65 years.  Meningococcal vaccine. Adults with asplenia or persistent complement component deficiencies should receive 2 doses of quadrivalent meningococcal conjugate (MenACWY-D) vaccine. The doses should be obtained at least 2 months apart. Microbiologists working with certain meningococcal bacteria,  military recruits, people at risk during an outbreak, and people who travel to or live in countries with a high rate of meningitis should be immunized. A first-year college student up through age 21 years who is living in a residence hall should receive a dose if he did not receive a dose on or after his 16th birthday. Adults who have certain high-risk conditions should receive one or more doses of vaccine.  Hepatitis A vaccine. Adults who wish to be protected from this disease, have certain high-risk conditions, work with hepatitis A-infected animals, work in hepatitis A research labs, or   travel to or work in countries with a high rate of hepatitis A should be immunized. Adults who were previously unvaccinated and who anticipate close contact with an international adoptee during the first 60 days after arrival in the [place] from a country with a high rate of hepatitis A should be immunized.  Hepatitis B vaccine. Adults should be immunized if they wish to be protected from this disease, have certain high-risk conditions, may be exposed to blood or other infectious body fluids, are household contacts or sex partners of hepatitis B positive people, are clients or workers in certain care facilities, or travel to or work in countries with a high rate of hepatitis B.  Haemophilus influenzae type b (Hib) vaccine. A previously unvaccinated person with asplenia or sickle cell disease or having a scheduled splenectomy should receive 1 dose of Hib vaccine. Regardless of previous immunization, a recipient of a hematopoietic stem cell transplant should receive a 3-dose series 6-12 months after his successful transplant. Hib vaccine is not recommended for adults with HIV infection. Preventive Service / Frequency Ages 3 to 39  Blood pressure check.** / Every 1 to 2 years.  Lipid and cholesterol check.** / Every 5 years beginning at age 31.  Hepatitis C blood test.** / For any individual with known risks for  hepatitis C.  Skin self-exam. / Monthly.  Influenza vaccine. / Every year.  Tetanus, diphtheria, and acellular pertussis (Tdap, Td) vaccine.** / Consult your health care provider. 1 dose of Td every 10 years.  Varicella vaccine.** / Consult your health care provider.  HPV vaccine. / 3 doses over 6 months, if 65 or younger.  Measles, mumps, rubella (MMR) vaccine.** / You need at least 1 dose of MMR if you were born in 1957 or later. You may also need a second dose.  Pneumococcal 13-valent conjugate (PCV13) vaccine.** / Consult your health care provider.  Pneumococcal polysaccharide (PPSV23) vaccine.** / 1 to 2 doses if you smoke cigarettes or if you have certain conditions.  Meningococcal vaccine.** / 1 dose if you are age 8 to 63 years and a Market researcher living in a residence hall, or have one of several medical conditions. You may also need additional booster doses.  Hepatitis A vaccine.** / Consult your health care provider.  Hepatitis B vaccine.** / Consult your health care provider.  Haemophilus influenzae type b (Hib) vaccine.** / Consult your health care provider. Ages 101 to 77  Blood pressure check.** / Every 1 to 2 years.  Lipid and cholesterol check.** / Every 5 years beginning at age 71.  Lung cancer screening. / Every year if you are aged 68-80 years and have a 30-pack-year history of smoking and currently smoke or have quit within the past 15 years. Yearly screening is stopped once you have quit smoking for at least 15 years or develop a health problem that would prevent you from having lung cancer treatment.  Fecal occult blood test (FOBT) of stool. / Every year beginning at age 3 and continuing until age 45. You may not have to do this test if you get a colonoscopy every 10 years.  Flexible sigmoidoscopy** or colonoscopy.** / Every 5 years for a flexible sigmoidoscopy or every 10 years for a colonoscopy beginning at age 3 and continuing until age  29.  Hepatitis C blood test.** / For all people born from [birth year] through 1965 and any individual with known risks for hepatitis C.  Skin self-exam. / Monthly.  Influenza vaccine. / Every  year.  Tetanus, diphtheria, and acellular pertussis (Tdap/Td) vaccine.** / Consult your health care provider. 1 dose of Td every 10 years.  Varicella vaccine.** / Consult your health care provider.  Zoster vaccine.** / 1 dose for adults aged 75 years or older.  Measles, mumps, rubella (MMR) vaccine.** / You need at least 1 dose of MMR if you were born in 1957 or later. You may also need a second dose.  Pneumococcal 13-valent conjugate (PCV13) vaccine.** / Consult your health care provider.  Pneumococcal polysaccharide (PPSV23) vaccine.** / 1 to 2 doses if you smoke cigarettes or if you have certain conditions.  Meningococcal vaccine.** / Consult your health care provider.  Hepatitis A vaccine.** / Consult your health care provider.  Hepatitis B vaccine.** / Consult your health care provider.  Haemophilus influenzae type b (Hib) vaccine.** / Consult your health care provider. Ages 35 and over  Blood pressure check.** / Every 1 to 2 years.  Lipid and cholesterol check.**/ Every 5 years beginning at age 58.  Lung cancer screening. / Every year if you are aged 1-80 years and have a 30-pack-year history of smoking and currently smoke or have quit within the past 15 years. Yearly screening is stopped once you have quit smoking for at least 15 years or develop a health problem that would prevent you from having lung cancer treatment.  Fecal occult blood test (FOBT) of stool. / Every year beginning at age 53 and continuing until age 56. You may not have to do this test if you get a colonoscopy every 10 years.  Flexible sigmoidoscopy** or colonoscopy.** / Every 5 years for a flexible sigmoidoscopy or every 10 years for a colonoscopy beginning at age 39 and continuing until age 65.  Hepatitis C blood  test.** / For all people born from [birth year] through 1965 and any individual with known risks for hepatitis C.  Abdominal aortic aneurysm (AAA) screening.** / A one-time screening for ages 15 to 78 years who are current or former smokers.  Skin self-exam. / Monthly.  Influenza vaccine. / Every year.  Tetanus, diphtheria, and acellular pertussis (Tdap/Td) vaccine.** / 1 dose of Td every 10 years.  Varicella vaccine.** / Consult your health care provider.  Zoster vaccine.** / 1 dose for adults aged 3 years or older.  Pneumococcal 13-valent conjugate (PCV13) vaccine.** / Consult your health care provider.  Pneumococcal polysaccharide (PPSV23) vaccine.** / 1 dose for all adults aged 38 years and older.  Meningococcal vaccine.** / Consult your health care provider.  Hepatitis A vaccine.** / Consult your health care provider.  Hepatitis B vaccine.** / Consult your health care provider.  Haemophilus influenzae type b (Hib) vaccine.** / Consult your health care provider. **Family history and personal history of risk and conditions may change your health care provider's recommendations. Document Released: 08/23/2001 Document Revised: 07/02/2013 Document Reviewed: 11/22/2010 [facility] Patient Information 2015 [place], [place]. This information is not intended to replace advice given to you by your health care provider. Make sure you discuss any questions you have with your health care provider.

## 2015-02-12 NOTE — Assessment & Plan Note (Signed)
[facility] reviewed with pt

## 2015-02-12 NOTE — Progress Notes (Deleted)
Patient ID: Dennis Moore, male    DOB: 08/24/1955  Age: 59 y.o. MRN: [id number]    Subjective:  Subjective HPI Dennis Moore presents for cpe and labs.    No new complaints.    Review of Systems  Constitutional: Negative.   HENT: Negative for congestion, ear pain, hearing loss, nosebleeds, postnasal drip, rhinorrhea, sinus pressure, sneezing and tinnitus.   Eyes: Negative for photophobia, discharge, itching and visual disturbance.  Respiratory: Negative.   Cardiovascular: Negative.   Gastrointestinal: Negative for abdominal pain, constipation, blood in stool, abdominal distention and anal bleeding.  Endocrine: Negative.   Genitourinary: Negative.   Musculoskeletal: Negative.   Skin: Negative.   Allergic/Immunologic: Negative.   Neurological: Negative for dizziness, weakness, light-headedness, numbness and headaches.  Psychiatric/Behavioral: Negative for suicidal ideas, confusion, sleep disturbance, dysphoric mood, decreased concentration and agitation. The patient is not nervous/anxious.     History Past Medical History  Diagnosis Date  . Depression   . Hyperlipidemia   . Hypertension   . Erectile dysfunction   . Tubular adenoma of colon 11/2008  . Internal hemorrhoids   . BPH (benign prostatic hypertrophy)   . Allergic rhinitis     He has past surgical history that includes Appendectomy; Elbow surgery; Carpal tunnel release; and Inguinal hernia repair.   His family history includes Alzheimer's disease in his paternal uncle; Colon cancer in his maternal uncle; Heart disease in his father and mother; Hyperlipidemia in his father; Hypertension in his father; Parkinsonism in his father.He reports that he has never smoked. He has never used smokeless tobacco. He reports that he drinks alcohol. He reports that he does not use illicit drugs.  Current Outpatient Prescriptions on File Prior to Visit  Medication Sig Dispense Refill  . albuterol (PROVENTIL HFA;VENTOLIN HFA) 108  (90 BASE) MCG/ACT inhaler Inhale 2 puffs into the lungs every 6 (six) hours as needed for wheezing. 1 Inhaler 5  . beclomethasone (QVAR) 40 MCG/ACT inhaler Inhale 2 puffs into the lungs 2 (two) times daily. 1 Inhaler 12  . butalbital-acetaminophen-caffeine (FIORICET, ESGIC) 50-325-40 MG per tablet Take 1 tablet by mouth every 6 (six) hours as needed. 30 tablet 0  . celecoxib (CELEBREX) 200 MG capsule Take 1 capsule (200 mg total) by mouth daily as needed. 30 capsule 0  . cyclobenzaprine (FLEXERIL) 10 MG tablet Take 1 tablet (10 mg total) by mouth 3 (three) times daily as needed. 30 tablet 0  . diazepam (VALIUM) 5 MG tablet Take 1 tablet (5 mg total) by mouth every 8 (eight) hours as needed for muscle spasms. 10 tablet 0  . ibuprofen (ADVIL,MOTRIN) 800 MG tablet Take 1 tablet (800 mg total) by mouth every 8 (eight) hours as needed for mild pain. 30 tablet 0  . lisinopril-hydrochlorothiazide (PRINZIDE,ZESTORETIC) 20-12.5 MG per tablet Take 2 tablets by mouth daily. 60 tablet 11  . oxyCODONE-acetaminophen (PERCOCET/ROXICET) 5-325 MG per tablet Take 1 tablet by mouth every 4 (four) hours as needed. 30 tablet 0  . rosuvastatin (CRESTOR) 20 MG tablet Take 1 tablet (20 mg total) by mouth daily. 90 tablet 1  . sildenafil (VIAGRA) 100 MG tablet Take 0.5-1 tablets (50-100 mg total) by mouth daily as needed for erectile dysfunction. 10 tablet 0   No current facility-administered medications on file prior to visit.     Objective:  Objective Physical Exam  Constitutional: He is oriented to person, place, and time. He appears well-developed and well-nourished. No distress.  HENT:  Head: Normocephalic and atraumatic.  Right Ear: External ear  normal.  Left Ear: External ear normal.  Nose: Nose normal.  Mouth/Throat: Oropharynx is clear and moist. No oropharyngeal exudate.  Eyes: Conjunctivae and EOM are normal. Pupils are equal, round, and reactive to light. Right eye exhibits no discharge. Left eye  exhibits no discharge.  Neck: Normal range of motion. Neck supple. No JVD present. No thyromegaly present.  Cardiovascular: Normal rate, regular rhythm and intact distal pulses.  Exam reveals no gallop and no friction rub.   No murmur heard. Pulmonary/Chest: Effort normal and breath sounds normal. No respiratory distress. He has no wheezes. He has no rales. He exhibits no tenderness.  Abdominal: Soft. Bowel sounds are normal. He exhibits no distension and no mass. There is no tenderness. There is no rebound and no guarding.  Genitourinary: Prostate normal and penis normal. Guaiac positive stool.  Musculoskeletal: Normal range of motion. He exhibits no edema or tenderness.  Lymphadenopathy:    He has no cervical adenopathy.  Neurological: He is alert and oriented to person, place, and time. He displays normal reflexes. He exhibits normal muscle tone.  Skin: Skin is warm and dry. No rash noted. He is not diaphoretic. No erythema. No pallor.  Psychiatric: He has a normal mood and affect. His behavior is normal. Judgment and thought content normal.   BP 118/66 mmHg  Pulse 59  Temp(Src) 97.8 F (36.6 C) (Oral)  Ht 6' (1.829 m)  Wt 218 lb 9.6 oz (99.156 kg)  BMI 29.64 kg/m2  SpO2 98% Wt Readings from Last 3 Encounters:  02/12/15 218 lb 9.6 oz (99.156 kg)  02/09/15 219 lb 9.6 oz (99.61 kg)  08/14/14 214 lb 3.2 oz (97.16 kg)     Lab Results  Component Value Date   WBC 8.4 02/09/2015   HGB 12.8* 02/09/2015   HCT 37.7* 02/09/2015   PLT 300.0 02/09/2015   GLUCOSE 78 02/09/2015   CHOL 182 02/09/2015   TRIG 327.0* 02/09/2015   HDL 57.80 02/09/2015   LDLDIRECT 41.0 02/09/2015   LDLCALC 67 10/12/2010   ALT 42 02/09/2015   AST 30 02/09/2015   NA 140 02/09/2015   K 3.5 02/09/2015   CL 102 02/09/2015   CREATININE 1.20 02/09/2015   BUN 20 02/09/2015   CO2 27 02/09/2015   TSH 2.48 02/09/2015   PSA 0.56 02/09/2015   HGBA1C 5.5 12/11/2012   MICROALBUR <0.7 02/11/2015    Dg Thoracic  Spine 2 View  08/15/2014   CLINICAL DATA:  Persistent upper back pain since a fall 1 month ago.  EXAM: THORACIC SPINE - 2 VIEW  COMPARISON:  Chest x-ray of February 10, 2014.  FINDINGS: The thoracic vertebral bodies are preserved in height. The intervertebral disc space heights are reasonably well maintained. The pedicles and transverse processes are intact. There are no abnormal paravertebral soft tissue densities. There is minimal curvature of the upper thoracic spine which is likely positional.  IMPRESSION: There is no acute or significant chronic bony abnormality of the thoracic spine.   Electronically Signed   By: David  Martinique   On: 08/15/2014 07:36     Assessment & Plan:  Plan I have discontinued Mr. Bracewell's azithromycin and Fenofibrate Micronized. I am also having him start on Fenofibric Acid. Additionally, I am having him maintain his diazepam, butalbital-acetaminophen-caffeine, ibuprofen, oxyCODONE-acetaminophen, albuterol, beclomethasone, celecoxib, cyclobenzaprine, lisinopril-hydrochlorothiazide, rosuvastatin, and sildenafil.  Meds ordered this encounter  Medications  . Fenofibric Acid 105 MG TABS    Sig: 1 po qd    Dispense:  90 tablet  Refill:  1    Problem List Items Addressed This Visit    Preventative health care - Primary    Other Visit Diagnoses    Hypertriglyceridemia, essential        Relevant Medications    Fenofibric Acid 105 MG TABS    Essential hypertension        Relevant Medications    Fenofibric Acid 105 MG TABS    Hyperlipidemia        Relevant Medications    Fenofibric Acid 105 MG TABS    Heme + stool        Relevant Orders    Ambulatory referral to Gastroenterology    Anemia due to other cause        Relevant Orders    Ambulatory referral to Gastroenterology       Follow-up: Return in about 6 months (around 08/15/2015), or if symptoms worsen or fail to improve, for hypertension, hyperlipidemia.  Garnet Koyanagi, DO

## 2015-02-13 ENCOUNTER — Telehealth: Payer: Self-pay | Admitting: Family Medicine

## 2015-02-13 MED ORDER — FENOFIBRATE 48 MG PO TABS: 48.0000 mg | ORAL_TABLET | Freq: Two times a day (BID) | ORAL | Status: DC

## 2015-02-13 NOTE — Telephone Encounter (Signed)
That is fine 

## 2015-02-13 NOTE — Telephone Encounter (Signed)
Caller name:Meagan- Relation to IT:GPQD Call back [phone] Pharmacy:[facility]  Reason for call: pt was given rx Fenofibric Acid 105 MG TABS, this rx is not on the formulary, pharmacy wants to know if it can be changed to 48 mg where the pt would take 2 a day.

## 2015-02-13 NOTE — Telephone Encounter (Signed)
Rx faxed.    KP 

## 2015-02-13 NOTE — Telephone Encounter (Signed)
Please advise      KP 

## 2015-04-17 ENCOUNTER — Ambulatory Visit (INDEPENDENT_AMBULATORY_CARE_PROVIDER_SITE_OTHER): Payer: Self-pay | Admitting: Internal Medicine

## 2015-04-17 ENCOUNTER — Encounter: Payer: Self-pay | Admitting: Internal Medicine

## 2015-04-17 ENCOUNTER — Other Ambulatory Visit: Payer: Self-pay | Admitting: Internal Medicine

## 2015-04-17 VITALS — BP 118/64 | HR 73 | Temp 98.3°F | Ht 72.0 in | Wt 225.0 lb

## 2015-04-17 DIAGNOSIS — Z202 Contact with and (suspected) exposure to infections with a predominantly sexual mode of transmission: Secondary | ICD-10-CM

## 2015-04-17 MED ORDER — METRONIDAZOLE 500 MG PO TABS: 500.0000 mg | ORAL_TABLET | Freq: Two times a day (BID) | ORAL | Status: DC

## 2015-04-17 NOTE — Progress Notes (Signed)
Pre visit review using our clinic review tool, if applicable. No additional management support is needed unless otherwise documented below in the visit note. 

## 2015-04-17 NOTE — Patient Instructions (Signed)
Get your blood work before you leave   Take antibiotics as prescribed for one week   Next visit  for a 4-6 months for a checkup. Please schedule an appointment at the front desk     Safe Sex Safe sex is about reducing the risk of giving or getting a sexually transmitted disease (STD). STDs are spread through sexual contact involving the genitals, mouth, or rectum. Some STDs can be cured and others cannot. Safe sex can also prevent unintended pregnancies.  WHAT ARE SOME SAFE SEX PRACTICES?  Limit your sexual activity to only one partner who is having sex with only you.  Talk to your partner about his or her past partners, past STDs, and drug use.  Use a condom every time you have sexual intercourse. This includes vaginal, oral, and anal sexual activity. Both females and males should wear condoms during oral sex. Only use latex or polyurethane condoms and water-based lubricants. Using petroleum-based lubricants or oils to lubricate a condom will weaken the condom and increase the chance that it will break. The condom should be in place from the beginning to the end of sexual activity. Wearing a condom reduces, but does not completely eliminate, your risk of getting or giving an STD. STDs can be spread by contact with infected body fluids and skin.  Get vaccinated for hepatitis B and HPV.  Avoid alcohol and recreational drugs, which can affect your judgment. You may forget to use a condom or participate in high-risk sex.  For females, avoid douching after sexual intercourse. Douching can spread an infection farther into the reproductive tract.  Check your body for signs of sores, blisters, rashes, or unusual discharge. See your health care provider if you notice any of these signs.  Avoid sexual contact if you have symptoms of an infection or are being treated for an STD. If you or your partner has herpes, avoid sexual contact when blisters are present. Use condoms at all other times.  If  you are at risk of being infected with HIV, it is recommended that you take a prescription medicine daily to prevent HIV infection. This is called pre-exposure prophylaxis (PrEP). You are considered at risk if:  You are a man who has sex with other men (MSM).  You are a heterosexual man or woman who is sexually active with more than one partner.  You take drugs by injection.  You are sexually active with a partner who has HIV.  Talk with your health care provider about whether you are at high risk of being infected with HIV. If you choose to begin PrEP, you should first be tested for HIV. You should then be tested every 3 months for as long as you are taking PrEP.  See your health care provider for regular screenings, exams, and tests for other STDs. Before having sex with a new partner, each of you should be screened for STDs and should talk about the results with each other. WHAT ARE THE BENEFITS OF SAFE SEX?   There is less chance of getting or giving an STD.  You can prevent unwanted or unintended pregnancies.  By discussing safe sex concerns with your partner, you may increase feelings of intimacy, comfort, trust, and honesty between the two of you.   This information is not intended to replace advice given to you by your health care provider. Make sure you discuss any questions you have with your health care provider.   Document Released: 08/04/2004 Document Revised: 07/18/2014 Document  Reviewed: 12/19/2011 Elsevier Interactive Patient Education [employer].

## 2015-04-17 NOTE — Progress Notes (Signed)
Subjective:    Patient ID: Dennis Moore, male    DOB: January 12, 1956, 59 y.o.   MRN: [id number]  DOS:  04/17/2015 Type of visit - description : acute  Interval history: Patient was recently exposed to Trichomonas, his male partner  is currently being treated.    Review of Systems Denies any symptoms, no discharge, rash, dysuria. He is quite distressed emotionally  Past Medical History  Diagnosis Date  . Depression   . Hyperlipidemia   . Hypertension   . Erectile dysfunction   . Tubular adenoma of colon 11/2008  . Internal hemorrhoids   . BPH (benign prostatic hypertrophy)   . Allergic rhinitis     Past Surgical History  Procedure Laterality Date  . Appendectomy    . Elbow surgery      right  . Carpal tunnel release      right  . Inguinal hernia repair      age 38    Social History   Social History  . Marital Status: Married    Spouse Name: N/A  . Number of Children: 6  . Years of Education: N/A   Occupational History  . disabled    Social History Main Topics  . Smoking status: Never Smoker   . Smokeless tobacco: Never Used  . Alcohol Use: Yes  . Drug Use: No  . Sexual Activity: Not on file   Other Topics Concern  . Not on file   Social History Narrative        Medication List       This list is accurate as of: 04/17/15 11:59 PM.  Always use your most recent med list.               albuterol 108 (90 BASE) MCG/ACT inhaler  Commonly known as:  PROVENTIL HFA;VENTOLIN HFA  Inhale 2 puffs into the lungs every 6 (six) hours as needed for wheezing.     beclomethasone 40 MCG/ACT inhaler  Commonly known as:  QVAR  Inhale 2 puffs into the lungs 2 (two) times daily.     butalbital-acetaminophen-caffeine 50-325-40 MG tablet  Commonly known as:  FIORICET, ESGIC  Take 1 tablet by mouth every 6 (six) hours as needed.     celecoxib 200 MG capsule  Commonly known as:  CELEBREX  Take 1 capsule (200 mg total) by mouth daily as needed.     cyclobenzaprine 10 MG tablet  Commonly known as:  FLEXERIL  Take 1 tablet (10 mg total) by mouth 3 (three) times daily as needed.     diazepam 5 MG tablet  Commonly known as:  VALIUM  Take 1 tablet (5 mg total) by mouth every 8 (eight) hours as needed for muscle spasms.     fenofibrate 48 MG tablet  Commonly known as:  TRICOR  Take 1 tablet (48 mg total) by mouth 2 (two) times daily.     ibuprofen 800 MG tablet  Commonly known as:  ADVIL,MOTRIN  Take 1 tablet (800 mg total) by mouth every 8 (eight) hours as needed for mild pain.     lisinopril-hydrochlorothiazide 20-12.5 MG tablet  Commonly known as:  PRINZIDE,ZESTORETIC  Take 2 tablets by mouth daily.     metroNIDAZOLE 500 MG tablet  Commonly known as:  FLAGYL  Take 1 tablet (500 mg total) by mouth 2 (two) times daily.     oxyCODONE-acetaminophen 5-325 MG tablet  Commonly known as:  PERCOCET/ROXICET  Take 1 tablet by mouth every 4 (four) hours  as needed.     rosuvastatin 20 MG tablet  Commonly known as:  CRESTOR  Take 1 tablet (20 mg total) by mouth daily.     sildenafil 100 MG tablet  Commonly known as:  VIAGRA  Take 0.5-1 tablets (50-100 mg total) by mouth daily as needed for erectile dysfunction.           Objective:   Physical Exam BP 118/64 mmHg  Pulse 73  Temp(Src) 98.3 F (36.8 C) (Oral)  Ht 6' (1.829 m)  Wt 225 lb (102.059 kg)  BMI 30.51 kg/m2  SpO2 98% General:   Well developed, well nourished . NAD.  HEENT:  Normocephalic . Face symmetric, atraumatic GU: Scrotal contents normal, penis normal inspection on palpation, no rash or discharge. no inguinal LADs. Neurologic:  alert & oriented X3.  Speech normal, gait appropriate for age and unassisted Psych--  Cognition and judgment appear intact.  Cooperative with normal attention span and concentration.  Behavior appropriate. Slightly anxious appearing     Assessment & Plan:   STD exposure: Labs Empiric treatment with Flagyl Safe sex  discussed Patient is extensively counseled

## 2015-04-18 LAB — RPR

## 2015-04-18 LAB — HEPATITIS C ANTIBODY: HCV AB: NEGATIVE

## 2015-04-18 LAB — GC/CHLAMYDIA PROBE AMP, URINE
Chlamydia, Swab/Urine, PCR: NEGATIVE
GC Probe Amp, Urine: NEGATIVE

## 2015-04-18 LAB — HIV ANTIBODY (ROUTINE TESTING W REFLEX): HIV 1&2 Ab, 4th Generation: NONREACTIVE

## 2015-04-20 ENCOUNTER — Telehealth: Payer: Self-pay | Admitting: *Deleted

## 2015-04-20 ENCOUNTER — Encounter: Payer: Self-pay | Admitting: Internal Medicine

## 2015-04-20 NOTE — Telephone Encounter (Signed)
Called and spoke with [place] with [facility] to see if we could have a Trichomoniasis run of the urine that was sent in for GC/Chlamydia.  Test for Trichomoniasis was added and Dx:STD exposure.//AB/CMA

## 2015-04-21 LAB — TRICHOMONAS VAGINALIS, PROBE AMP: T vaginalis RNA: NEGATIVE

## 2015-06-30 ENCOUNTER — Encounter: Payer: Self-pay | Admitting: Family Medicine

## 2015-06-30 DIAGNOSIS — Z87438 Personal history of other diseases of male genital organs: Secondary | ICD-10-CM

## 2015-06-30 DIAGNOSIS — R350 Frequency of micturition: Secondary | ICD-10-CM

## 2015-06-30 DIAGNOSIS — N400 Enlarged prostate without lower urinary tract symptoms: Secondary | ICD-10-CM

## 2015-06-30 NOTE — Telephone Encounter (Signed)
Ok to refer to urology at [facility]

## 2015-07-01 DIAGNOSIS — J4530 Mild persistent asthma, uncomplicated: Secondary | ICD-10-CM | POA: Insufficient documentation

## 2015-07-01 DIAGNOSIS — M159 Polyosteoarthritis, unspecified: Secondary | ICD-10-CM | POA: Insufficient documentation

## 2015-07-01 DIAGNOSIS — E785 Hyperlipidemia, unspecified: Secondary | ICD-10-CM | POA: Insufficient documentation

## 2015-08-03 DIAGNOSIS — E291 Testicular hypofunction: Secondary | ICD-10-CM | POA: Insufficient documentation

## 2015-08-17 ENCOUNTER — Telehealth: Payer: Self-pay | Admitting: Family Medicine

## 2015-08-17 ENCOUNTER — Encounter: Payer: Self-pay | Admitting: Family Medicine

## 2015-08-17 ENCOUNTER — Ambulatory Visit: Payer: Medicare Other | Admitting: Family Medicine

## 2015-08-17 ENCOUNTER — Ambulatory Visit (INDEPENDENT_AMBULATORY_CARE_PROVIDER_SITE_OTHER): Payer: Self-pay | Admitting: Family Medicine

## 2015-08-17 VITALS — BP 139/71 | HR 60 | Temp 98.1°F | Ht 71.0 in | Wt 223.0 lb

## 2015-08-17 DIAGNOSIS — M1990 Unspecified osteoarthritis, unspecified site: Secondary | ICD-10-CM

## 2015-08-17 DIAGNOSIS — I10 Essential (primary) hypertension: Secondary | ICD-10-CM

## 2015-08-17 DIAGNOSIS — E785 Hyperlipidemia, unspecified: Secondary | ICD-10-CM

## 2015-08-17 DIAGNOSIS — M1610 Unilateral primary osteoarthritis, unspecified hip: Secondary | ICD-10-CM

## 2015-08-17 DIAGNOSIS — N521 Erectile dysfunction due to diseases classified elsewhere: Secondary | ICD-10-CM

## 2015-08-17 DIAGNOSIS — J209 Acute bronchitis, unspecified: Secondary | ICD-10-CM

## 2015-08-17 DIAGNOSIS — Z125 Encounter for screening for malignant neoplasm of prostate: Secondary | ICD-10-CM

## 2015-08-17 MED ORDER — CELECOXIB 200 MG PO CAPS: 200.0000 mg | ORAL_CAPSULE | Freq: Every day | ORAL | Status: DC | PRN

## 2015-08-17 MED ORDER — ALBUTEROL SULFATE HFA 108 (90 BASE) MCG/ACT IN AERS: 2.0000 | INHALATION_SPRAY | Freq: Four times a day (QID) | RESPIRATORY_TRACT | Status: DC | PRN

## 2015-08-17 MED ORDER — ROSUVASTATIN CALCIUM 20 MG PO TABS: 20.0000 mg | ORAL_TABLET | Freq: Every day | ORAL | Status: DC

## 2015-08-17 MED ORDER — SILDENAFIL CITRATE 100 MG PO TABS: 50.0000 mg | ORAL_TABLET | Freq: Every day | ORAL | Status: AC | PRN

## 2015-08-17 MED ORDER — LISINOPRIL-HYDROCHLOROTHIAZIDE 20-12.5 MG PO TABS: 2.0000 | ORAL_TABLET | Freq: Every day | ORAL | Status: DC

## 2015-08-17 MED ORDER — BECLOMETHASONE DIPROPIONATE 40 MCG/ACT IN AERS: 2.0000 | INHALATION_SPRAY | Freq: Two times a day (BID) | RESPIRATORY_TRACT | Status: DC

## 2015-08-17 NOTE — Patient Instructions (Signed)
[facility] and [facility] may be able to get you Viagra I am discontinuing the medication for your trigylcerides for now. Will consider where to restart later. If pharmacy unable to provide the medications I have refilled, have them let me know so I can prescribe a substitute.  Follow-up in six months Come in Monday fasting for bloodwork.

## 2015-08-17 NOTE — Progress Notes (Signed)
Patient ID: Dennis Moore, male   DOB: Aug 04, 1955, 60 y.o.   MRN: [id number]   Dennis Moore, is a 60 y.o. male  [id number]  [id number]  DOB - Jun 09, 1956  CC:  Chief Complaint  Patient presents with  . new patient/get established       HPI: Dennis Moore is a 60 y.o. male here to establish care. He has previously been a patient at [facility] and is transferring here because their pharmacy closed and he is unable to afford his medications. He has a history of hypercholesterolemia and hypertriglycerides and has been on both Crestor and Tricor. He is taking lisinopril/hctz  20/12.5 2 bid for hypertension, Celebrex for musculoskeletal pain, Qvar and albuterol for asthma symptoms and Viagra for ED.  He is requesting refills on these medications be sent to [facility]. He denies tobacco, alcohol and illicit drug use. He reports having a flu shot elsewhere for this season. He asks about the Zostivax and I have informed him he may be able to get this less expensively at a pharmacy. I have also recommended pneumococcal vaccine.   Allergies  Allergen Reactions  . Penicillins Swelling and Rash   Past Medical History  Diagnosis Date  . Depression   . Hyperlipidemia   . Hypertension   . Erectile dysfunction   . Tubular adenoma of colon 11/2008  . Internal hemorrhoids   . BPH (benign prostatic hypertrophy)   . Allergic rhinitis    Current Outpatient Prescriptions on File Prior to Visit  Medication Sig Dispense Refill  . diazepam (VALIUM) 5 MG tablet Take 1 tablet (5 mg total) by mouth every 8 (eight) hours as needed for muscle spasms. 10 tablet 0  . butalbital-acetaminophen-caffeine (FIORICET, ESGIC) 50-325-40 MG per tablet Take 1 tablet by mouth every 6 (six) hours as needed. (Patient not taking: Reported on 04/17/2015) 30 tablet 0  . cyclobenzaprine (FLEXERIL) 10 MG tablet Take 1 tablet (10 mg total) by mouth 3 (three) times daily as needed. (Patient not taking:  Reported on 04/17/2015) 30 tablet 0  . ibuprofen (ADVIL,MOTRIN) 800 MG tablet Take 1 tablet (800 mg total) by mouth every 8 (eight) hours as needed for mild pain. (Patient not taking: Reported on 04/17/2015) 30 tablet 0  . metroNIDAZOLE (FLAGYL) 500 MG tablet Take 1 tablet (500 mg total) by mouth 2 (two) times daily. (Patient not taking: Reported on 08/17/2015) 14 tablet 0  . oxyCODONE-acetaminophen (PERCOCET/ROXICET) 5-325 MG per tablet Take 1 tablet by mouth every 4 (four) hours as needed. (Patient not taking: Reported on 04/17/2015) 30 tablet 0   No current facility-administered medications on file prior to visit.   Family History  Problem Relation Age of Onset  . Alzheimer's disease Paternal Uncle   . Parkinsonism Father   . Hyperlipidemia Father   . Hypertension Father   . Colon cancer Maternal Uncle   . Heart disease Father   . Heart disease Mother    Social History   Social History  . Marital Status: Married    Spouse Name: N/A  . Number of Children: 6  . Years of Education: N/A   Occupational History  . disabled    Social History Main Topics  . Smoking status: Never Smoker   . Smokeless tobacco: Never Used  . Alcohol Use: Yes  . Drug Use: No  . Sexual Activity: Not on file   Other Topics Concern  . Not on file   Social History Narrative    Review of  Systems: Constitutional: Negative for fever, chills, appetite change, weight loss,  Fatigue. Skin: Negative for rashes or lesions of concern. HENT: Negative for ear pain, ear discharge.nose bleeds Eyes: Negative for pain, discharge, redness, itching and visual disturbance. Neck: Negative for pain, stiffness Respiratory: Negative for cough, shortness of breath,   Cardiovascular: Negative for chest pain, palpitations and leg swelling. Gastrointestinal: Negative for abdominal pain, nausea, vomiting, diarrhea, constipations Genitourinary: Negative for dysuria, urgency, frequency, hematuria,  Musculoskeletal: Negative for  back pain, joint pain, joint  swelling, and gait problem.Negative for weakness. Neurological: Negative for dizziness, tremors, seizures, syncope,   light-headedness, numbness and headaches.  Hematological: Negative for easy bruising or bleeding Psychiatric/Behavioral: Negative for depression, anxiety, decreased concentration, confusion   Objective:   Filed Vitals:   08/17/15 1412  BP: 139/71  Pulse: 60  Temp: 98.1 F (36.7 C)    Physical Exam: Constitutional: Patient appears well-developed and well-nourished. No distress. HENT: Normocephalic, atraumatic, External right and left ear normal. Oropharynx is clear and moist.  Eyes: Conjunctivae and EOM are normal. PERRLA, no scleral icterus. Neck: Normal ROM. Neck supple. No lymphadenopathy, No thyromegaly. CVS: RRR, S1/S2 +, no murmurs, no gallops, no rubs Pulmonary: Effort and breath sounds normal, no stridor, rhonchi, wheezes, rales.  Abdominal: Soft. Normoactive BS,, no distension, tenderness, rebound or guarding.  Musculoskeletal: Normal range of motion. No edema and no tenderness.  Neuro: Alert.Normal muscle tone coordination. Non-focal Skin: Skin is warm and dry. No rash noted. Not diaphoretic. No erythema. No pallor. Psychiatric: Normal mood and affect. Behavior, judgment, thought content normal.  Lab Results  Component Value Date   WBC 8.4 02/09/2015   HGB 12.8* 02/09/2015   HCT 37.7* 02/09/2015   MCV 90.3 02/09/2015   PLT 300.0 02/09/2015   Lab Results  Component Value Date   CREATININE 1.20 02/09/2015   BUN 20 02/09/2015   NA 140 02/09/2015   K 3.5 02/09/2015   CL 102 02/09/2015   CO2 27 02/09/2015    Lab Results  Component Value Date   HGBA1C 5.5 12/11/2012   Lipid Panel     Component Value Date/Time   CHOL 182 02/09/2015 1641   TRIG 327.0* 02/09/2015 1641   HDL 57.80 02/09/2015 1641   CHOLHDL 3 02/09/2015 1641   VLDL 65.4* 02/09/2015 1641   LDLCALC 67 10/12/2010 0959       Assessment and plan:    1. Acute bronchitis, unspecified organism  - beclomethasone (QVAR) 40 MCG/ACT inhaler; Inhale 2 puffs into the lungs 2 (two) times daily.  Dispense: 1 Inhaler; Refill: 12 - albuterol (PROVENTIL HFA;VENTOLIN HFA) 108 (90 Base) MCG/ACT inhaler; Inhale 2 puffs into the lungs every 6 (six) hours as needed for wheezing.  Dispense: 1 Inhaler; Refill: 5  2. Essential hypertension  - lisinopril-hydrochlorothiazide (PRINZIDE,ZESTORETIC) 20-12.5 MG tablet; Take 2 tablets by mouth daily.  Dispense: 60 tablet; Refill: 11 - COMPLETE METABOLIC PANEL WITH GFR; Future - CBC w/Diff; Future  - TSH; Future  3. Hyperlipidemia - Lipid panel; Future - rosuvastatin (CRESTOR) 20 MG tablet; Take 1 tablet (20 mg total) by mouth daily.  Dispense: 90 tablet; Refill: 1  4. Hip arthritis - celecoxib (CELEBREX) 200 MG capsule; Take 1 capsule (200 mg total) by mouth daily as needed.  Dispense: 30 capsule; Refill: 11  5. Erectile dysfunction due to diseases classified elsewhere  - sildenafil (VIAGRA) 100 MG tablet; Take 0.5 tablets (50 mg total) by mouth daily as needed for erectile dysfunction.  Dispense: 10 tablet; Refill: 0  6.  Prostate cancer screening  - PSA, Medicare   Return in about 6 months (around 02/14/2016) for HTN.  The patient was given clear instructions to go to ER or return to medical center if symptoms don't improve, worsen or new problems develop. The patient verbalized understanding.    Micheline Chapman FNP  08/17/2015, 3:48 PM

## 2015-08-17 NOTE — Telephone Encounter (Signed)
No charge. 
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
## 2015-08-24 ENCOUNTER — Other Ambulatory Visit: Payer: Medicare Other

## 2015-08-25 ENCOUNTER — Encounter: Payer: Self-pay | Admitting: Family Medicine

## 2015-08-25 ENCOUNTER — Ambulatory Visit (INDEPENDENT_AMBULATORY_CARE_PROVIDER_SITE_OTHER): Payer: Medicare Other | Admitting: Family Medicine

## 2015-08-25 VITALS — BP 122/76 | HR 56 | Temp 97.5°F | Ht 71.0 in | Wt 227.4 lb

## 2015-08-25 DIAGNOSIS — N529 Male erectile dysfunction, unspecified: Secondary | ICD-10-CM

## 2015-08-25 DIAGNOSIS — E785 Hyperlipidemia, unspecified: Secondary | ICD-10-CM

## 2015-08-25 DIAGNOSIS — R319 Hematuria, unspecified: Secondary | ICD-10-CM

## 2015-08-25 DIAGNOSIS — Z23 Encounter for immunization: Secondary | ICD-10-CM

## 2015-08-25 DIAGNOSIS — I10 Essential (primary) hypertension: Secondary | ICD-10-CM

## 2015-08-25 LAB — COMPREHENSIVE METABOLIC PANEL
ALT: 73 U/L — ABNORMAL HIGH (ref 0–53)
AST: 48 U/L — AB (ref 0–37)
Albumin: 4.4 g/dL (ref 3.5–5.2)
Alkaline Phosphatase: 48 U/L (ref 39–117)
BUN: 16 mg/dL (ref 6–23)
CHLORIDE: 105 meq/L (ref 96–112)
CO2: 27 meq/L (ref 19–32)
CREATININE: 1.19 mg/dL (ref 0.40–1.50)
Calcium: 9.5 mg/dL (ref 8.4–10.5)
GFR: 80.12 mL/min (ref >60.00)
GLUCOSE: 106 mg/dL — AB (ref 70–99)
POTASSIUM: 3.5 meq/L (ref 3.5–5.1)
SODIUM: 141 meq/L (ref 135–145)
Total Bilirubin: 0.6 mg/dL (ref 0.2–1.2)
Total Protein: 7.7 g/dL (ref 6.0–8.3)

## 2015-08-25 LAB — LIPID PANEL
CHOL/HDL RATIO: 5
CHOLESTEROL: 251 mg/dL — AB (ref 0–200)
HDL: 54.2 mg/dL (ref >39.00)
NONHDL: 197.02
TRIGLYCERIDES: 370 mg/dL — AB (ref 0.0–149.0)
VLDL: 74 mg/dL — ABNORMAL HIGH (ref 0.0–40.0)

## 2015-08-25 LAB — POCT URINALYSIS DIPSTICK
BILIRUBIN UA: NEGATIVE
Glucose, UA: NEGATIVE
Ketones, UA: NEGATIVE
LEUKOCYTES UA: NEGATIVE
NITRITE UA: NEGATIVE
PH UA: 6
Protein, UA: NEGATIVE
Spec Grav, UA: >=1.03
UROBILINOGEN UA: 0.2

## 2015-08-25 LAB — CBC WITH DIFFERENTIAL/PLATELET
BASOS PCT: 0.8 % (ref 0.0–3.0)
Basophils Absolute: 0.1 10*3/uL (ref 0.0–0.1)
EOS ABS: 0.3 10*3/uL (ref 0.0–0.7)
EOS PCT: 3.1 % (ref 0.0–5.0)
HEMATOCRIT: 39.5 % (ref 39.0–52.0)
HEMOGLOBIN: 13.4 g/dL (ref 13.0–17.0)
Lymphocytes Relative: 37.5 % (ref 12.0–46.0)
Lymphs Abs: 3.2 10*3/uL (ref 0.7–4.0)
MCHC: 33.8 g/dL (ref 30.0–36.0)
MCV: 89.7 fl (ref 78.0–100.0)
MONO ABS: 0.6 10*3/uL (ref 0.1–1.0)
Monocytes Relative: 6.6 % (ref 3.0–12.0)
NEUTROS ABS: 4.4 10*3/uL (ref 1.4–7.7)
Neutrophils Relative %: 52 % (ref 43.0–77.0)
PLATELETS: 296 10*3/uL (ref 150.0–400.0)
RBC: 4.41 Mil/uL (ref 4.22–5.81)
RDW: 15.5 % (ref 11.5–15.5)
WBC: 8.5 10*3/uL (ref 4.0–10.5)

## 2015-08-25 LAB — LDL CHOLESTEROL, DIRECT: LDL DIRECT: 67 mg/dL

## 2015-08-25 LAB — TSH: TSH: 2.06 u[IU]/mL (ref 0.35–4.50)

## 2015-08-25 NOTE — Progress Notes (Signed)
Pre visit review using our clinic review tool, if applicable. No additional management support is needed unless otherwise documented below in the visit note. 

## 2015-08-25 NOTE — Patient Instructions (Signed)

## 2015-08-26 LAB — URINE CULTURE
COLONY COUNT: NO GROWTH
ORGANISM ID, BACTERIA: NO GROWTH

## 2015-08-27 NOTE — Progress Notes (Signed)
Patient ID: Dennis Moore, male    DOB: December 19, 1955  Age: 60 y.o. MRN: [id number]    Subjective:  Subjective HPI Dennis Moore presents for f/u cholesterol and htn.  No other complaints.    Review of Systems  Constitutional: Negative for diaphoresis, appetite change, fatigue and unexpected weight change.  Eyes: Negative for pain, redness and visual disturbance.  Respiratory: Negative for cough, chest tightness, shortness of breath and wheezing.   Cardiovascular: Negative for chest pain, palpitations and leg swelling.  Endocrine: Negative for cold intolerance, heat intolerance, polydipsia, polyphagia and polyuria.  Genitourinary: Negative for dysuria, frequency and difficulty urinating.  Neurological: Negative for dizziness, light-headedness, numbness and headaches.    History Past Medical History  Diagnosis Date  . Depression   . Hyperlipidemia   . Hypertension   . Erectile dysfunction   . Tubular adenoma of colon 11/2008  . Internal hemorrhoids   . BPH (benign prostatic hypertrophy)   . Allergic rhinitis     He has past surgical history that includes Appendectomy; Elbow surgery; Carpal tunnel release; and Inguinal hernia repair.   His family history includes Alzheimer's disease in his paternal uncle; Colon cancer in his maternal uncle; Heart disease in his father and mother; Hyperlipidemia in his father; Hypertension in his father; Parkinsonism in his father.He reports that he has never smoked. He has never used smokeless tobacco. He reports that he drinks alcohol. He reports that he does not use illicit drugs.  Current Outpatient Prescriptions on File Prior to Visit  Medication Sig Dispense Refill  . albuterol (PROVENTIL HFA;VENTOLIN HFA) 108 (90 Base) MCG/ACT inhaler Inhale 2 puffs into the lungs every 6 (six) hours as needed for wheezing. 1 Inhaler 5  . beclomethasone (QVAR) 40 MCG/ACT inhaler Inhale 2 puffs into the lungs 2 (two) times daily. 1 Inhaler 12  . celecoxib  (CELEBREX) 200 MG capsule Take 1 capsule (200 mg total) by mouth daily as needed. 30 capsule 11  . diazepam (VALIUM) 5 MG tablet Take 1 tablet (5 mg total) by mouth every 8 (eight) hours as needed for muscle spasms. 10 tablet 0  . lisinopril-hydrochlorothiazide (PRINZIDE,ZESTORETIC) 20-12.5 MG tablet Take 2 tablets by mouth daily. 60 tablet 11  . rosuvastatin (CRESTOR) 20 MG tablet Take 1 tablet (20 mg total) by mouth daily. 90 tablet 1  . sildenafil (VIAGRA) 100 MG tablet Take 0.5 tablets (50 mg total) by mouth daily as needed for erectile dysfunction. 10 tablet 0   No current facility-administered medications on file prior to visit.     Objective:  Objective Physical Exam  Constitutional: He is oriented to person, place, and time. Vital signs are normal. He appears well-developed and well-nourished. He is sleeping.  HENT:  Head: Normocephalic and atraumatic.  Mouth/Throat: Oropharynx is clear and moist.  Eyes: EOM are normal. Pupils are equal, round, and reactive to light.  Neck: Normal range of motion. Neck supple. No thyromegaly present.  Cardiovascular: Normal rate and regular rhythm.   No murmur heard. Pulmonary/Chest: Effort normal and breath sounds normal. No respiratory distress. He has no wheezes. He has no rales. He exhibits no tenderness.  Musculoskeletal: He exhibits no edema or tenderness.  Neurological: He is alert and oriented to person, place, and time.  Skin: Skin is warm and dry.  Psychiatric: He has a normal mood and affect. His behavior is normal. Judgment and thought content normal.  Nursing note and vitals reviewed.  BP 122/76 mmHg  Pulse 56  Temp(Src) 97.5 F (36.4 C) (  Oral)  Ht '5\' 11"'  (1.803 m)  Wt 227 lb 6.4 oz (103.148 kg)  BMI 31.73 kg/m2  SpO2 99% Wt Readings from Last 3 Encounters:  08/25/15 227 lb 6.4 oz (103.148 kg)  08/17/15 223 lb (101.152 kg)  04/17/15 225 lb (102.059 kg)     Lab Results  Component Value Date   WBC 8.5 08/25/2015   HGB  13.4 08/25/2015   HCT 39.5 08/25/2015   PLT 296.0 08/25/2015   GLUCOSE 106* 08/25/2015   CHOL 251* 08/25/2015   TRIG 370.0* 08/25/2015   HDL 54.20 08/25/2015   LDLDIRECT 67.0 08/25/2015   LDLCALC 67 10/12/2010   ALT 73* 08/25/2015   AST 48* 08/25/2015   NA 141 08/25/2015   K 3.5 08/25/2015   CL 105 08/25/2015   CREATININE 1.19 08/25/2015   BUN 16 08/25/2015   CO2 27 08/25/2015   TSH 2.06 08/25/2015   PSA 0.56 02/09/2015   HGBA1C 5.5 12/11/2012   MICROALBUR <0.7 02/11/2015    Dg Thoracic Spine 2 View  08/15/2014  CLINICAL DATA:  Persistent upper back pain since a fall 1 month ago. EXAM: THORACIC SPINE - 2 VIEW COMPARISON:  Chest x-ray of February 10, 2014. FINDINGS: The thoracic vertebral bodies are preserved in height. The intervertebral disc space heights are reasonably well maintained. The pedicles and transverse processes are intact. There are no abnormal paravertebral soft tissue densities. There is minimal curvature of the upper thoracic spine which is likely positional. IMPRESSION: There is no acute or significant chronic bony abnormality of the thoracic spine. Electronically Signed   By: David  Martinique   On: 08/15/2014 07:36     Assessment & Plan:  Plan I have discontinued Mr. Slyter's ibuprofen. I am also having him maintain his diazepam, beclomethasone, albuterol, lisinopril-hydrochlorothiazide, rosuvastatin, celecoxib, sildenafil, and testosterone.  Meds ordered this encounter  Medications  . testosterone (TESTIM) 50 MG/5GM (1%) GEL    Sig: Apply 50 mg topically.    Problem List Items Addressed This Visit    None    Visit Diagnoses    Hyperlipidemia    -  Primary    Relevant Orders    Comp Met (CMET) (Completed)    CBC with Differential/Platelet (Completed)    Lipid panel (Completed)    POCT urinalysis dipstick (Completed)    TSH (Completed)    Essential hypertension        Relevant Orders    Comp Met (CMET) (Completed)    CBC with Differential/Platelet  (Completed)    Lipid panel (Completed)    POCT urinalysis dipstick (Completed)    TSH (Completed)    Erectile dysfunction, unspecified erectile dysfunction type        Need for shingles vaccine        Relevant Orders    Varicella-zoster vaccine subcutaneous (Completed)    Hematuria        Relevant Orders    Urine culture (Completed)       Follow-up: Return in about 6 months (around 02/22/2016), or if symptoms worsen or fail to improve, for hypertension, hyperlipidemia, annual exam, fasting.  Garnet Koyanagi, DO

## 2015-08-31 ENCOUNTER — Other Ambulatory Visit: Payer: Self-pay

## 2015-08-31 MED ORDER — FENOFIBRATE 160 MG PO TABS: 160.0000 mg | ORAL_TABLET | Freq: Every day | ORAL | Status: DC

## 2015-09-01 ENCOUNTER — Other Ambulatory Visit: Payer: Self-pay | Admitting: Family Medicine

## 2015-09-01 DIAGNOSIS — E785 Hyperlipidemia, unspecified: Secondary | ICD-10-CM

## 2015-09-01 MED ORDER — FENOFIBRATE 160 MG PO TABS: 160.0000 mg | ORAL_TABLET | Freq: Every day | ORAL | Status: DC

## 2015-12-04 ENCOUNTER — Other Ambulatory Visit: Payer: Medicare Other

## 2016-01-20 MED FILL — VENTOLIN HFA 90 MCG INHALER: 108 (90 BAS | 30 days supply | Qty: 18 | Fill #0

## 2016-01-20 MED FILL — QVAR 40 MCG ORAL INHALER: 40 | 30 days supply | Qty: 9 | Fill #0

## 2016-01-20 MED FILL — LISINOPRIL-HCTZ 20-12.5 MG: 20-12.5 | 30 days supply | Qty: 60 | Fill #0

## 2016-01-20 MED FILL — !VIAGRA 50 MG TABLET: 50 | 30 days supply | Qty: 5 | Fill #0

## 2016-02-23 ENCOUNTER — Ambulatory Visit (INDEPENDENT_AMBULATORY_CARE_PROVIDER_SITE_OTHER): Payer: Managed Care, Other (non HMO) | Admitting: Family Medicine

## 2016-02-23 ENCOUNTER — Encounter: Payer: Self-pay | Admitting: Family Medicine

## 2016-02-23 VITALS — BP 138/90 | HR 60 | Temp 97.9°F | Wt 218.8 lb

## 2016-02-23 DIAGNOSIS — Z0000 Encounter for general adult medical examination without abnormal findings: Secondary | ICD-10-CM | POA: Diagnosis not present

## 2016-02-23 DIAGNOSIS — E785 Hyperlipidemia, unspecified: Secondary | ICD-10-CM | POA: Diagnosis not present

## 2016-02-23 DIAGNOSIS — I10 Essential (primary) hypertension: Secondary | ICD-10-CM

## 2016-02-23 LAB — CBC WITH DIFFERENTIAL/PLATELET
BASOS PCT: 0.7 % (ref 0.0–3.0)
Basophils Absolute: 0 10*3/uL (ref 0.0–0.1)
EOS ABS: 0.2 10*3/uL (ref 0.0–0.7)
EOS PCT: 2.6 % (ref 0.0–5.0)
HCT: 39.9 % (ref 39.0–52.0)
HEMOGLOBIN: 13.6 g/dL (ref 13.0–17.0)
LYMPHS ABS: 2.8 10*3/uL (ref 0.7–4.0)
Lymphocytes Relative: 46 % (ref 12.0–46.0)
MCHC: 34.1 g/dL (ref 30.0–36.0)
MCV: 91.3 fl (ref 78.0–100.0)
MONO ABS: 0.6 10*3/uL (ref 0.1–1.0)
Monocytes Relative: 9.2 % (ref 3.0–12.0)
NEUTROS ABS: 2.5 10*3/uL (ref 1.4–7.7)
NEUTROS PCT: 41.5 % — AB (ref 43.0–77.0)
PLATELETS: 269 10*3/uL (ref 150.0–400.0)
RBC: 4.37 Mil/uL (ref 4.22–5.81)
RDW: 15.7 % — AB (ref 11.5–15.5)
WBC: 6.1 10*3/uL (ref 4.0–10.5)

## 2016-02-23 LAB — COMPREHENSIVE METABOLIC PANEL
ALBUMIN: 4.3 g/dL (ref 3.5–5.2)
ALT: 101 U/L — ABNORMAL HIGH (ref 0–53)
AST: 78 U/L — AB (ref 0–37)
Alkaline Phosphatase: 64 U/L (ref 39–117)
BUN: 15 mg/dL (ref 6–23)
CALCIUM: 9.7 mg/dL (ref 8.4–10.5)
CHLORIDE: 102 meq/L (ref 96–112)
CO2: 27 meq/L (ref 19–32)
CREATININE: 1.11 mg/dL (ref 0.40–1.50)
GFR: 86.67 mL/min (ref >60.00)
Glucose, Bld: 88 mg/dL (ref 70–99)
POTASSIUM: 3.6 meq/L (ref 3.5–5.1)
Sodium: 139 mEq/L (ref 135–145)
Total Bilirubin: 0.6 mg/dL (ref 0.2–1.2)
Total Protein: 7.3 g/dL (ref 6.0–8.3)

## 2016-02-23 LAB — LIPID PANEL
CHOLESTEROL: 438 mg/dL — AB (ref 0–200)
HDL: 56.7 mg/dL (ref >39.00)
Total CHOL/HDL Ratio: 8

## 2016-02-23 LAB — TSH: TSH: 2.12 u[IU]/mL (ref 0.35–4.50)

## 2016-02-23 LAB — PSA: PSA: 0.67 ng/mL (ref 0.10–4.00)

## 2016-02-23 LAB — LDL CHOLESTEROL, DIRECT: Direct LDL: 80 mg/dL

## 2016-02-23 MED ORDER — ROSUVASTATIN CALCIUM 20 MG PO TABS: 20.0000 mg | ORAL_TABLET | Freq: Every day | ORAL | 5 refills | Status: DC

## 2016-02-23 MED ORDER — LISINOPRIL-HYDROCHLOROTHIAZIDE 20-12.5 MG PO TABS: 2.0000 | ORAL_TABLET | Freq: Every day | ORAL | 11 refills | Status: AC

## 2016-02-23 MED ORDER — FENOFIBRATE 160 MG PO TABS: 160.0000 mg | ORAL_TABLET | Freq: Every day | ORAL | 5 refills | Status: DC

## 2016-02-23 NOTE — Assessment & Plan Note (Signed)
ghm utd Check labs See AVS 

## 2016-02-23 NOTE — Progress Notes (Signed)
Pre visit review using our clinic review tool, if applicable. No additional management support is needed unless otherwise documented below in the visit note. 

## 2016-02-23 NOTE — Progress Notes (Signed)
Patient ID: Dennis Moore, male    DOB: Feb 22, 1956  Age: 60 y.o. MRN: [id number]    Subjective:  Subjective  HPI Dennis Moore presents for cpe and med refill.  No complaints.     Review of Systems  Constitutional: Negative.   HENT: Negative for congestion, ear pain, hearing loss, nosebleeds, postnasal drip, rhinorrhea, sinus pressure, sneezing and tinnitus.   Eyes: Negative for photophobia, discharge, itching and visual disturbance.  Respiratory: Negative.   Cardiovascular: Negative.   Gastrointestinal: Negative for abdominal distention, abdominal pain, anal bleeding, blood in stool and constipation.  Endocrine: Negative.   Genitourinary: Negative.   Musculoskeletal: Negative.   Skin: Negative.   Allergic/Immunologic: Negative.   Neurological: Negative for dizziness, weakness, light-headedness, numbness and headaches.  Psychiatric/Behavioral: Negative for agitation, confusion, decreased concentration, dysphoric mood, sleep disturbance and suicidal ideas. The patient is not nervous/anxious.     History Past Medical History:  Diagnosis Date  . Allergic rhinitis   . BPH (benign prostatic hypertrophy)   . Depression   . Erectile dysfunction   . Hyperlipidemia   . Hypertension   . Internal hemorrhoids   . Tubular adenoma of colon 11/2008    He has a past surgical history that includes Appendectomy; Elbow surgery; Carpal tunnel release; and Inguinal hernia repair.   His family history includes Alzheimer's disease in his paternal uncle; Colon cancer in his maternal uncle; Heart disease in his father and mother; Hyperlipidemia in his father; Hypertension in his father; Parkinsonism in his father.He reports that he has never smoked. He has never used smokeless tobacco. He reports that he drinks alcohol. He reports that he does not use drugs.  Current Outpatient Prescriptions on File Prior to Visit  Medication Sig Dispense Refill  . albuterol (PROVENTIL HFA;VENTOLIN HFA) 108  (90 Base) MCG/ACT inhaler Inhale 2 puffs into the lungs every 6 (six) hours as needed for wheezing. 1 Inhaler 5  . celecoxib (CELEBREX) 200 MG capsule Take 1 capsule (200 mg total) by mouth daily as needed. 30 capsule 11  . diazepam (VALIUM) 5 MG tablet Take 1 tablet (5 mg total) by mouth every 8 (eight) hours as needed for muscle spasms. 10 tablet 0  . sildenafil (VIAGRA) 100 MG tablet Take 0.5 tablets (50 mg total) by mouth daily as needed for erectile dysfunction. 10 tablet 0  . testosterone (TESTIM) 50 MG/5GM (1%) GEL Apply 50 mg topically.     No current facility-administered medications on file prior to visit.      Objective:  Objective  Physical Exam  Constitutional: He is oriented to person, place, and time. He appears well-developed and well-nourished. No distress.  HENT:  Head: Normocephalic and atraumatic.  Right Ear: External ear normal.  Left Ear: External ear normal.  Nose: Nose normal.  Mouth/Throat: Oropharynx is clear and moist. No oropharyngeal exudate.  Eyes: Conjunctivae and EOM are normal. Pupils are equal, round, and reactive to light. Right eye exhibits no discharge. Left eye exhibits no discharge.  Neck: Normal range of motion. Neck supple. No JVD present. No thyromegaly present.  Cardiovascular: Normal rate, regular rhythm and intact distal pulses.  Exam reveals no gallop and no friction rub.   No murmur heard. Pulmonary/Chest: Effort normal and breath sounds normal. No respiratory distress. He has no wheezes. He has no rales. He exhibits no tenderness.  Abdominal: Soft. Bowel sounds are normal. He exhibits no distension and no mass. There is no tenderness. There is no rebound and no guarding.  Genitourinary: Rectum  normal, prostate normal and penis normal. Rectal exam shows guaiac negative stool.  Musculoskeletal: Normal range of motion. He exhibits no edema or tenderness.  Lymphadenopathy:    He has no cervical adenopathy.  Neurological: He is alert and  oriented to person, place, and time. He displays normal reflexes. He exhibits normal muscle tone.  Skin: Skin is warm and dry. No rash noted. He is not diaphoretic. No erythema. No pallor.  Psychiatric: He has a normal mood and affect. His behavior is normal. Judgment and thought content normal.  Nursing note and vitals reviewed.  BP 138/90   Pulse 60   Temp 97.9 F (36.6 C) (Oral)   Wt 218 lb 12.8 oz (99.2 kg)   SpO2 98%   BMI 30.52 kg/m  Wt Readings from Last 3 Encounters:  02/23/16 218 lb 12.8 oz (99.2 kg)  08/25/15 227 lb 6.4 oz (103.1 kg)  08/17/15 223 lb (101.2 kg)     Lab Results  Component Value Date   WBC 8.5 08/25/2015   HGB 13.4 08/25/2015   HCT 39.5 08/25/2015   PLT 296.0 08/25/2015   GLUCOSE 106 (H) 08/25/2015   CHOL 251 (H) 08/25/2015   TRIG 370.0 (H) 08/25/2015   HDL 54.20 08/25/2015   LDLDIRECT 67.0 08/25/2015   LDLCALC 67 10/12/2010   ALT 73 (H) 08/25/2015   AST 48 (H) 08/25/2015   NA 141 08/25/2015   K 3.5 08/25/2015   CL 105 08/25/2015   CREATININE 1.19 08/25/2015   BUN 16 08/25/2015   CO2 27 08/25/2015   TSH 2.06 08/25/2015   PSA 0.56 02/09/2015   HGBA1C 5.5 12/11/2012   MICROALBUR <0.7 02/11/2015    Dg Thoracic Spine 2 View  Result Date: 08/15/2014 CLINICAL DATA:  Persistent upper back pain since a fall 1 month ago. EXAM: THORACIC SPINE - 2 VIEW COMPARISON:  Chest x-ray of February 10, 2014. FINDINGS: The thoracic vertebral bodies are preserved in height. The intervertebral disc space heights are reasonably well maintained. The pedicles and transverse processes are intact. There are no abnormal paravertebral soft tissue densities. There is minimal curvature of the upper thoracic spine which is likely positional. IMPRESSION: There is no acute or significant chronic bony abnormality of the thoracic spine. Electronically Signed   By: David  Martinique   On: 08/15/2014 07:36     Assessment & Plan:  Plan  I have discontinued Mr. Poteat's beclomethasone. I  am also having him maintain his diazepam, albuterol, celecoxib, sildenafil, testosterone, cyclobenzaprine, sildenafil, fenofibrate, rosuvastatin, and lisinopril-hydrochlorothiazide.  Meds ordered this encounter  Medications  . cyclobenzaprine (FLEXERIL) 10 MG tablet    Sig: Take 10 mg by mouth as needed.  . sildenafil (REVATIO) 20 MG tablet    Sig: Take 1 tablet by mouth as needed.  . fenofibrate 160 MG tablet    Sig: Take 1 tablet (160 mg total) by mouth daily.    Dispense:  30 tablet    Refill:  5  . rosuvastatin (CRESTOR) 20 MG tablet    Sig: Take 1 tablet (20 mg total) by mouth daily.    Dispense:  30 tablet    Refill:  5  . lisinopril-hydrochlorothiazide (PRINZIDE,ZESTORETIC) 20-12.5 MG tablet    Sig: Take 2 tablets by mouth daily.    Dispense:  60 tablet    Refill:  11    D/C PREVIOUS SCRIPTS FOR THIS MEDICATION    Problem List Items Addressed This Visit      Unprioritized   Essential hypertension  Slightly elevated--- med was changed --pt unsure how Go back to lisinopril hct 2 a day       Relevant Medications   sildenafil (REVATIO) 20 MG tablet   fenofibrate 160 MG tablet   rosuvastatin (CRESTOR) 20 MG tablet   lisinopril-hydrochlorothiazide (PRINZIDE,ZESTORETIC) 20-12.5 MG tablet   Hyperlipidemia    Check labs con't crestor      Relevant Medications   sildenafil (REVATIO) 20 MG tablet   fenofibrate 160 MG tablet   rosuvastatin (CRESTOR) 20 MG tablet   lisinopril-hydrochlorothiazide (PRINZIDE,ZESTORETIC) 20-12.5 MG tablet   Preventative health care - Primary    ghm utd Check labs See AVS      Relevant Orders   Comprehensive metabolic panel   Lipid panel   CBC with Differential/Platelet   TSH   PSA    Other Visit Diagnoses   None.     Follow-up: Return in about 6 months (around 08/25/2016) for hypertension, hyperlipidemia.  Ann Held, DO

## 2016-02-23 NOTE — Assessment & Plan Note (Signed)
Slightly elevated--- med was changed --pt unsure how Go back to lisinopril hct 2 a day

## 2016-02-23 NOTE — Patient Instructions (Signed)

## 2016-02-25 ENCOUNTER — Ambulatory Visit: Payer: Medicare Other | Admitting: Family Medicine

## 2016-03-07 ENCOUNTER — Other Ambulatory Visit: Payer: Self-pay

## 2016-03-07 DIAGNOSIS — J209 Acute bronchitis, unspecified: Secondary | ICD-10-CM

## 2016-03-07 MED ORDER — ALBUTEROL SULFATE HFA 108 (90 BASE) MCG/ACT IN AERS: 2.0000 | INHALATION_SPRAY | Freq: Four times a day (QID) | RESPIRATORY_TRACT | 3 refills | Status: DC | PRN

## 2016-03-22 ENCOUNTER — Other Ambulatory Visit: Payer: Self-pay | Admitting: *Deleted

## 2016-03-22 DIAGNOSIS — J209 Acute bronchitis, unspecified: Secondary | ICD-10-CM

## 2016-03-22 MED ORDER — ALBUTEROL SULFATE HFA 108 (90 BASE) MCG/ACT IN AERS: 2.0000 | INHALATION_SPRAY | Freq: Four times a day (QID) | RESPIRATORY_TRACT | 3 refills | Status: DC | PRN

## 2016-03-22 NOTE — Telephone Encounter (Signed)
PRINTED FOR PASS PROGRAM 

## 2016-06-23 ENCOUNTER — Other Ambulatory Visit: Payer: Self-pay

## 2016-06-23 DIAGNOSIS — J209 Acute bronchitis, unspecified: Secondary | ICD-10-CM

## 2016-06-23 MED ORDER — ALBUTEROL SULFATE HFA 108 (90 BASE) MCG/ACT IN AERS: 2.0000 | INHALATION_SPRAY | Freq: Four times a day (QID) | RESPIRATORY_TRACT | 3 refills | Status: AC | PRN

## 2016-10-24 ENCOUNTER — Encounter: Payer: Self-pay | Admitting: Gastroenterology

## 2017-12-21 ENCOUNTER — Other Ambulatory Visit: Payer: Self-pay | Admitting: Family Medicine

## 2019-02-21 ENCOUNTER — Other Ambulatory Visit: Payer: Self-pay

## 2019-02-21 DIAGNOSIS — Z20822 Contact with and (suspected) exposure to covid-19: Secondary | ICD-10-CM

## 2019-02-23 LAB — NOVEL CORONAVIRUS, NAA: SARS-CoV-2, NAA: NOT DETECTED

## 2019-04-24 ENCOUNTER — Emergency Department (HOSPITAL_COMMUNITY): Payer: 59

## 2019-04-24 ENCOUNTER — Inpatient Hospital Stay (HOSPITAL_COMMUNITY)
Admission: EM | Admit: 2019-04-24 | Discharge: 2019-05-08 | DRG: 207 | Discharge status: Against Medical Advice | Payer: 59 | Attending: Internal Medicine | Admitting: Internal Medicine

## 2019-04-24 ENCOUNTER — Encounter (HOSPITAL_COMMUNITY): Payer: Self-pay

## 2019-04-24 ENCOUNTER — Other Ambulatory Visit: Payer: Self-pay

## 2019-04-24 DIAGNOSIS — J9601 Acute respiratory failure with hypoxia: Secondary | ICD-10-CM | POA: Diagnosis not present

## 2019-04-24 DIAGNOSIS — J309 Allergic rhinitis, unspecified: Secondary | ICD-10-CM | POA: Diagnosis not present

## 2019-04-24 DIAGNOSIS — Z8349 Family history of other endocrine, nutritional and metabolic diseases: Secondary | ICD-10-CM

## 2019-04-24 DIAGNOSIS — E876 Hypokalemia: Secondary | ICD-10-CM | POA: Diagnosis not present

## 2019-04-24 DIAGNOSIS — R739 Hyperglycemia, unspecified: Secondary | ICD-10-CM | POA: Diagnosis not present

## 2019-04-24 DIAGNOSIS — Y92230 Patient room in hospital as the place of occurrence of the external cause: Secondary | ICD-10-CM | POA: Diagnosis not present

## 2019-04-24 DIAGNOSIS — J95851 Ventilator associated pneumonia: Secondary | ICD-10-CM | POA: Diagnosis not present

## 2019-04-24 DIAGNOSIS — T380X5A Adverse effect of glucocorticoids and synthetic analogues, initial encounter: Secondary | ICD-10-CM | POA: Diagnosis not present

## 2019-04-24 DIAGNOSIS — E874 Mixed disorder of acid-base balance: Secondary | ICD-10-CM | POA: Diagnosis not present

## 2019-04-24 DIAGNOSIS — E663 Overweight: Secondary | ICD-10-CM | POA: Diagnosis present

## 2019-04-24 DIAGNOSIS — F329 Major depressive disorder, single episode, unspecified: Secondary | ICD-10-CM | POA: Diagnosis present

## 2019-04-24 DIAGNOSIS — G92 Toxic encephalopathy: Secondary | ICD-10-CM | POA: Diagnosis not present

## 2019-04-24 DIAGNOSIS — N400 Enlarged prostate without lower urinary tract symptoms: Secondary | ICD-10-CM | POA: Diagnosis present

## 2019-04-24 DIAGNOSIS — Z789 Other specified health status: Secondary | ICD-10-CM | POA: Diagnosis not present

## 2019-04-24 DIAGNOSIS — E785 Hyperlipidemia, unspecified: Secondary | ICD-10-CM | POA: Diagnosis present

## 2019-04-24 DIAGNOSIS — S3739XA Other injury of urethra, initial encounter: Secondary | ICD-10-CM | POA: Diagnosis not present

## 2019-04-24 DIAGNOSIS — J1289 Other viral pneumonia: Secondary | ICD-10-CM | POA: Diagnosis not present

## 2019-04-24 DIAGNOSIS — T8383XA Hemorrhage of genitourinary prosthetic devices, implants and grafts, initial encounter: Secondary | ICD-10-CM | POA: Diagnosis not present

## 2019-04-24 DIAGNOSIS — Z880 Allergy status to penicillin: Secondary | ICD-10-CM

## 2019-04-24 DIAGNOSIS — Y848 Other medical procedures as the cause of abnormal reaction of the patient, or of later complication, without mention of misadventure at the time of the procedure: Secondary | ICD-10-CM | POA: Diagnosis not present

## 2019-04-24 DIAGNOSIS — R1310 Dysphagia, unspecified: Secondary | ICD-10-CM | POA: Diagnosis not present

## 2019-04-24 DIAGNOSIS — J80 Acute respiratory distress syndrome: Secondary | ICD-10-CM | POA: Diagnosis not present

## 2019-04-24 DIAGNOSIS — R0602 Shortness of breath: Secondary | ICD-10-CM | POA: Diagnosis present

## 2019-04-24 DIAGNOSIS — Y828 Other medical devices associated with adverse incidents: Secondary | ICD-10-CM | POA: Diagnosis not present

## 2019-04-24 DIAGNOSIS — R197 Diarrhea, unspecified: Secondary | ICD-10-CM | POA: Diagnosis not present

## 2019-04-24 DIAGNOSIS — Z5329 Procedure and treatment not carried out because of patient's decision for other reasons: Secondary | ICD-10-CM | POA: Diagnosis not present

## 2019-04-24 DIAGNOSIS — S3730XA Unspecified injury of urethra, initial encounter: Secondary | ICD-10-CM | POA: Diagnosis not present

## 2019-04-24 DIAGNOSIS — I454 Nonspecific intraventricular block: Secondary | ICD-10-CM | POA: Diagnosis present

## 2019-04-24 DIAGNOSIS — J189 Pneumonia, unspecified organism: Secondary | ICD-10-CM

## 2019-04-24 DIAGNOSIS — Z8249 Family history of ischemic heart disease and other diseases of the circulatory system: Secondary | ICD-10-CM

## 2019-04-24 DIAGNOSIS — I10 Essential (primary) hypertension: Secondary | ICD-10-CM | POA: Diagnosis present

## 2019-04-24 DIAGNOSIS — Z9049 Acquired absence of other specified parts of digestive tract: Secondary | ICD-10-CM

## 2019-04-24 DIAGNOSIS — J9621 Acute and chronic respiratory failure with hypoxia: Secondary | ICD-10-CM | POA: Diagnosis present

## 2019-04-24 DIAGNOSIS — U071 COVID-19: Principal | ICD-10-CM

## 2019-04-24 DIAGNOSIS — J069 Acute upper respiratory infection, unspecified: Secondary | ICD-10-CM | POA: Diagnosis not present

## 2019-04-24 DIAGNOSIS — Z9119 Patient's noncompliance with other medical treatment and regimen: Secondary | ICD-10-CM | POA: Diagnosis not present

## 2019-04-24 DIAGNOSIS — E870 Hyperosmolality and hypernatremia: Secondary | ICD-10-CM | POA: Diagnosis not present

## 2019-04-24 DIAGNOSIS — Z6829 Body mass index (BMI) 29.0-29.9, adult: Secondary | ICD-10-CM

## 2019-04-24 DIAGNOSIS — F32A Depression, unspecified: Secondary | ICD-10-CM | POA: Diagnosis present

## 2019-04-24 DIAGNOSIS — Z9181 History of falling: Secondary | ICD-10-CM

## 2019-04-24 DIAGNOSIS — F05 Delirium due to known physiological condition: Secondary | ICD-10-CM | POA: Diagnosis not present

## 2019-04-24 DIAGNOSIS — Y732 Prosthetic and other implants, materials and accessory gastroenterology and urology devices associated with adverse incidents: Secondary | ICD-10-CM | POA: Diagnosis not present

## 2019-04-24 DIAGNOSIS — F320 Major depressive disorder, single episode, mild: Secondary | ICD-10-CM | POA: Diagnosis not present

## 2019-04-24 DIAGNOSIS — J9690 Respiratory failure, unspecified, unspecified whether with hypoxia or hypercapnia: Secondary | ICD-10-CM

## 2019-04-24 DIAGNOSIS — Z820 Family history of epilepsy and other diseases of the nervous system: Secondary | ICD-10-CM

## 2019-04-24 DIAGNOSIS — R509 Fever, unspecified: Secondary | ICD-10-CM

## 2019-04-24 LAB — BASIC METABOLIC PANEL
Anion gap: 15 (ref 5–15)
BUN: 18 mg/dL (ref 8–23)
CO2: 20 mmol/L — ABNORMAL LOW (ref 22–32)
Calcium: 8.3 mg/dL — ABNORMAL LOW (ref 8.9–10.3)
Chloride: 99 mmol/L (ref 98–111)
Creatinine, Ser: 1.36 mg/dL — ABNORMAL HIGH (ref 0.61–1.24)
GFR calc Af Amer: >60 mL/min (ref >60)
GFR calc non Af Amer: 55 mL/min — ABNORMAL LOW (ref >60)
Glucose, Bld: 118 mg/dL — ABNORMAL HIGH (ref 70–99)
Potassium: 3.4 mmol/L — ABNORMAL LOW (ref 3.5–5.1)
Sodium: 134 mmol/L — ABNORMAL LOW (ref 135–145)

## 2019-04-24 LAB — POCT I-STAT 7, (LYTES, BLD GAS, ICA,H+H)
Acid-base deficit: 1 mmol/L (ref 0.0–2.0)
Acid-base deficit: 2 mmol/L (ref 0.0–2.0)
Bicarbonate: 22.8 mmol/L (ref 20.0–28.0)
Bicarbonate: 24.9 mmol/L (ref 20.0–28.0)
Calcium, Ion: 1.12 mmol/L — ABNORMAL LOW (ref 1.15–1.40)
Calcium, Ion: 1.15 mmol/L (ref 1.15–1.40)
HCT: 46 % (ref 39.0–52.0)
HCT: 33 % — ABNORMAL LOW (ref 39.0–52.0)
Hemoglobin: 15.6 g/dL (ref 13.0–17.0)
Hemoglobin: 11.2 g/dL — ABNORMAL LOW (ref 13.0–17.0)
O2 Saturation: 90 %
O2 Saturation: 95 %
Patient temperature: 98.6
Patient temperature: 98.6
Potassium: 3.5 mmol/L (ref 3.5–5.1)
Potassium: 3.6 mmol/L (ref 3.5–5.1)
Sodium: 134 mmol/L — ABNORMAL LOW (ref 135–145)
Sodium: 135 mmol/L (ref 135–145)
TCO2: 24 mmol/L (ref 22–32)
TCO2: 26 mmol/L (ref 22–32)
pCO2 arterial: 34 mmHg (ref 32.0–48.0)
pCO2 arterial: 50.7 mmHg — ABNORMAL HIGH (ref 32.0–48.0)
pH, Arterial: 7.435 (ref 7.350–7.450)
pH, Arterial: 7.3 — ABNORMAL LOW (ref 7.350–7.450)
pO2, Arterial: 55 mmHg — ABNORMAL LOW (ref 83.0–108.0)
pO2, Arterial: 83 mmHg (ref 83.0–108.0)

## 2019-04-24 LAB — HEPATIC FUNCTION PANEL
ALT: 58 U/L — ABNORMAL HIGH (ref 0–44)
AST: 81 U/L — ABNORMAL HIGH (ref 15–41)
Albumin: 3 g/dL — ABNORMAL LOW (ref 3.5–5.0)
Alkaline Phosphatase: 69 U/L (ref 38–126)
Bilirubin, Direct: 0.2 mg/dL (ref 0.0–0.2)
Indirect Bilirubin: 0.3 mg/dL (ref 0.3–0.9)
Total Bilirubin: 0.5 mg/dL (ref 0.3–1.2)
Total Protein: 7.6 g/dL (ref 6.5–8.1)

## 2019-04-24 LAB — CBC
HCT: 32.4 % — ABNORMAL LOW (ref 39.0–52.0)
Hemoglobin: 11.8 g/dL — ABNORMAL LOW (ref 13.0–17.0)
MCH: 31.6 pg (ref 26.0–34.0)
MCHC: 36.4 g/dL — ABNORMAL HIGH (ref 30.0–36.0)
MCV: 86.6 fL (ref 80.0–100.0)
Platelets: 300 10*3/uL (ref 150–400)
RBC: 3.74 MIL/uL — ABNORMAL LOW (ref 4.22–5.81)
RDW: 14 % (ref 11.5–15.5)
WBC: 11.3 10*3/uL — ABNORMAL HIGH (ref 4.0–10.5)
nRBC: 0 % (ref 0.0–0.2)

## 2019-04-24 LAB — PROCALCITONIN: Procalcitonin: 0.31 ng/mL

## 2019-04-24 LAB — TROPONIN I (HIGH SENSITIVITY)
Troponin I (High Sensitivity): 22 ng/L — ABNORMAL HIGH (ref <18)
Troponin I (High Sensitivity): 26 ng/L — ABNORMAL HIGH (ref <18)

## 2019-04-24 LAB — LACTATE DEHYDROGENASE: LDH: 380 U/L — ABNORMAL HIGH (ref 98–192)

## 2019-04-24 LAB — D-DIMER, QUANTITATIVE: D-Dimer, Quant: 1.23 ug/mL-FEU — ABNORMAL HIGH (ref 0.00–0.50)

## 2019-04-24 LAB — FIBRINOGEN: Fibrinogen: >800 mg/dL — ABNORMAL HIGH (ref 210–475)

## 2019-04-24 LAB — TRIGLYCERIDES: Triglycerides: 194 mg/dL — ABNORMAL HIGH (ref <150)

## 2019-04-24 MED ORDER — LEVOFLOXACIN IN D5W 750 MG/150ML IV SOLN
750.0000 mg | INTRAVENOUS | Status: DC
  Administered 2019-04-25: 01:00:00 750 mg via INTRAVENOUS
  Filled 2019-04-24 (×2): qty 150

## 2019-04-24 MED ORDER — ETOMIDATE 2 MG/ML IV SOLN: 30.0000 mg | Freq: Once | INTRAVENOUS | Status: DC

## 2019-04-24 MED ORDER — ONDANSETRON HCL 4 MG PO TABS: 4.0000 mg | ORAL_TABLET | Freq: Four times a day (QID) | ORAL | Status: DC | PRN

## 2019-04-24 MED ORDER — ENOXAPARIN SODIUM 40 MG/0.4ML ~~LOC~~ SOLN
40.0000 mg | SUBCUTANEOUS | Status: DC
  Administered 2019-04-25: 40 mg via SUBCUTANEOUS
  Filled 2019-04-24 (×2): qty 0.4

## 2019-04-24 MED ORDER — SODIUM CHLORIDE 0.9 % IV SOLN: INTRAVENOUS | Status: DC

## 2019-04-24 MED ORDER — MIDAZOLAM HCL 2 MG/2ML IJ SOLN
INTRAMUSCULAR | Status: AC
  Administered 2019-04-24: 21:00:00
  Filled 2019-04-24: qty 2

## 2019-04-24 MED ORDER — ONDANSETRON HCL 4 MG/2ML IJ SOLN: 4.0000 mg | Freq: Four times a day (QID) | INTRAMUSCULAR | Status: DC | PRN

## 2019-04-24 MED ORDER — FENTANYL 2500MCG IN NS 250ML (10MCG/ML) PREMIX INFUSION
0.0000 ug/h | INTRAVENOUS | Status: DC
  Administered 2019-04-24: 325 ug/h via INTRAVENOUS
  Filled 2019-04-24 (×2): qty 250

## 2019-04-24 MED ORDER — INSULIN ASPART 100 UNIT/ML ~~LOC~~ SOLN
0.0000 [IU] | SUBCUTANEOUS | Status: DC
  Administered 2019-04-25 (×3): 2 [IU] via SUBCUTANEOUS
  Administered 2019-04-25: 1 [IU] via SUBCUTANEOUS
  Administered 2019-04-25: 2 [IU] via SUBCUTANEOUS
  Administered 2019-04-26 – 2019-04-27 (×11): 1 [IU] via SUBCUTANEOUS
  Administered 2019-04-28: 17:00:00 2 [IU] via SUBCUTANEOUS
  Administered 2019-04-28 (×3): 1 [IU] via SUBCUTANEOUS
  Administered 2019-04-28: 2 [IU] via SUBCUTANEOUS
  Administered 2019-04-28 – 2019-04-29 (×2): 1 [IU] via SUBCUTANEOUS
  Administered 2019-04-29 (×3): 2 [IU] via SUBCUTANEOUS
  Administered 2019-04-29: 1 [IU] via SUBCUTANEOUS
  Administered 2019-04-29: 2 [IU] via SUBCUTANEOUS
  Administered 2019-04-30: 1 [IU] via SUBCUTANEOUS
  Administered 2019-04-30 (×2): 2 [IU] via SUBCUTANEOUS
  Administered 2019-04-30 (×2): 1 [IU] via SUBCUTANEOUS
  Administered 2019-04-30 – 2019-05-01 (×2): 2 [IU] via SUBCUTANEOUS
  Administered 2019-05-01: 1 [IU] via SUBCUTANEOUS
  Administered 2019-05-01: 3 [IU] via SUBCUTANEOUS
  Administered 2019-05-01 (×2): 1 [IU] via SUBCUTANEOUS
  Administered 2019-05-02 (×2): 3 [IU] via SUBCUTANEOUS
  Administered 2019-05-02: 1 [IU] via SUBCUTANEOUS
  Administered 2019-05-02 (×2): 2 [IU] via SUBCUTANEOUS
  Administered 2019-05-02: 1 [IU] via SUBCUTANEOUS
  Administered 2019-05-02: 2 [IU] via SUBCUTANEOUS
  Administered 2019-05-03: 1 [IU] via SUBCUTANEOUS
  Administered 2019-05-03: 2 [IU] via SUBCUTANEOUS
  Administered 2019-05-03: 05:00:00 5 [IU] via SUBCUTANEOUS

## 2019-04-24 MED ORDER — SODIUM CHLORIDE 0.9 % IV SOLN: Freq: Once | INTRAVENOUS | Status: DC

## 2019-04-24 MED ORDER — PANTOPRAZOLE SODIUM 40 MG IV SOLR: 40.0000 mg | Freq: Every day | INTRAVENOUS | Status: DC

## 2019-04-24 MED ORDER — ETOMIDATE 2 MG/ML IV SOLN
INTRAVENOUS | Status: AC | PRN
  Administered 2019-04-24: 30 mg via INTRAVENOUS

## 2019-04-24 MED ORDER — DEXAMETHASONE SODIUM PHOSPHATE 10 MG/ML IJ SOLN
6.0000 mg | INTRAMUSCULAR | Status: DC
  Administered 2019-04-25 – 2019-05-03 (×9): 6 mg via INTRAVENOUS
  Filled 2019-04-24 (×9): qty 1

## 2019-04-24 MED ORDER — ACETAMINOPHEN 325 MG PO TABS
650.0000 mg | ORAL_TABLET | Freq: Four times a day (QID) | ORAL | Status: DC | PRN
  Administered 2019-04-27: 05:00:00 650 mg via ORAL
  Filled 2019-04-24: qty 2

## 2019-04-24 MED ORDER — MIDAZOLAM HCL 2 MG/2ML IJ SOLN
1.0000 mg | INTRAMUSCULAR | Status: DC | PRN
  Administered 2019-04-25 (×2): 1 mg via INTRAVENOUS
  Filled 2019-04-24 (×2): qty 2

## 2019-04-24 MED ORDER — ETOMIDATE 2 MG/ML IV SOLN
INTRAVENOUS | Status: AC | PRN
  Administered 2019-04-24: 2940 mg via INTRAVENOUS

## 2019-04-24 MED ORDER — SUCCINYLCHOLINE CHLORIDE 20 MG/ML IJ SOLN
150.0000 mg | Freq: Once | INTRAMUSCULAR | Status: DC
  Filled 2019-04-24: qty 7.5

## 2019-04-24 MED ORDER — SUCCINYLCHOLINE CHLORIDE 20 MG/ML IJ SOLN
INTRAMUSCULAR | Status: AC | PRN
  Administered 2019-04-24: 150 mg via INTRAVENOUS

## 2019-04-24 NOTE — Code Documentation (Signed)
Pt is A&Ox4 

## 2019-04-24 NOTE — ED Provider Notes (Signed)
[facility] Emergency Department Provider Note MRN:  [id number]  Arrival date & time: 04/24/19     Chief Complaint   Shortness of Breath   History of Present Illness   Dennis Moore is a 63 y.o. year-old male with a history of hypertension, hyperlipidemia, BPH presenting to the ED with chief complaint of shortness of breath.  Patient tested positive for COVID-19 1 week ago.  Has had progressively worsening cough, now with shortness of breath for the past 2 or 3 days.  Denies chest pain.  Intermittent fevers.  No abdominal pain.  No vomiting, no diarrhea.  Symptoms constant, moderate in severity, no exacerbating or alleviating factors.  Review of Systems  A complete 10 system review of systems was obtained and all systems are negative except as noted in the HPI and PMH.   Patient's Health History    Past Medical History:  Diagnosis Date   Allergic rhinitis    BPH (benign prostatic hypertrophy)    Depression    Erectile dysfunction    Hyperlipidemia    Hypertension    Internal hemorrhoids    Tubular adenoma of colon 11/2008    Past Surgical History:  Procedure Laterality Date   APPENDECTOMY     CARPAL TUNNEL RELEASE     right   ELBOW SURGERY     right   INGUINAL HERNIA REPAIR     age 70    Family History  Problem Relation Age of Onset   Alzheimer's disease Paternal Uncle    Parkinsonism Father    Hyperlipidemia Father    Hypertension Father    Heart disease Father    Colon cancer Maternal Uncle    Heart disease Mother     Social History   Socioeconomic History   Marital status: Married    Spouse name: Not on file   Number of children: 6   Years of education: Not on file   Highest education level: Not on file  Occupational History   Occupation: disabled    Fish farm manager: DISABLED  Social Designer, fashion/clothing strain: Not on file   Food insecurity    Worry: Not on file    Inability: Not on file    Transportation needs    Medical: Not on file    Non-medical: Not on file  Tobacco Use   Smoking status: Never Smoker   Smokeless tobacco: Never Used  Substance and Sexual Activity   Alcohol use: Yes    Comment: occ   Drug use: No   Sexual activity: Not on file  Lifestyle   Physical activity    Days per week: Not on file    Minutes per session: Not on file   Stress: Not on file  Relationships   Social connections    Talks on phone: Not on file    Gets together: Not on file    Attends religious service: Not on file    Active member of club or organization: Not on file    Attends meetings of clubs or organizations: Not on file    Relationship status: Not on file   Intimate partner violence    Fear of current or ex partner: Not on file    Emotionally abused: Not on file    Physically abused: Not on file    Forced sexual activity: Not on file  Other Topics Concern   Not on file  Social History Narrative   Not on file  Physical Exam  Vital Signs and Nursing Notes reviewed Vitals:   04/24/19 2130 04/24/19 2200  BP: 119/70 102/63  Pulse: 91 72  Resp: (!) 21 16  Temp:    SpO2: (!) 89% 93%    CONSTITUTIONAL: Well-appearing, NAD NEURO:  Alert and oriented x 3, no focal deficits EYES:  eyes equal and reactive ENT/NECK:  no LAD, no JVD CARDIO: Regular rate, well-perfused, normal S1 and S2 PULM: Decreased breath sounds bilateral bases, tachypneic GI/GU:  normal bowel sounds, non-distended, non-tender MSK/SPINE:  No gross deformities, no edema SKIN:  no rash, atraumatic PSYCH:  Appropriate speech and behavior  Diagnostic and Interventional Summary    EKG Interpretation  Date/Time:  Wednesday April 24 2019 18:05:44 EDT Ventricular Rate:  85 PR Interval:    QRS Duration: 116 QT Interval:  403 QTC Calculation: 480 R Axis:   13 Text Interpretation:  Sinus rhythm Nonspecific intraventricular conduction delay Nonspecific T abnrm, anterolateral leads  Confirmed by Gerlene Fee [phone]) on 04/24/2019 6:57:02 PM      Labs Reviewed  BASIC METABOLIC PANEL - Abnormal; Notable for the following components:      Result Value   Sodium 134 (*)    Potassium 3.4 (*)    CO2 20 (*)    Glucose, Bld 118 (*)    Creatinine, Ser 1.36 (*)    Calcium 8.3 (*)    GFR calc non Af Amer 55 (*)    All other components within normal limits  CBC - Abnormal; Notable for the following components:   WBC 11.3 (*)    RBC 3.74 (*)    Hemoglobin 11.8 (*)    HCT 32.4 (*)    MCHC 36.4 (*)    All other components within normal limits  D-DIMER, QUANTITATIVE (NOT AT [facility]) - Abnormal; Notable for the following components:   D-Dimer, Quant 1.23 (*)    All other components within normal limits  LACTATE DEHYDROGENASE - Abnormal; Notable for the following components:   LDH 380 (*)    All other components within normal limits  TRIGLYCERIDES - Abnormal; Notable for the following components:   Triglycerides 194 (*)    All other components within normal limits  FIBRINOGEN - Abnormal; Notable for the following components:   Fibrinogen >800 (*)    All other components within normal limits  HEPATIC FUNCTION PANEL - Abnormal; Notable for the following components:   Albumin 3.0 (*)    AST 81 (*)    ALT 58 (*)    All other components within normal limits  POCT I-STAT 7, (LYTES, BLD GAS, ICA,H+H) - Abnormal; Notable for the following components:   pO2, Arterial 55.0 (*)    Sodium 134 (*)    Calcium, Ion 1.12 (*)    All other components within normal limits  POCT I-STAT 7, (LYTES, BLD GAS, ICA,H+H) - Abnormal; Notable for the following components:   pH, Arterial 7.300 (*)    pCO2 arterial 50.7 (*)    HCT 33.0 (*)    Hemoglobin 11.2 (*)    All other components within normal limits  TROPONIN I (HIGH SENSITIVITY) - Abnormal; Notable for the following components:   Troponin I (High Sensitivity) 22 (*)    All other components within normal limits  TROPONIN I (HIGH  SENSITIVITY) - Abnormal; Notable for the following components:   Troponin I (High Sensitivity) 26 (*)    All other components within normal limits  SARS CORONAVIRUS 2 BY RT PCR (HOSPITAL ORDER, [place]  [facility] LAB)  CULTURE, BLOOD (SINGLE)  PROCALCITONIN  FERRITIN  C-REACTIVE PROTEIN  HIV ANTIBODY (ROUTINE TESTING W REFLEX)  HIV4GL SAVE TUBE  CBC WITH DIFFERENTIAL/PLATELET  COMPREHENSIVE METABOLIC PANEL  C-REACTIVE PROTEIN  D-DIMER, QUANTITATIVE (NOT AT [facility])  I-STAT ARTERIAL BLOOD GAS, ED  ABO/RH    DG Chest Port 1 View  Final Result  Addendum 1 of 1  ADDENDUM REPORT: 04/24/2019 22:52    ADDENDUM:  ETT position discussed by telephone with Dr. Gerlene Fee on  04/24/2019 at 22:50 hours.      Electronically Signed    By: Genevie Ann M.D.    On: 04/24/2019 22:52      Final    DG Chest Portable 1 View  Final Result    CT ANGIO CHEST PE W OR WO CONTRAST    (Results Pending)    Medications  fentaNYL 2563mcg in NS 22mL (43mcg/ml) infusion-PREMIX (350 mcg/hr Intravenous Rate/Dose Change 04/24/19 2234)  etomidate (AMIDATE) injection 30 mg (has no administration in time range)  succinylcholine (ANECTINE) injection 150 mg (has no administration in time range)  0.9 %  sodium chloride infusion (has no administration in time range)  midazolam (VERSED) injection 1 mg (has no administration in time range)  insulin aspart (novoLOG) injection 0-9 Units (has no administration in time range)  enoxaparin (LOVENOX) injection 40 mg (has no administration in time range)  dexamethasone (DECADRON) injection 6 mg (has no administration in time range)  pantoprazole (PROTONIX) injection 40 mg (has no administration in time range)  acetaminophen (TYLENOL) tablet 650 mg (has no administration in time range)  ondansetron (ZOFRAN) tablet 4 mg (has no administration in time range)    Or  ondansetron (ZOFRAN) injection 4 mg (has no administration in time range)  etomidate (AMIDATE)  injection (2,940 mg Intravenous Given 04/24/19 2103)  midazolam (VERSED) 2 MG/2ML injection (  Given 04/24/19 2123)  etomidate (AMIDATE) injection (30 mg Intravenous Given 04/24/19 2103)  succinylcholine (ANECTINE) injection (150 mg Intravenous Given 04/24/19 2105)     .Critical Care Performed by: Maudie Flakes, MD Authorized by: Maudie Flakes, MD   Critical care provider statement:    Critical care time (minutes):  37   Critical care time was exclusive of:  Separately billable procedures and treating other patients   Critical care was necessary to treat or prevent imminent or life-threatening deterioration of the following conditions:  Respiratory failure   Critical care was time spent personally by me on the following activities:  Examination of patient, evaluation of patient's response to treatment, discussions with consultants, ordering and performing treatments and interventions, ordering and review of laboratory studies, ordering and review of radiographic studies, re-evaluation of patient's condition and review of old charts  Procedure Name: Intubation Date/Time: 04/24/2019 11:09 PM Performed by: Maudie Flakes, MD Pre-anesthesia Checklist: Patient identified, Suction available, Emergency Drugs available, Patient being monitored and Timeout performed Oxygen Delivery Method: Non-rebreather mask Preoxygenation: Pre-oxygenation with 100% oxygen Induction Type: Rapid sequence Laryngoscope Size: Glidescope and 4 Grade View: Grade I Tube size: 7.5 mm Number of attempts: 1 Airway Equipment and Method: Rigid stylet Placement Confirmation: ETT inserted through vocal cords under direct vision,  Positive ETCO2,  CO2 detector and Breath sounds checked- equal and bilateral Secured at: 24 cm Tube secured with: ETT holder Comments: Some difficulty due to anterior larynx, view improved with cricoid pressure.  Otherwise uncomplicated intubation with 30 mg etomidate, 150 mg  succinylcholine.      Critical Care  ED Course and  Medical Decision Making  I have reviewed the triage vital signs and the nursing notes.  Pertinent labs & imaging results that were available during my care of the patient were reviewed by me and considered in my medical decision making (see below for details).  COVID-19 with significant hypoxia in this otherwise fairly healthy 63 year old male.  On my initial evaluation saturations are 89% on nonrebreather.  Tachypneic with a respiratory rate ranging from 30-40.  Otherwise without acute distress, conversing, normal mental status.  Patient is hesitant to be intubated.  Will consult respiratory therapy for possible high flow nasal cannula and obtain ABG to determine severity of hypoxia.    Pulmonary embolism thought to be less likely but also a consideration, when his oxygenation is more thoroughly evaluated, will consider CTA chest.  ABG is concerning for ARDS given patient's PaO2 of 55 despite being on 100% FiO2.  I had a long discussion with patient regarding his condition, discussed the risks and benefits of intubation and he agrees with intubation.  Performed as described above.  Will admit to intensivist service.  Received Solu-Medrol by EMS.  Barth Kirks. [place]onia Small, [place] [email or web address]  Final Clinical Impressions(s) / ED Diagnoses     ICD-10-CM   1. COVID-19  U07.1   2. Intubation of airway performed without difficulty  Z78.9 DG Chest [facility] 1 View    DG Chest Port 1 View  3. Multifocal pneumonia  J18.9   4. Acute respiratory failure with hypoxia (HCC)  J96.01     ED Discharge Orders    None      Discharge Instructions Discussed with and Provided to Patient: Discharge Instructions   None       Maudie Flakes, MD 04/24/19 2322

## 2019-04-24 NOTE — ED Triage Notes (Signed)
Pt arrived via [facility] from [facility] for increased SOB, cough, fever. Pt COVID + as of last Wed. EMS gave tylenol 1000mg , solumedrol 125mg  IV, Per EMS lungs clear. Pt is A&Ox4. 86% on RA. NSR on monitor. Pt tacypneic. Skin color WNL.

## 2019-04-24 NOTE — ED Notes (Signed)
150 mcg bolus fentanyl

## 2019-04-24 NOTE — ED Notes (Signed)
OG attempted. 150 mcg bolus fentanyl

## 2019-04-24 NOTE — H&P (Signed)
History and Physical    Dennis Moore [id number] DOB: February 01, 1956 DOA: 04/24/2019  PCP: Ann Held, DO  Patient coming from: Home  I have personally briefly reviewed patient's old medical records in [place]  Chief Complaint: Respiratory distress, COVID  HPI: Dennis Moore is a 63 y.o. male with medical history significant of HTN.  From PCP records I can see that the patient called 10/7 with fevers and cough onset 10/6.  He was tested for COVID on 10/8 and the test came back positive (RT PCR, result visible to [place] under Care Everywhere > Lab results [place] > Viral tests).  Patient presented to the ED with worsening SOB, respiratory distress.  Was satting 60s on RA  Per EDP note: "Has had progressively worsening cough, now with shortness of breath for the past 2 or 3 days.  Denies chest pain.  Intermittent fevers.  No abdominal pain.  No vomiting, no diarrhea.  Symptoms constant, moderate in severity, no exacerbating or alleviating factors."  ED Course: Sats were still 89% on NRB, tachypnic with RR 30-40s.  CXR showing multifocal bilateral PNA c/w COVID  EDP made decision to intubate patient and patient was intubated.  Hospitalist has been called to admit patient over to [facility] and PCCM has been consulted.  Review of Systems: Unable to perform, patient intubated, sedated.  Past Medical History:  Diagnosis Date  . Allergic rhinitis   . BPH (benign prostatic hypertrophy)   . Depression   . Erectile dysfunction   . Hyperlipidemia   . Hypertension   . Internal hemorrhoids   . Tubular adenoma of colon 11/2008    Past Surgical History:  Procedure Laterality Date  . APPENDECTOMY    . CARPAL TUNNEL RELEASE     right  . ELBOW SURGERY     right  . INGUINAL HERNIA REPAIR     age 28     reports that he has never smoked. He has never used smokeless tobacco. He reports current alcohol use. He reports that he does not use drugs.  Allergies   Allergen Reactions  . Penicillins Swelling and Rash    Family History  Problem Relation Age of Onset  . Alzheimer's disease Paternal Uncle   . Parkinsonism Father   . Hyperlipidemia Father   . Hypertension Father   . Heart disease Father   . Colon cancer Maternal Uncle   . Heart disease Mother      Prior to Admission medications   Medication Sig Start Date End Date Taking? Authorizing Provider  albuterol (PROVENTIL HFA;VENTOLIN HFA) 108 (90 Base) MCG/ACT inhaler Inhale 2 puffs into the lungs every 6 (six) hours as needed for wheezing. 06/23/16   Charlott Rakes, MD  celecoxib (CELEBREX) 200 MG capsule Take 1 capsule (200 mg total) by mouth daily as needed. 08/17/15   Micheline Chapman, NP  cyclobenzaprine (FLEXERIL) 10 MG tablet Take 10 mg by mouth as needed. 06/30/15   [provider]  diazepam (VALIUM) 5 MG tablet Take 1 tablet (5 mg total) by mouth every 8 (eight) hours as needed for muscle spasms. 08/18/14   Roma Schanz R, DO  fenofibrate 160 MG tablet Take 1 tablet (160 mg total) by mouth daily. 02/23/16   Ann Held, DO  lisinopril-hydrochlorothiazide (PRINZIDE,ZESTORETIC) 20-12.5 MG tablet Take 2 tablets by mouth daily. 02/23/16   Roma Schanz R, DO  rosuvastatin (CRESTOR) 20 MG tablet Take 1 tablet (20 mg total) by  mouth daily. 02/23/16 02/22/17  Ann Held, DO  sildenafil (VIAGRA) 100 MG tablet Take 0.5 tablets (50 mg total) by mouth daily as needed for erectile dysfunction. 08/17/15   Micheline Chapman, NP  testosterone (TESTIM) 50 MG/5GM (1%) GEL Apply 50 mg topically. 08/03/15   [provider]    Physical Exam: Vitals:   04/24/19 2100 04/24/19 2109 04/24/19 2130 04/24/19 2200  BP: 125/70  119/70 102/63  Pulse: 76  91 72  Resp: (!) 40  (!) 21 16  Temp:      TempSrc:      SpO2: 99% (!) 88% (!) 89% 93%  Weight:      Height:        Constitutional: Intubated and sedated Eyes: PERRL, lids and conjunctivae normal ENMT:  Mucous membranes are moist. Posterior pharynx clear of any exudate or lesions.Normal dentition.  Neck: normal, supple, no masses, no thyromegaly Respiratory: Mechanical vent sounds Cardiovascular: Regular rate and rhythm, no murmurs / rubs / gallops. No extremity edema. 2+ pedal pulses. No carotid bruits.  Abdomen: no tenderness, no masses palpated. No hepatosplenomegaly. Bowel sounds positive.  Musculoskeletal: no clubbing / cyanosis. No joint deformity upper and lower extremities. Good ROM, no contractures. Normal muscle tone.  Skin: no rashes, lesions, ulcers. No induration Neurologic: MAE, intubated and sedated Psychiatric: Intubated and sedated, was AAOx4 per report prior to intubation.   Labs on Admission: I have personally reviewed following labs and imaging studies  CBC: Recent Labs  Lab 04/24/19 1803 04/24/19 1956  WBC 11.3*  --   HGB 11.8* 15.6  HCT 32.4* 46.0  MCV 86.6  --   PLT 300  --    Basic Metabolic Panel: Recent Labs  Lab 04/24/19 1803 04/24/19 1956  NA 134* 134*  K 3.4* 3.5  CL 99  --   CO2 20*  --   GLUCOSE 118*  --   BUN 18  --   CREATININE 1.36*  --   CALCIUM 8.3*  --    GFR: Estimated Creatinine Clearance: 67.5 mL/min (A) (by C-G formula based on SCr of 1.36 mg/dL (H)). Liver Function Tests: No results for input(s): AST, ALT, ALKPHOS, BILITOT, PROT, ALBUMIN in the last 168 hours. No results for input(s): LIPASE, AMYLASE in the last 168 hours. No results for input(s): AMMONIA in the last 168 hours. Coagulation Profile: No results for input(s): INR, PROTIME in the last 168 hours. Cardiac Enzymes: No results for input(s): CKTOTAL, CKMB, CKMBINDEX, TROPONINI in the last 168 hours. BNP (last 3 results) No results for input(s): PROBNP in the last 8760 hours. HbA1C: No results for input(s): HGBA1C in the last 72 hours. CBG: No results for input(s): GLUCAP in the last 168 hours. Lipid Profile: No results for input(s): CHOL, HDL, LDLCALC, TRIG,  CHOLHDL, LDLDIRECT in the last 72 hours. Thyroid Function Tests: No results for input(s): TSH, T4TOTAL, FREET4, T3FREE, THYROIDAB in the last 72 hours. Anemia Panel: No results for input(s): VITAMINB12, FOLATE, FERRITIN, TIBC, IRON, RETICCTPCT in the last 72 hours. Urine analysis:    Component Value Date/Time   COLORURINE YELLOW 01/05/2010 2000   APPEARANCEUR CLEAR 01/05/2010 2000   LABSPEC 1.027 01/05/2010 2000   PHURINE 8.5 (H) 01/05/2010 2000   GLUCOSEU NEGATIVE 01/05/2010 2000   HGBUR NEGATIVE 01/05/2010 2000   HGBUR negative 12/04/2008 0802   BILIRUBINUR neg 08/25/2015 1206   [place] 01/05/2010 2000   PROTEINUR neg 08/25/2015 1206   PROTEINUR 30 (A) 01/05/2010 2000   UROBILINOGEN 0.2 08/25/2015  1206   UROBILINOGEN 1.0 01/05/2010 2000   NITRITE neg 08/25/2015 1206   NITRITE NEGATIVE 01/05/2010 2000   LEUKOCYTESUR Negative 08/25/2015 1206    Radiological Exams on Admission: Dg Chest Port 1 View  Addendum Date: 04/24/2019   ADDENDUM REPORT: 04/24/2019 22:52 ADDENDUM: ETT position discussed by telephone with Dr. Gerlene Fee on 04/24/2019 at 22:50 hours. Electronically Signed   By: Genevie Ann M.D.   On: 04/24/2019 22:52   Result Date: 04/24/2019 CLINICAL DATA:  63 year old male intubated. Positive for COVID-19. EXAM: PORTABLE CHEST 1 VIEW COMPARISON:  1830 hours today and earlier. FINDINGS: Portable AP supine view at 2236 hours. Endotracheal tube tip in place and just inside the right mainstem bronchus. Retraction of 2-3 centimeters should allow for optimal placement. Increased retrocardiac opacity and air bronchograms probably in part atelectasis. Patchy and indistinct multifocal bilateral pulmonary opacity elsewhere persist. No pleural effusion or pneumothorax. Stable cardiac size and mediastinal contours. IMPRESSION: 1. Right mainstem bronchus intubation. Retraction of 2-3 cm should allow for optimal ETT placement. 2. Associated left lower lobe atelectasis. Otherwise  stable bilateral COVID-19 pneumonia Electronically Signed: By: Genevie Ann M.D. On: 04/24/2019 22:48   Dg Chest Portable 1 View  Result Date: 04/24/2019 CLINICAL DATA:  Shortness of breath, COVID-19 positive EXAM: PORTABLE CHEST 1 VIEW COMPARISON:  02/10/2014 FINDINGS: Multifocal bilateral interstitial and ground-glass opacity. No pleural effusion. Mild cardiomegaly. No pneumothorax. IMPRESSION: Multifocal bilateral interstitial and ground-glass opacity, felt consistent with pneumonia and provided clinical history. Electronically Signed   By: Donavan Foil M.D.   On: 04/24/2019 19:02    EKG: Independently reviewed.  Assessment/Plan Principal Problem:   Acute respiratory distress syndrome (ARDS) due to COVID-19 virus [facility]) Active Problems:   Essential hypertension   Acute respiratory failure with hypoxia (HCC)    1. ARDS due to COVID-19 - patient intubated 1. Vent management, sedation, and ARDS management per PCCM 2. COVID-19 pathway 3. Got solumedrol with EMS, will start Decadron IV tomorrow 4. Remdesivir ordered 5. Actemra or ABx: 1. LFTs slightly elevated, could just be due to COVID, viral hepatitis (A,B,C) negative in 2016 2. CRP still pending 3. WBC 11.3 and procalcitonin of 0.31. 4. Spoke with [facility] Dr. Olevia Bowens, we have decided to initiate ABx coverage for possible superimposed bacterial pneumonia. 5. PCN allergy, dont see where hes been on cephalosporins before in our records so ordering Levaquin 6. No Actemra secondary to possible bacterial PNA 6. Patient may be candidate for convalescent plasma, but will defer this to [facility] docs. 7. CTA chest ordered by EDP and pending 8. Daily CBC, CMP, CRP, D.Dimer 2. HTN - 1. Holding home BP meds for the moment 3. FEN: 1. IVF: 100 cc/hr NS for 1 dose overnight 2. Strict intake and output 3. Dietary consult to start TF  DVT prophylaxis: Lovenox Code Status: Full Family Communication: EDP spoke with family on phone Disposition Plan: TBD  Consults called: PCCM Admission status: Admit to inaptient  Severity of Illness: The appropriate patient status for this patient is INPATIENT. Inpatient status is judged to be reasonable and necessary in order to provide the required intensity of service to ensure the patient's safety. The patient's presenting symptoms, physical exam findings, and initial radiographic and laboratory data in the context of their chronic comorbidities is felt to place them at high risk for further clinical deterioration. Furthermore, it is not anticipated that the patient will be medically stable for discharge from the hospital within 2 midnights of admission. The following factors support the patient  status of inpatient.   IP status for ARDS due to COVID-19, patient is intubated and on a mechanical ventilator.   * I certify that at the point of admission it is my clinical judgment that the patient will require inpatient hospital care spanning beyond 2 midnights from the point of admission due to high intensity of service, high risk for further deterioration and high frequency of surveillance required.*    Lynnzie Blackson M. DO [facility]  How to contact the [facility] Attending or Consulting provider [place] or covering provider during after hours [place], for this patient?  1. Check the care team in [facility] and look for a) attending/consulting [facility] provider listed and b) the [facility] team listed 2. Log into [email or web address]  Amion Physician Scheduling and messaging for groups and whole hospitals  On call and physician scheduling software for group practices, residents, hospitalists and other medical providers for call, clinic, rotation and shift schedules. On[employer] is a hospital-wide system for scheduling doctors and paging doctors on call. EasyPlot is for scientific plotting and data analysis.  [email or web address]  and use [place]'s universal password to access. If you do not have the password, please contact the  hospital operator.  3. Locate the [facility] provider you are looking for under [facility] and page to a number that you can be directly reached. 4. If you still have difficulty reaching the provider, please page the [facility] (Director on Call) for the Hospitalists listed on amion for assistance.  04/24/2019, 11:04 PM

## 2019-04-24 NOTE — ED Notes (Signed)
Admitting provider at bedside.

## 2019-04-24 NOTE — Code Documentation (Signed)
Room and materials prepped, Time out, verbal consent completed.

## 2019-04-24 NOTE — ED Notes (Signed)
OG attempted.

## 2019-04-24 NOTE — ED Notes (Signed)
Pt wife called, I gave her an update and took the phone to Dr.Bero so that he could give additional information.

## 2019-04-24 NOTE — H&P (Signed)
NAME:  Dennis Moore, MRN:  [id number], DOB:  [birth year], LOS: 0 ADMISSION DATE:  04/24/2019, CONSULTATION DATE:  04/24/2019 REFERRING MD:  ED, CHIEF COMPLAINT:  COVID PNA    Brief History   63 yo with positive COVID test  One week ago presented to er with worsening resp status  History of present illness   Patient is a 63 yo male with positive covid test 10/8 due to fever cough sputum production, presented to ER tonight with worsening respiratory status requiring intubation.  CXR reviewed and shows patchy bilateral infiltrates bilaterally.  LDH 380, procalcitonin .31. D Dimer 1.23 and fibrinogen >800.   Past Medical History   Past Medical History:  Diagnosis Date  . Allergic rhinitis   . BPH (benign prostatic hypertrophy)   . Depression   . Erectile dysfunction   . Hyperlipidemia   . Hypertension   . Internal hemorrhoids   . Tubular adenoma of colon 11/2008     Significant Hospital Events   Intubation at admission  Consults:  NA  Procedures:  NA  Significant Diagnostic Tests:  COVID as above  Micro Data:  pnding   Antimicrobials:  levaquin- empiric  Interim history/subjective:  NA  Objective   Blood pressure 108/72, pulse 63, temperature 100.2 F (37.9 C), temperature source Oral, resp. rate 18, height 6' (1.829 m), weight 98 kg, SpO2 94 %.    Vent Mode: PRVC FiO2 (%):  [100 %] 100 % Set Rate:  [18 bmp] 18 bmp Vt Set:  [470 mL] 470 mL PEEP:  [14 cmH20] 14 cmH20  No intake or output data in the 24 hours ending 04/24/19 2351 Filed Weights   04/24/19 1807  Weight: 98 kg    Examination: General: well developed BM sedated ventilated HENT: wnl Lungs: deiminished Cardiovascular: reg Abdomen: bengn Extremities: wnl Neuro: sedated  GU: wnl  Resolved Hospital Problem list   na  Assessment & Plan:  1.  Acute respiratory failure secondary to Covid pneumonia: Patient was started on Decadron, remdesivir as ordered.  She is to be transferred to  [facility].  Further therapy for instance convalescent plasma can be determined there.  Patient is ventilated per protocol.  Blood cultures tracheal aspirate of been ordered.  Empirically has been started on Levaquin.  Currently sedated with fentanyl.  Best practice:  Diet: NPO Pain/Anxiety/Delirium protocol (if indicated): Fentanyl VAP protocol (if indicated): yes DVT prophylaxis: recommend Lovenox 40 mg bid GI prophylaxis: rec pepcid iv Glucose control: monitor Mobility: bedrest Code Status: full Family Communication: Not available Disposition:  To [facility]  Labs   CBC: Recent Labs  Lab 04/24/19 1803 04/24/19 1956 04/24/19 2318  WBC 11.3*  --   --   HGB 11.8* 15.6 11.2*  HCT 32.4* 46.0 33.0*  MCV 86.6  --   --   PLT 300  --   --     Basic Metabolic Panel: Recent Labs  Lab 04/24/19 1803 04/24/19 1956 04/24/19 2318  NA 134* 134* 135  K 3.4* 3.5 3.6  CL 99  --   --   CO2 20*  --   --   GLUCOSE 118*  --   --   BUN 18  --   --   CREATININE 1.36*  --   --   CALCIUM 8.3*  --   --    GFR: Estimated Creatinine Clearance: 67.5 mL/min (A) (by C-G formula based on SCr of 1.36 mg/dL (H)). Recent Labs  Lab 04/24/19 1803 04/24/19 2131  PROCALCITON  --  0.31  WBC 11.3*  --     Liver Function Tests: Recent Labs  Lab 04/24/19 2212  AST 81*  ALT 58*  ALKPHOS 69  BILITOT 0.5  PROT 7.6  ALBUMIN 3.0*   No results for input(s): LIPASE, AMYLASE in the last 168 hours. No results for input(s): AMMONIA in the last 168 hours.  ABG    Component Value Date/Time   PHART 7.300 (L) 04/24/2019 2318   PCO2ART 50.7 (H) 04/24/2019 2318   PO2ART 83.0 04/24/2019 2318   HCO3 24.9 04/24/2019 2318   TCO2 26 04/24/2019 2318   ACIDBASEDEF 2.0 04/24/2019 2318   O2SAT 95.0 04/24/2019 2318     Coagulation Profile: No results for input(s): INR, PROTIME in the last 168 hours.  Cardiac Enzymes: No results for input(s): CKTOTAL, CKMB, CKMBINDEX, TROPONINI in the last 168  hours.  HbA1C: Hgb A1c MFr Bld  Date/Time Value Ref Range Status  12/11/2012 11:10 AM 5.5 4.6 - 6.5 % Final    Comment:    Glycemic Control Guidelines for People with Diabetes:Non Diabetic:  <6%Goal of Therapy: <7%Additional Action Suggested:  >8%   01/16/2008 08:47 AM 5.7 4.6 - 6.0 % Final    Comment:    See lab report for associated comment(s)    CBG: No results for input(s): GLUCAP in the last 168 hours.  Review of Systems:   Unable to obtain, intubated sedated  Past Medical History  He,  has a past medical history of Allergic rhinitis, BPH (benign prostatic hypertrophy), Depression, Erectile dysfunction, Hyperlipidemia, Hypertension, Internal hemorrhoids, and Tubular adenoma of colon (11/2008).   Surgical History    Past Surgical History:  Procedure Laterality Date  . APPENDECTOMY    . CARPAL TUNNEL RELEASE     right  . ELBOW SURGERY     right  . INGUINAL HERNIA REPAIR     age 67     Social History   reports that he has never smoked. He has never used smokeless tobacco. He reports current alcohol use. He reports that he does not use drugs.   Family History   His family history includes Alzheimer's disease in his paternal uncle; Colon cancer in his maternal uncle; Heart disease in his father and mother; Hyperlipidemia in his father; Hypertension in his father; Parkinsonism in his father.   Allergies Allergies  Allergen Reactions  . Penicillins Swelling and Rash     Home Medications  Prior to Admission medications   Medication Sig Start Date End Date Taking? Authorizing Provider  albuterol (PROVENTIL HFA;VENTOLIN HFA) 108 (90 Base) MCG/ACT inhaler Inhale 2 puffs into the lungs every 6 (six) hours as needed for wheezing. 06/23/16   Charlott Rakes, MD  celecoxib (CELEBREX) 200 MG capsule Take 1 capsule (200 mg total) by mouth daily as needed. 08/17/15   Micheline Chapman, NP  cyclobenzaprine (FLEXERIL) 10 MG tablet Take 10 mg by mouth as needed. 06/30/15   [provider]  diazepam (VALIUM) 5 MG tablet Take 1 tablet (5 mg total) by mouth every 8 (eight) hours as needed for muscle spasms. 08/18/14   Roma Schanz R, DO  fenofibrate 160 MG tablet Take 1 tablet (160 mg total) by mouth daily. 02/23/16   Ann Held, DO  lisinopril-hydrochlorothiazide (PRINZIDE,ZESTORETIC) 20-12.5 MG tablet Take 2 tablets by mouth daily. 02/23/16   Ann Held, DO  rosuvastatin (CRESTOR) 20 MG tablet Take 1 tablet (20 mg total) by mouth daily. 02/23/16 02/22/17  Carollee Herter, Yvonne R, DO  sildenafil (VIAGRA) 100 MG tablet Take 0.5 tablets (50 mg total) by mouth daily as needed for erectile dysfunction. 08/17/15   Micheline Chapman, NP  testosterone (TESTIM) 50 MG/5GM (1%) GEL Apply 50 mg topically. 08/03/15   [provider]     Critical care time: 35 minutes critical care time spent in bedside evaluation and chart review along with critical care planning

## 2019-04-25 ENCOUNTER — Inpatient Hospital Stay (HOSPITAL_COMMUNITY): Payer: 59

## 2019-04-25 DIAGNOSIS — U071 COVID-19: Principal | ICD-10-CM

## 2019-04-25 DIAGNOSIS — J9621 Acute and chronic respiratory failure with hypoxia: Secondary | ICD-10-CM | POA: Diagnosis present

## 2019-04-25 DIAGNOSIS — J9601 Acute respiratory failure with hypoxia: Secondary | ICD-10-CM

## 2019-04-25 DIAGNOSIS — F32A Depression, unspecified: Secondary | ICD-10-CM | POA: Diagnosis present

## 2019-04-25 DIAGNOSIS — J80 Acute respiratory distress syndrome: Secondary | ICD-10-CM

## 2019-04-25 DIAGNOSIS — F329 Major depressive disorder, single episode, unspecified: Secondary | ICD-10-CM | POA: Diagnosis present

## 2019-04-25 LAB — COMPREHENSIVE METABOLIC PANEL
ALT: 52 U/L — ABNORMAL HIGH (ref 0–44)
AST: 63 U/L — ABNORMAL HIGH (ref 15–41)
Albumin: 2.9 g/dL — ABNORMAL LOW (ref 3.5–5.0)
Alkaline Phosphatase: 67 U/L (ref 38–126)
Anion gap: 13 (ref 5–15)
BUN: 26 mg/dL — ABNORMAL HIGH (ref 8–23)
CO2: 22 mmol/L (ref 22–32)
Calcium: 8.1 mg/dL — ABNORMAL LOW (ref 8.9–10.3)
Chloride: 101 mmol/L (ref 98–111)
Creatinine, Ser: 1.1 mg/dL (ref 0.61–1.24)
GFR calc Af Amer: >60 mL/min (ref >60)
GFR calc non Af Amer: >60 mL/min (ref >60)
Glucose, Bld: 197 mg/dL — ABNORMAL HIGH (ref 70–99)
Potassium: 3.9 mmol/L (ref 3.5–5.1)
Sodium: 136 mmol/L (ref 135–145)
Total Bilirubin: 0.5 mg/dL (ref 0.3–1.2)
Total Protein: 7.2 g/dL (ref 6.5–8.1)

## 2019-04-25 LAB — CBC WITH DIFFERENTIAL/PLATELET
Abs Immature Granulocytes: 0.09 10*3/uL — ABNORMAL HIGH (ref 0.00–0.07)
Basophils Absolute: 0 10*3/uL (ref 0.0–0.1)
Basophils Relative: 0 %
Eosinophils Absolute: 0 10*3/uL (ref 0.0–0.5)
Eosinophils Relative: 0 %
HCT: 33 % — ABNORMAL LOW (ref 39.0–52.0)
Hemoglobin: 10.9 g/dL — ABNORMAL LOW (ref 13.0–17.0)
Immature Granulocytes: 1 %
Lymphocytes Relative: 5 %
Lymphs Abs: 0.5 10*3/uL — ABNORMAL LOW (ref 0.7–4.0)
MCH: 29.5 pg (ref 26.0–34.0)
MCHC: 33 g/dL (ref 30.0–36.0)
MCV: 89.2 fL (ref 80.0–100.0)
Monocytes Absolute: 0.3 10*3/uL (ref 0.1–1.0)
Monocytes Relative: 3 %
Neutro Abs: 11.1 10*3/uL — ABNORMAL HIGH (ref 1.7–7.7)
Neutrophils Relative %: 91 %
Platelets: 331 10*3/uL (ref 150–400)
RBC: 3.7 MIL/uL — ABNORMAL LOW (ref 4.22–5.81)
RDW: 14.6 % (ref 11.5–15.5)
WBC: 12 10*3/uL — ABNORMAL HIGH (ref 4.0–10.5)
nRBC: 0 % (ref 0.0–0.2)

## 2019-04-25 LAB — FERRITIN: Ferritin: 1976 ng/mL — ABNORMAL HIGH (ref 24–336)

## 2019-04-25 LAB — SARS CORONAVIRUS 2 (TAT 6-24 HRS): SARS Coronavirus 2: POSITIVE — AB

## 2019-04-25 LAB — MAGNESIUM
Magnesium: 2.1 mg/dL (ref 1.7–2.4)
Magnesium: 2.2 mg/dL (ref 1.7–2.4)

## 2019-04-25 LAB — TYPE AND SCREEN
ABO/RH(D): O POS
Antibody Screen: NEGATIVE

## 2019-04-25 LAB — POCT I-STAT 7, (LYTES, BLD GAS, ICA,H+H)
Acid-base deficit: 3 mmol/L — ABNORMAL HIGH (ref 0.0–2.0)
Bicarbonate: 24.5 mmol/L (ref 20.0–28.0)
Calcium, Ion: 1.16 mmol/L (ref 1.15–1.40)
HCT: 33 % — ABNORMAL LOW (ref 39.0–52.0)
Hemoglobin: 11.2 g/dL — ABNORMAL LOW (ref 13.0–17.0)
O2 Saturation: 89 %
Patient temperature: 94.2
Potassium: 3.9 mmol/L (ref 3.5–5.1)
Sodium: 135 mmol/L (ref 135–145)
TCO2: 26 mmol/L (ref 22–32)
pCO2 arterial: 48.9 mmHg — ABNORMAL HIGH (ref 32.0–48.0)
pH, Arterial: 7.295 — ABNORMAL LOW (ref 7.350–7.450)
pO2, Arterial: 56 mmHg — ABNORMAL LOW (ref 83.0–108.0)

## 2019-04-25 LAB — GLUCOSE, CAPILLARY
Glucose-Capillary: 178 mg/dL — ABNORMAL HIGH (ref 70–99)
Glucose-Capillary: 163 mg/dL — ABNORMAL HIGH (ref 70–99)
Glucose-Capillary: 153 mg/dL — ABNORMAL HIGH (ref 70–99)
Glucose-Capillary: 147 mg/dL — ABNORMAL HIGH (ref 70–99)
Glucose-Capillary: 151 mg/dL — ABNORMAL HIGH (ref 70–99)
Glucose-Capillary: 162 mg/dL — ABNORMAL HIGH (ref 70–99)

## 2019-04-25 LAB — D-DIMER, QUANTITATIVE: D-Dimer, Quant: 0.98 ug/mL-FEU — ABNORMAL HIGH (ref 0.00–0.50)

## 2019-04-25 LAB — ABO/RH: ABO/RH(D): O POS

## 2019-04-25 LAB — PROTIME-INR
INR: 1.1 (ref 0.8–1.2)
Prothrombin Time: 14.4 seconds (ref 11.4–15.2)

## 2019-04-25 LAB — C-REACTIVE PROTEIN
CRP: 25.5 mg/dL — ABNORMAL HIGH (ref <1.0)
CRP: 23 mg/dL — ABNORMAL HIGH (ref <1.0)

## 2019-04-25 LAB — PHOSPHORUS
Phosphorus: 3.7 mg/dL (ref 2.5–4.6)
Phosphorus: 2.8 mg/dL (ref 2.5–4.6)

## 2019-04-25 MED ORDER — ENOXAPARIN SODIUM 40 MG/0.4ML ~~LOC~~ SOLN
40.0000 mg | SUBCUTANEOUS | Status: DC
  Administered 2019-04-26 – 2019-05-08 (×13): 40 mg via SUBCUTANEOUS
  Filled 2019-04-25 (×13): qty 0.4

## 2019-04-25 MED ORDER — VANCOMYCIN HCL 10 G IV SOLR
1250.0000 mg | Freq: Two times a day (BID) | INTRAVENOUS | Status: DC
  Administered 2019-04-26 – 2019-04-28 (×5): 1250 mg via INTRAVENOUS
  Filled 2019-04-25 (×6): qty 1250

## 2019-04-25 MED ORDER — DOCUSATE SODIUM 50 MG/5ML PO LIQD: 100.0000 mg | Freq: Two times a day (BID) | ORAL | Status: DC | PRN

## 2019-04-25 MED ORDER — PRO-STAT SUGAR FREE PO LIQD
60.0000 mL | Freq: Three times a day (TID) | ORAL | Status: DC
  Administered 2019-04-25 – 2019-05-03 (×25): 60 mL
  Filled 2019-04-25 (×25): qty 60

## 2019-04-25 MED ORDER — PANTOPRAZOLE SODIUM 40 MG PO PACK
40.0000 mg | PACK | Freq: Every day | ORAL | Status: DC
  Administered 2019-04-25 – 2019-05-03 (×9): 40 mg
  Filled 2019-04-25 (×9): qty 20

## 2019-04-25 MED ORDER — PRO-STAT SUGAR FREE PO LIQD: 30.0000 mL | Freq: Two times a day (BID) | ORAL | Status: DC

## 2019-04-25 MED ORDER — SODIUM CHLORIDE 0.9 % IV SOLN
INTRAVENOUS | Status: DC
  Administered 2019-04-25 – 2019-05-01 (×2): via INTRAVENOUS

## 2019-04-25 MED ORDER — CHLORHEXIDINE GLUCONATE CLOTH 2 % EX PADS
6.0000 | MEDICATED_PAD | Freq: Every day | CUTANEOUS | Status: DC
  Administered 2019-04-25 – 2019-05-08 (×13): 6 via TOPICAL

## 2019-04-25 MED ORDER — SODIUM CHLORIDE 0.9 % IV SOLN
100.0000 mg | INTRAVENOUS | Status: AC
  Administered 2019-04-26 – 2019-04-29 (×4): 100 mg via INTRAVENOUS
  Filled 2019-04-25 (×4): qty 20

## 2019-04-25 MED ORDER — MIDAZOLAM BOLUS VIA INFUSION
1.0000 mg | INTRAVENOUS | Status: DC | PRN
  Administered 2019-04-25: 1 mg via INTRAVENOUS
  Administered 2019-04-26 (×2): 2 mg via INTRAVENOUS
  Administered 2019-04-28: 1 mg via INTRAVENOUS
  Administered 2019-04-29 – 2019-04-30 (×5): 2 mg via INTRAVENOUS
  Filled 2019-04-25: qty 2

## 2019-04-25 MED ORDER — FENTANYL CITRATE (PF) 100 MCG/2ML IJ SOLN: 50.0000 ug | Freq: Once | INTRAMUSCULAR | Status: DC

## 2019-04-25 MED ORDER — FENTANYL 2500MCG IN NS 250ML (10MCG/ML) PREMIX INFUSION
50.0000 ug/h | INTRAVENOUS | Status: DC
  Administered 2019-04-25: 375 ug/h via INTRAVENOUS
  Administered 2019-04-25 – 2019-04-27 (×4): 200 ug/h via INTRAVENOUS
  Administered 2019-04-28 – 2019-04-29 (×2): 125 ug/h via INTRAVENOUS
  Administered 2019-04-30 (×2): 150 ug/h via INTRAVENOUS
  Administered 2019-05-01: 50 ug/h via INTRAVENOUS
  Administered 2019-05-02: 175 ug/h via INTRAVENOUS
  Filled 2019-04-25 (×11): qty 250

## 2019-04-25 MED ORDER — CHLORHEXIDINE GLUCONATE 0.12 % MT SOLN
15.0000 mL | Freq: Two times a day (BID) | OROMUCOSAL | Status: DC
  Administered 2019-04-25 – 2019-05-08 (×27): 15 mL via OROMUCOSAL
  Filled 2019-04-25 (×17): qty 15

## 2019-04-25 MED ORDER — SODIUM CHLORIDE 0.9 % IV SOLN
200.0000 mg | Freq: Once | INTRAVENOUS | Status: AC
  Administered 2019-04-25: 200 mg via INTRAVENOUS
  Filled 2019-04-25: qty 40

## 2019-04-25 MED ORDER — ORAL CARE MOUTH RINSE
15.0000 mL | OROMUCOSAL | Status: DC
  Administered 2019-04-25 – 2019-05-03 (×80): 15 mL via OROMUCOSAL

## 2019-04-25 MED ORDER — BISACODYL 10 MG RE SUPP
10.0000 mg | Freq: Every day | RECTAL | Status: DC | PRN
  Administered 2019-04-27: 10 mg via RECTAL
  Filled 2019-04-25 (×2): qty 1

## 2019-04-25 MED ORDER — SODIUM CHLORIDE 0.9% IV SOLUTION: Freq: Once | INTRAVENOUS | Status: DC

## 2019-04-25 MED ORDER — VITAL HIGH PROTEIN PO LIQD: 1000.0000 mL | ORAL | Status: DC

## 2019-04-25 MED ORDER — MIDAZOLAM 50MG/50ML (1MG/ML) PREMIX INFUSION: 0.5000 mg/h | INTRAVENOUS | Status: DC

## 2019-04-25 MED ORDER — FAMOTIDINE 40 MG/5ML PO SUSR: 20.0000 mg | Freq: Two times a day (BID) | ORAL | Status: DC

## 2019-04-25 MED ORDER — SODIUM CHLORIDE 0.9 % IV SOLN
Freq: Once | INTRAVENOUS | Status: AC
  Administered 2019-04-25: 04:00:00 via INTRAVENOUS

## 2019-04-25 MED ORDER — MIDAZOLAM 50MG/50ML (1MG/ML) PREMIX INFUSION
0.0000 mg/h | INTRAVENOUS | Status: DC
  Administered 2019-04-25: 3 mg/h via INTRAVENOUS
  Administered 2019-04-26: 5 mg/h via INTRAVENOUS
  Administered 2019-04-26: 4 mg/h via INTRAVENOUS
  Administered 2019-04-26: 5 mg/h via INTRAVENOUS
  Administered 2019-04-27: 02:00:00 8 mg/h via INTRAVENOUS
  Administered 2019-04-27: 4 mg/h via INTRAVENOUS
  Administered 2019-04-27 (×2): 6 mg/h via INTRAVENOUS
  Administered 2019-04-29: 4 mg/h via INTRAVENOUS
  Administered 2019-04-29: 3 mg/h via INTRAVENOUS
  Administered 2019-04-30 (×3): 5 mg/h via INTRAVENOUS
  Filled 2019-04-25 (×15): qty 50

## 2019-04-25 MED ORDER — CHLORHEXIDINE GLUCONATE 0.12% ORAL RINSE (MEDLINE KIT): 15.0000 mL | Freq: Two times a day (BID) | OROMUCOSAL | Status: DC

## 2019-04-25 MED ORDER — ENOXAPARIN SODIUM 40 MG/0.4ML ~~LOC~~ SOLN: 40.0000 mg | Freq: Two times a day (BID) | SUBCUTANEOUS | Status: DC

## 2019-04-25 MED ORDER — VANCOMYCIN HCL 10 G IV SOLR
2000.0000 mg | Freq: Once | INTRAVENOUS | Status: AC
  Administered 2019-04-25: 2000 mg via INTRAVENOUS
  Filled 2019-04-25: qty 2000

## 2019-04-25 MED ORDER — FENTANYL BOLUS VIA INFUSION
50.0000 ug | INTRAVENOUS | Status: DC | PRN
  Administered 2019-04-28 – 2019-04-30 (×10): 50 ug via INTRAVENOUS
  Filled 2019-04-25: qty 50

## 2019-04-25 MED ORDER — IPRATROPIUM-ALBUTEROL 0.5-2.5 (3) MG/3ML IN SOLN
3.0000 mL | Freq: Four times a day (QID) | RESPIRATORY_TRACT | Status: DC
  Administered 2019-04-25 – 2019-04-26 (×3): 3 mL via RESPIRATORY_TRACT
  Filled 2019-04-25 (×3): qty 3

## 2019-04-25 MED ORDER — IPRATROPIUM-ALBUTEROL 20-100 MCG/ACT IN AERS
1.0000 | INHALATION_SPRAY | Freq: Four times a day (QID) | RESPIRATORY_TRACT | Status: DC
  Filled 2019-04-25: qty 4

## 2019-04-25 MED ORDER — VITAL 1.5 CAL PO LIQD
1000.0000 mL | ORAL | Status: DC
  Administered 2019-04-25 – 2019-05-02 (×9): 1000 mL
  Filled 2019-04-25 (×9): qty 1000

## 2019-04-25 NOTE — Progress Notes (Addendum)
Spoke with patient's wife and daughter via phone conference. All questions and concerns addressed. Updated on patient's current condition. Family pleased with update.  Patient's wife gave the daughter permission to call and obtain patient information.

## 2019-04-25 NOTE — Progress Notes (Signed)
Head turned per protocol.  No signs of breakdown noted.  Patient tolerated well.

## 2019-04-25 NOTE — Progress Notes (Signed)
Pt head repositioned as per proning protocol. Proning pillow in place and no break-down noted.  Pt tolerated moving well. ET tube continues to be secure with tape. Will continue to follow progress.

## 2019-04-25 NOTE — Progress Notes (Signed)
Initial Nutrition Assessment  DOCUMENTATION CODES:   Not applicable  INTERVENTION:   Initiate Vital 1.5 @ 20 ml/hr and increase by 10 ml every 8 hours to goal rate of 40 ml/hr  60 ml Prostat TID  Provides: 2040 kcal, 154 grams protein, and 733 ml free water. 10   NUTRITION DIAGNOSIS:   Increased nutrient needs related to (COVID PNA) as evidenced by estimated needs.  GOAL:   Patient will meet greater than or equal to 90% of their needs  MONITOR:   TF tolerance, Vent status  REASON FOR ASSESSMENT:   Consult, Ventilator Enteral/tube feeding initiation and management  ASSESSMENT:   Pt with PMH of depression, HLD, and HTN who recently tested positive for COVID 10/8 now admitted with acute respiratory failure due to COVID+ PNA  Patient is currently intubated on ventilator support MV: 11.1 L/min Temp (24hrs), Avg:97 F (36.1 C), Min:94.2 F (34.6 C), Max:100.2 F (37.9 C)  Medications reviewed and include: decadron, fentanyl, versed, remdesivir  IVF: NS @ 125 ml/hr Labs reviewed: CRP: 25.5 (H)    NUTRITION - FOCUSED PHYSICAL EXAM:  Deferred; RD working remotely   Diet Order:   Diet Order            Diet NPO time specified  Diet effective now              EDUCATION NEEDS:   No education needs have been identified at this time  Skin:  Skin Assessment: Reviewed RN Assessment  Last BM:  unknown  Height:   Ht Readings from Last 1 Encounters:  04/24/19 6' (1.829 m)    Weight:   Wt Readings from Last 1 Encounters:  04/24/19 98 kg    Ideal Body Weight:  80.9 kg  BMI:  Body mass index is 29.29 kg/m.  Estimated Nutritional Needs:   Kcal:  1960 (20 kcal/kg)  Protein:  >147 grams  Fluid:  2 L/day  Maylon Peppers RD, LDN, CNSC [phone] Pager [phone] After Hours Pager

## 2019-04-25 NOTE — Research (Signed)
[place] PACT Trial Informed Consent   Subject Name: Dennis Moore  Subject met inclusion and exclusion criteria.  The informed consent form, study requirements and expectations were reviewed with the subject and questions and concerns were addressed prior to the signing of the consent form.  The subject verbalized understanding of the trail requirements.  The subject agreed to participate in the [place] PACT  trial and signed the informed consent.  The informed consent was obtained prior to performance of any protocol-specific procedures for the subject.  A copy of the signed informed consent was given to the subject and a copy was placed in the subject's medical record.  Philemon Kingdom D 04/25/2019, 1715 pm

## 2019-04-25 NOTE — ED Notes (Signed)
Reported to [place] provider that pt is getting 329mcg of fentanyl, had 2mg  of Versed after intubation. Pt has had total of three 150 mcg boluses. He appears synchronous with the vent. He is not tolerating getting the OG, I was reluctant to give more versed as his bp has decreased bp.

## 2019-04-25 NOTE — ED Notes (Signed)
[facility] arrived to transport pt to [facility]

## 2019-04-25 NOTE — ED Notes (Signed)
Attempted to waste 1mg 14ml of versed with [facility] paramedic Julien Girt. He did not have sufficient privileges. Medication administered byTiffany Judy Pimple, RN and noted in chart. Wasted with co-work Kassie Mends in pyxis

## 2019-04-25 NOTE — ED Notes (Signed)
[facility] administered a 171mcg fentanyl bolus.

## 2019-04-25 NOTE — ED Notes (Signed)
Dr. Mariane Masters assessed the pt. He said that the OG and temp foley is to be placed at [facility]. This RN has attempted OG x 2 unsuccessfully. Dr Mariane Masters said pt is stable and ready for transport to [facility].

## 2019-04-25 NOTE — Progress Notes (Signed)
Patient placed in prone position per MD order.  ETT secured with cloth tape, and prophylactic dressings placed on cheeks, above the mouth, and forehead.  Patient tolerated well.

## 2019-04-25 NOTE — Progress Notes (Signed)
Pharmacy Brief Note   O:  ALT: 58  CXR: Multifocal bilateral interstitial and ground-glass opacity, felt consistent with pneumonia and provided clinical history   SpO2: pt intubated   A/P:  Patient meets requirements for remdesivir therapy.  Will start  remdesivir 200 mg IV x 1  followed by 100 mg IV daily x 4 days.  Monitor ALT  Royetta Asal, PharmD, BCPS Pager [phone] 04/25/2019 5:42 AM

## 2019-04-25 NOTE — Progress Notes (Signed)
PHARMACY - PHYSICIAN COMMUNICATION CRITICAL VALUE ALERT - BLOOD CULTURE IDENTIFICATION (BCID)  Dennis Moore is an 63 y.o. male who presented to [facility] on 04/24/2019 with a chief complaint of SOB, respiratory distress.  Assessment:  Pt has been intubated due to ARDS.  Name of physician (or Provider) Contacted: Dr. Shanon Brow  Current antibiotics: none  Changes to prescribed antibiotics recommended:  As pt is intubated and unclear what is organism currently, start vancomycin   Vancomycin 2000 mg IV x1, then vancomycin 1250 mg IV q12h.   No results found for this or any previous visit.    Royetta Asal, PharmD, BCPS 04/25/2019 11:02 PM

## 2019-04-25 NOTE — Progress Notes (Addendum)
PROGRESS NOTE    Dennis Moore  [id number] DOB: 1955-08-22 DOA: 04/24/2019 PCP: Ann Held, DO   Brief Narrative:  63 y.o. BM PMHx depression, HTN.,  HLD, BPH   From PCP records I can see that the patient called 10/7 with fevers and cough onset 10/6.  He was tested for COVID on 10/8 and the test came back positive (RT PCR, result visible to [place] under Care Everywhere > Lab results [place] > Viral tests).  Patient presented to the ED with worsening SOB, respiratory distress.  Was satting 73s on RA  Per EDP note: "Has had progressively worsening cough, now with shortness of breath for the past 2 or 3 days. Denies chest pain. Intermittent fevers. No abdominal pain. No vomiting, no diarrhea. Symptoms constant, moderate in severity, no exacerbating or alleviating factors."  ED Course: Sats were still 89% on NRB, tachypnic with RR 30-40s.  CXR showing multifocal bilateral PNA c/w COVID  EDP made decision to intubate patient and patient was intubated.  Hospitalist has been called to admit patient over to [facility] and PCCM has been consulted.  Subjective: 10/15 intubated and sedated   Assessment & Plan:   Principal Problem:   Acute respiratory distress syndrome (ARDS) due to COVID-19 virus [facility]) Active Problems:   Essential hypertension   Acute respiratory failure with hypoxia (HCC)   Depression   Acute on chronic respiratory failure with hypoxia (HCC)  Acute respiratory failure with hypoxia/Covid 19 pneumonia -Remdesivir per pharmacy protocol -Decadron 6 mg daily -Spoke with Mrs. Poague and counseled her on the use of Covid convalescent plasma.  Informed not approving therapy for treatment of Covid 19 although there is some data showing benefit from its use.  She has agreed to sign consent form and is on her way to the hospital as we speak. -10/15 transfuse 1 unit Covid convalescent plasma; once paperwork signed -Patient with leukocytosis,  possible bacterial infection hold on administration of Actemra -Prone patient for 16 hours/day, 8 hours off -Combivent QID -Daily Covid labs pending -Adjust vent settings to remain within ARDS net guidelines  ARDS -See Covid pneumonia  Essential HTN -Patient borderline hypotensive BP medication -Would start Levophed PRN to maintain MAP>65  Depression -Patient currently sedated and intubated not an issue     DVT prophylaxis: Lovenox--> Covid Pact trial Code Status: Full Family Communication: 10/15 spoke at length with Ms. Arntz concerning plan of care.  Requested that she allow [place] to transfuse CCP, and she agreed will drive to hospital to sign paperwork.  Also spoke with her at length concerning Covid PACT trial, and she agreed that she would like for patient to be placed in trial. Disposition Plan: TBD   Consultants:  PCCM     Procedures/Significant Events:  10/15 PCXR; worsening multifocal pneumonia     I have personally reviewed and interpreted all radiology studies and my findings are as above.  VENTILATOR SETTINGS: Vent mode; PRVC Set rate; 24 Vt Set; 470 FiO2; 80% PEEP; 16   Cultures   Antimicrobials: Anti-infectives (From admission, onward)   Start     Stop   04/26/19 1000  remdesivir 100 mg in sodium chloride 0.9 % 250 mL IVPB     04/30/19 0959   04/25/19 0630  remdesivir 200 mg in sodium chloride 0.9 % 250 mL IVPB     04/25/19 0728   04/25/19 0000  levofloxacin (LEVAQUIN) IVPB 750 mg  Status:  Discontinued     04/25/19 1153  Devices    LINES / TUBES:      Continuous Infusions: . sodium chloride    . sodium chloride Stopped (04/25/19 1453)  . feeding supplement (VITAL 1.5 CAL) 1,000 mL (04/25/19 1051)  . fentaNYL infusion INTRAVENOUS 300 mcg/hr (04/25/19 1500)  . midazolam 3 mg/hr (04/25/19 1500)  . [START ON 04/26/2019] remdesivir 100 mg in NS 250 mL       Objective: Vitals:   04/25/19 1400 04/25/19 1530 04/25/19 1600  04/25/19 1700  BP: (!) 98/58 100/60 102/61 101/73  Pulse: (!) 52 (!) 59 (!) 56 (!) 58  Resp: (!) 24 (!) 24 (!) 24 (!) 24  Temp:   (!) 97.5 F (36.4 C)   TempSrc:   Oral   SpO2: 98% 97% 97% 97%  Weight:      Height:        Intake/Output Summary (Last 24 hours) at 04/25/2019 1817 Last data filed at 04/25/2019 1500 Gross per 24 hour  Intake 2677.17 ml  Output 550 ml  Net 2127.17 ml   Filed Weights   04/24/19 1807  Weight: 98 kg    Examination:  General: Sedated/intubated positive acute respiratory distress Eyes: negative scleral hemorrhage, negative anisocoria, negative icterus ENT: Negative Runny nose, negative gingival bleeding, #7.5 ETT tube Neck:  Negative scars, masses, torticollis, lymphadenopathy, JVD Lungs: Clear to auscultation bilaterally without wheezes or crackles Cardiovascular: Regular rate and rhythm without murmur gallop or rub normal S1 and S2 Abdomen: negative abdominal pain, nondistended, positive soft, bowel sounds, no rebound, no ascites, no appreciable mass Extremities: No significant cyanosis, clubbing, or edema bilateral lower extremities Skin: Negative rashes, lesions, ulcers Psychiatric: Sedated/intubated  Central nervous system: Sedated/intubated  .     Data Reviewed: Care during the described time interval was provided by me .  I have reviewed this patient's available data, including medical history, events of note, physical examination, and all test results as part of my evaluation.   CBC: Recent Labs  Lab 04/24/19 1803 04/24/19 1956 04/24/19 2318 04/25/19 0510 04/25/19 0615  WBC 11.3*  --   --   --  12.0*  NEUTROABS  --   --   --   --  11.1*  HGB 11.8* 15.6 11.2* 11.2* 10.9*  HCT 32.4* 46.0 33.0* 33.0* 33.0*  MCV 86.6  --   --   --  89.2  PLT 300  --   --   --  [id number]   Basic Metabolic Panel: Recent Labs  Lab 04/24/19 1803 04/24/19 1956 04/24/19 2318 04/25/19 0510 04/25/19 0615  NA 134* 134* 135 135 136  K 3.4* 3.5 3.6 3.9  3.9  CL 99  --   --   --  101  CO2 20*  --   --   --  22  GLUCOSE 118*  --   --   --  197*  BUN 18  --   --   --  26*  CREATININE 1.36*  --   --   --  1.10  CALCIUM 8.3*  --   --   --  8.1*  MG  --   --   --   --  2.1  PHOS  --   --   --   --  3.7   GFR: Estimated Creatinine Clearance: 83.4 mL/min (by C-G formula based on SCr of 1.1 mg/dL). Liver Function Tests: Recent Labs  Lab 04/24/19 2212 04/25/19 0615  AST 81* 63*  ALT 58* 52*  ALKPHOS 69 67  BILITOT 0.5 0.5  PROT 7.6 7.2  ALBUMIN 3.0* 2.9*   No results for input(s): LIPASE, AMYLASE in the last 168 hours. No results for input(s): AMMONIA in the last 168 hours. Coagulation Profile: No results for input(s): INR, PROTIME in the last 168 hours. Cardiac Enzymes: No results for input(s): CKTOTAL, CKMB, CKMBINDEX, TROPONINI in the last 168 hours. BNP (last 3 results) No results for input(s): PROBNP in the last 8760 hours. HbA1C: No results for input(s): HGBA1C in the last 72 hours. CBG: Recent Labs  Lab 04/25/19 0713 04/25/19 0848 04/25/19 1325  GLUCAP 178* 163* 153*   Lipid Profile: Recent Labs    04/24/19 2131  TRIG 194*   Thyroid Function Tests: No results for input(s): TSH, T4TOTAL, FREET4, T3FREE, THYROIDAB in the last 72 hours. Anemia Panel: Recent Labs    04/24/19 2131  FERRITIN 1,976*   Urine analysis:    Component Value Date/Time   COLORURINE YELLOW 01/05/2010 2000   APPEARANCEUR CLEAR 01/05/2010 2000   LABSPEC 1.027 01/05/2010 2000   PHURINE 8.5 (H) 01/05/2010 2000   GLUCOSEU NEGATIVE 01/05/2010 2000   HGBUR NEGATIVE 01/05/2010 2000   HGBUR negative 12/04/2008 0802   BILIRUBINUR neg 08/25/2015 1206   KETONESUR NEGATIVE 01/05/2010 2000   PROTEINUR neg 08/25/2015 1206   PROTEINUR 30 (A) 01/05/2010 2000   UROBILINOGEN 0.2 08/25/2015 1206   UROBILINOGEN 1.0 01/05/2010 2000   NITRITE neg 08/25/2015 1206   NITRITE NEGATIVE 01/05/2010 2000   LEUKOCYTESUR Negative 08/25/2015 1206   Sepsis  Labs: @LABRCNTIP (procalcitonin:4,lacticidven:4)  ) Recent Results (from the past 240 hour(s))  SARS CORONAVIRUS 2 (TAT 6-24 HRS)     Status: Abnormal   Collection Time: 04/24/19  7:06 PM  Result Value Ref Range Status   SARS Coronavirus 2 POSITIVE (A) NEGATIVE Final    Comment: RESULT CALLED TO, READ BACK BY AND VERIFIED WITH: RN H BILLITER @ [phone] 04/25/19 BY S GEZAHEGN (NOTE) SARS-CoV-2 target nucleic acids are DETECTED. The SARS-CoV-2 RNA is generally detectable in upper and lower respiratory specimens during the acute phase of infection. Positive results are indicative of active infection with SARS-CoV-2. Clinical  correlation with patient history and other diagnostic information is necessary to determine patient infection status. Positive results do  not rule out bacterial infection or co-infection with other viruses. The expected result is Negative. Fact Sheet for Patients: SugarRoll.be Fact Sheet for Healthcare Providers: https://www.woods-mathews.com/ This test is not yet approved or cleared by the [place] FDA and  has been authorized for detection and/or diagnosis of SARS-CoV-2 by FDA under an Emergency Use Authorization (EUA). This EUA will remain  in effect (meaning this test can be use d) for the duration of the COVID-19 declaration under Section 564(b)(1) of the Act, 21 U.S.C. section 360bbb-3(b)(1), unless the authorization is terminated or revoked sooner. Performed at [facility], [place] [address] [place], [place] [address]   Culture, blood (single) w Reflex to ID Panel     Status: None (Preliminary result)   Collection Time: 04/24/19 10:00 PM   Specimen: BLOOD LEFT FOREARM  Result Value Ref Range Status   Specimen Description BLOOD LEFT FOREARM  Final   Special Requests   Final    BOTTLES DRAWN AEROBIC AND ANAEROBIC Blood Culture adequate volume   Culture   Final    NO GROWTH < 12 HOURS Performed at [facility], [place] [address], [place], [place] [address]    Report Status PENDING  Incomplete  Radiology Studies: Dg Chest Port 1 View  Result Date: 04/25/2019 CLINICAL DATA:  COVID-19 positive patient who is intubated. Repositioning of endotracheal tube. EXAM: PORTABLE CHEST 1 VIEW COMPARISON:  Single-view of the chest 04/23/2014. FINDINGS: Endotracheal tube has been pulled back with the tip now in good position at the level of the clavicular heads. NG tube courses into the stomach and below the inferior margin of the film. Extensive bilateral airspace disease has worsened. Heart size is upper normal. No pneumothorax. IMPRESSION: ETT is in good position. Worsened multifocal pneumonia. Electronically Signed   By: Inge Rise M.D.   On: 04/25/2019 11:29   Dg Chest Port 1 View  Addendum Date: 04/24/2019   ADDENDUM REPORT: 04/24/2019 22:52 ADDENDUM: ETT position discussed by telephone with Dr. Gerlene Fee on 04/24/2019 at 22:50 hours. Electronically Signed   By: Genevie Ann M.D.   On: 04/24/2019 22:52   Result Date: 04/24/2019 CLINICAL DATA:  63 year old male intubated. Positive for COVID-19. EXAM: PORTABLE CHEST 1 VIEW COMPARISON:  1830 hours today and earlier. FINDINGS: Portable AP supine view at 2236 hours. Endotracheal tube tip in place and just inside the right mainstem bronchus. Retraction of 2-3 centimeters should allow for optimal placement. Increased retrocardiac opacity and air bronchograms probably in part atelectasis. Patchy and indistinct multifocal bilateral pulmonary opacity elsewhere persist. No pleural effusion or pneumothorax. Stable cardiac size and mediastinal contours. IMPRESSION: 1. Right mainstem bronchus intubation. Retraction of 2-3 cm should allow for optimal ETT placement. 2. Associated left lower lobe atelectasis. Otherwise stable bilateral COVID-19 pneumonia Electronically Signed: By: Genevie Ann M.D. On: 04/24/2019 22:48   Dg Chest Portable 1 View  Result  Date: 04/24/2019 CLINICAL DATA:  Shortness of breath, COVID-19 positive EXAM: PORTABLE CHEST 1 VIEW COMPARISON:  02/10/2014 FINDINGS: Multifocal bilateral interstitial and ground-glass opacity. No pleural effusion. Mild cardiomegaly. No pneumothorax. IMPRESSION: Multifocal bilateral interstitial and ground-glass opacity, felt consistent with pneumonia and provided clinical history. Electronically Signed   By: Donavan Foil M.D.   On: 04/24/2019 19:02        Scheduled Meds: . sodium chloride   Intravenous Once  . chlorhexidine  15 mL Mouth/Throat BID  . Chlorhexidine Gluconate Cloth  6 each Topical Daily  . dexamethasone (DECADRON) injection  6 mg Intravenous Q24H  . enoxaparin (LOVENOX) injection  40 mg Subcutaneous Q12H  . etomidate  30 mg Intravenous Once  . feeding supplement (PRO-STAT SUGAR FREE 64)  60 mL Per Tube TID  . fentaNYL (SUBLIMAZE) injection  50 mcg Intravenous Once  . insulin aspart  0-9 Units Subcutaneous Q4H  . ipratropium-albuterol  3 mL Nebulization Q6H  . mouth rinse  15 mL Mouth Rinse 10 times per day  . pantoprazole sodium  40 mg Per Tube Daily  . succinylcholine  150 mg Intravenous Once   Continuous Infusions: . sodium chloride    . sodium chloride Stopped (04/25/19 1453)  . feeding supplement (VITAL 1.5 CAL) 1,000 mL (04/25/19 1051)  . fentaNYL infusion INTRAVENOUS 300 mcg/hr (04/25/19 1500)  . midazolam 3 mg/hr (04/25/19 1500)  . [START ON 04/26/2019] remdesivir 100 mg in NS 250 mL       LOS: 1 day   The patient is critically ill with multiple organ systems failure and requires high complexity decision making for assessment and support, frequent evaluation and titration of therapies, application of advanced monitoring technologies and extensive interpretation of multiple databases. Critical Care Time devoted to patient care services described in this note  Time spent: 40 minutes  Allie Bossier, MD [facility] Pager [phone]  If  7PM-7AM, please contact night-coverage [email or web address] Password [facility] 04/25/2019, 6:17 PM

## 2019-04-25 NOTE — ED Notes (Signed)
Dr. Mariane Masters from [place] at bedside.

## 2019-04-25 NOTE — Research (Signed)
   YES NO INCLUSION  [x] [] Age ?18 years (male or male)  [x] [] Acute infection with severe acute respiratory syndrome coronarvirus 2 (SARS-CoV2)  [x] [] Currently admitted to an ICU 1. Where ICU admission occurred < 72 hours prior to randomization.   YES NO EXCLUSION  [] [x] Ongoing (>48hours) or planned full-dose (therapeutic) anticoagulation for any indication  [] [x] Ongoing or planned treatment with dual antiplatelet therapy  [] [x] Contraindication to antithrombotic therapy or high risk of bleeding due to conditions including, but not limited to, any of the following:  [] [x] a) History of intracranial hemorrhage, known CNS tumor        or CNS vascular abnormality  [] [x]  b) Active or recent major bleeding within the past 30 days with untreated source  [] [x] c) Platelet count <70,000 or known functional platelet disorder  [] [x]  d) Fibrinogen <200 mg/dL  [] [x] e) International normalized ratio (INR) >1.9  [] [x] History of heparin-induced thrombocytopenia  [] [x]  Ischemic stroke within the past 2 weeks  [] [x] Pregnancy  [] [x]  Study center employees or their family members  [] [x]  Any condition which in the investigator's assessment might increase the risk to the patient or decrease the chance of obtaining satisfactory data to achieve the objectives of the study  [] [x]  Subjects for whom further care is being forgone at the decision of the subject, family, and/or treating team ("comfort measures only")    

## 2019-04-25 NOTE — Progress Notes (Signed)
NAME:  Dennis Moore, MRN:  [id number], DOB:  [birth year], LOS: 1 ADMISSION DATE:  04/24/2019, CONSULTATION DATE:  10/14 REFERRING MD:  Alcario Drought, CHIEF COMPLAINT:  dyspnea   Brief History   63 year old male admitted with ARDS from COVID-19 pneumonia on April 24, 2019.   Past Medical History  BPH Depression Hyperlipidemia Hypertension  Significant Hospital Events   10/14 intubated ED, admission  Consults:  PCCM  Procedures:  10/14 ETT >   Significant Diagnostic Tests:    Micro Data:  10/14 blood >  10/14 SARS COV 2> 10/14 resp cx >   Antimicrobials:  10/14 remdesivir >  10/14 decadron >   Interim history/subjective:  As above  Objective   Blood pressure (!) 86/50, pulse (!) 49, temperature (!) 96.5 F (35.8 C), temperature source Axillary, resp. rate (!) 24, height 6' (1.829 m), weight 98 kg, SpO2 98 %.    Vent Mode: PRVC FiO2 (%):  [100 %] 100 % Set Rate:  [18 bmp-24 bmp] 24 bmp Vt Set:  [470 mL] 470 mL PEEP:  [14 cmH20-16 cmH20] 16 cmH20 Plateau Pressure:  [26 cmH20] 26 cmH20   Intake/Output Summary (Last 24 hours) at 04/25/2019 0751 Last data filed at 04/25/2019 0236 Gross per 24 hour  Intake 148.45 ml  Output -  Net 148.45 ml   Filed Weights   04/24/19 1807  Weight: 98 kg    Examination:  General:  In bed on vent HENT: NCAT ETT in place PULM: CTA B, vent supported breathing CV: RRR, no mgr GI: belly distended, BS+, soft, nontender MSK: normal bulk and tone Neuro: sedated on vent   October 14 chest x-ray images independently reviewed showing bilateral patchy airspace disease, endotracheal tube with tip at [place]ina.  Resolved Hospital Problem list     Assessment & Plan:  ARDS due to COVID 19 pneumonia: severe Continue mechanical ventilation per ARDS protocol Target TVol 6-8cc/kgIBW Target Plateau Pressure < 30cm H20 Target driving pressure less than 15 cm of water Target PaO2 55-65: titrate PEEP/FiO2 per protocol As long as PaO2  to FiO2 ratio is less than 1:150 position in prone position for 16 hours a day Check CVP daily if CVL in place Target CVP less than 4, diurese as necessary Ventilator associated pneumonia prevention protocol 10/15 plan: start prone positioning today  Need for sedation for mechanical ventilation RASS goal -3 Start PAD protocol, fentanyl, versed infusions     Best practice:  Diet: start tube feeding Pain/Anxiety/Delirium protocol (if indicated): yes, start today, RASS target -3 VAP protocol (if indicated): yes DVT prophylaxis: discuss enrolment in [place] PACT study with family, in meantime use lovenox 0.5mg /kg sub cutaneous q12h per protocol GI prophylaxis: Pantoprazole for stress ulcer prophylaxis Glucose control: SSI Mobility: bed rest Code Status: full Family Communication: per TRH Disposition: remain in ICU  Labs   CBC: Recent Labs  Lab 04/24/19 1803 04/24/19 1956 04/24/19 2318  WBC 11.3*  --   --   HGB 11.8* 15.6 11.2*  HCT 32.4* 46.0 33.0*  MCV 86.6  --   --   PLT 300  --   --     Basic Metabolic Panel: Recent Labs  Lab 04/24/19 1803 04/24/19 1956 04/24/19 2318  NA 134* 134* 135  K 3.4* 3.5 3.6  CL 99  --   --   CO2 20*  --   --   GLUCOSE 118*  --   --   BUN 18  --   --   CREATININE 1.36*  --   --  CALCIUM 8.3*  --   --    GFR: Estimated Creatinine Clearance: 67.5 mL/min (A) (by C-G formula based on SCr of 1.36 mg/dL (H)). Recent Labs  Lab 04/24/19 1803 04/24/19 2131  PROCALCITON  --  0.31  WBC 11.3*  --     Liver Function Tests: Recent Labs  Lab 04/24/19 2212  AST 81*  ALT 58*  ALKPHOS 69  BILITOT 0.5  PROT 7.6  ALBUMIN 3.0*   No results for input(s): LIPASE, AMYLASE in the last 168 hours. No results for input(s): AMMONIA in the last 168 hours.  ABG    Component Value Date/Time   PHART 7.300 (L) 04/24/2019 2318   PCO2ART 50.7 (H) 04/24/2019 2318   PO2ART 83.0 04/24/2019 2318   HCO3 24.9 04/24/2019 2318   TCO2 26 04/24/2019 2318    ACIDBASEDEF 2.0 04/24/2019 2318   O2SAT 95.0 04/24/2019 2318     Coagulation Profile: No results for input(s): INR, PROTIME in the last 168 hours.  Cardiac Enzymes: No results for input(s): CKTOTAL, CKMB, CKMBINDEX, TROPONINI in the last 168 hours.  HbA1C: Hgb A1c MFr Bld  Date/Time Value Ref Range Status  12/11/2012 11:10 AM 5.5 4.6 - 6.5 % Final    Comment:    Glycemic Control Guidelines for People with Diabetes:Non Diabetic:  <6%Goal of Therapy: <7%Additional Action Suggested:  >8%   01/16/2008 08:47 AM 5.7 4.6 - 6.0 % Final    Comment:    See lab report for associated comment(s)    CBG: Recent Labs  Lab 04/25/19 0713  GLUCAP 178*     Critical care time: 40 minutes      Roselie Awkward, MD [place] [facility] Pager: [phone] Cell: [phone] If no response, call [phone]

## 2019-04-26 DIAGNOSIS — J9621 Acute and chronic respiratory failure with hypoxia: Secondary | ICD-10-CM

## 2019-04-26 DIAGNOSIS — U071 COVID-19: Secondary | ICD-10-CM | POA: Diagnosis not present

## 2019-04-26 DIAGNOSIS — F320 Major depressive disorder, single episode, mild: Secondary | ICD-10-CM

## 2019-04-26 LAB — COMPREHENSIVE METABOLIC PANEL
ALT: 43 U/L (ref 0–44)
AST: 51 U/L — ABNORMAL HIGH (ref 15–41)
Albumin: 2.7 g/dL — ABNORMAL LOW (ref 3.5–5.0)
Alkaline Phosphatase: 61 U/L (ref 38–126)
Anion gap: 9 (ref 5–15)
BUN: 30 mg/dL — ABNORMAL HIGH (ref 8–23)
CO2: 25 mmol/L (ref 22–32)
Calcium: 8.1 mg/dL — ABNORMAL LOW (ref 8.9–10.3)
Chloride: 109 mmol/L (ref 98–111)
Creatinine, Ser: 0.99 mg/dL (ref 0.61–1.24)
GFR calc Af Amer: >60 mL/min (ref >60)
GFR calc non Af Amer: >60 mL/min (ref >60)
Glucose, Bld: 170 mg/dL — ABNORMAL HIGH (ref 70–99)
Potassium: 4 mmol/L (ref 3.5–5.1)
Sodium: 143 mmol/L (ref 135–145)
Total Bilirubin: 0.5 mg/dL (ref 0.3–1.2)
Total Protein: 6.5 g/dL (ref 6.5–8.1)

## 2019-04-26 LAB — CBC WITH DIFFERENTIAL/PLATELET
Abs Immature Granulocytes: 0.22 10*3/uL — ABNORMAL HIGH (ref 0.00–0.07)
Basophils Absolute: 0 10*3/uL (ref 0.0–0.1)
Basophils Relative: 0 %
Eosinophils Absolute: 0 10*3/uL (ref 0.0–0.5)
Eosinophils Relative: 0 %
HCT: 30.8 % — ABNORMAL LOW (ref 39.0–52.0)
Hemoglobin: 10.1 g/dL — ABNORMAL LOW (ref 13.0–17.0)
Immature Granulocytes: 2 %
Lymphocytes Relative: 4 %
Lymphs Abs: 0.6 10*3/uL — ABNORMAL LOW (ref 0.7–4.0)
MCH: 30.2 pg (ref 26.0–34.0)
MCHC: 32.8 g/dL (ref 30.0–36.0)
MCV: 92.2 fL (ref 80.0–100.0)
Monocytes Absolute: 0.8 10*3/uL (ref 0.1–1.0)
Monocytes Relative: 5 %
Neutro Abs: 12.8 10*3/uL — ABNORMAL HIGH (ref 1.7–7.7)
Neutrophils Relative %: 89 %
Platelets: 340 10*3/uL (ref 150–400)
RBC: 3.34 MIL/uL — ABNORMAL LOW (ref 4.22–5.81)
RDW: 15.3 % (ref 11.5–15.5)
WBC: 14.5 10*3/uL — ABNORMAL HIGH (ref 4.0–10.5)
nRBC: 0 % (ref 0.0–0.2)

## 2019-04-26 LAB — GLUCOSE, CAPILLARY
Glucose-Capillary: 121 mg/dL — ABNORMAL HIGH (ref 70–99)
Glucose-Capillary: 121 mg/dL — ABNORMAL HIGH (ref 70–99)
Glucose-Capillary: 122 mg/dL — ABNORMAL HIGH (ref 70–99)
Glucose-Capillary: 136 mg/dL — ABNORMAL HIGH (ref 70–99)
Glucose-Capillary: 138 mg/dL — ABNORMAL HIGH (ref 70–99)
Glucose-Capillary: 144 mg/dL — ABNORMAL HIGH (ref 70–99)

## 2019-04-26 LAB — PHOSPHORUS: Phosphorus: 1.8 mg/dL — ABNORMAL LOW (ref 2.5–4.6)

## 2019-04-26 LAB — POCT I-STAT 7, (LYTES, BLD GAS, ICA,H+H)
Bicarbonate: 26.9 mmol/L (ref 20.0–28.0)
Calcium, Ion: 1.21 mmol/L (ref 1.15–1.40)
HCT: 30 % — ABNORMAL LOW (ref 39.0–52.0)
Hemoglobin: 10.2 g/dL — ABNORMAL LOW (ref 13.0–17.0)
O2 Saturation: 99 %
Patient temperature: 99.4
Potassium: 4.1 mmol/L (ref 3.5–5.1)
Sodium: 145 mmol/L (ref 135–145)
TCO2: 28 mmol/L (ref 22–32)
pCO2 arterial: 54.8 mmHg — ABNORMAL HIGH (ref 32.0–48.0)
pH, Arterial: 7.3 — ABNORMAL LOW (ref 7.350–7.450)
pO2, Arterial: 168 mmHg — ABNORMAL HIGH (ref 83.0–108.0)

## 2019-04-26 LAB — PROTIME-INR
INR: 1.2 (ref 0.8–1.2)
Prothrombin Time: 14.6 seconds (ref 11.4–15.2)

## 2019-04-26 LAB — ABO/RH: ABO/RH(D): O POS

## 2019-04-26 LAB — FIBRINOGEN: Fibrinogen: 791 mg/dL — ABNORMAL HIGH (ref 210–475)

## 2019-04-26 LAB — MRSA PCR SCREENING: MRSA by PCR: NEGATIVE

## 2019-04-26 LAB — MAGNESIUM: Magnesium: 2.4 mg/dL (ref 1.7–2.4)

## 2019-04-26 LAB — D-DIMER, QUANTITATIVE: D-Dimer, Quant: 0.89 ug/mL-FEU — ABNORMAL HIGH (ref 0.00–0.50)

## 2019-04-26 LAB — C-REACTIVE PROTEIN: CRP: 10.7 mg/dL — ABNORMAL HIGH (ref <1.0)

## 2019-04-26 MED ORDER — FUROSEMIDE 10 MG/ML IJ SOLN
40.0000 mg | Freq: Four times a day (QID) | INTRAMUSCULAR | Status: AC
  Administered 2019-04-26 (×2): 40 mg via INTRAVENOUS
  Filled 2019-04-26 (×2): qty 4

## 2019-04-26 MED ORDER — IPRATROPIUM-ALBUTEROL 0.5-2.5 (3) MG/3ML IN SOLN
3.0000 mL | Freq: Two times a day (BID) | RESPIRATORY_TRACT | Status: DC
  Administered 2019-04-26 – 2019-04-29 (×6): 3 mL via RESPIRATORY_TRACT
  Filled 2019-04-26 (×6): qty 3

## 2019-04-26 MED ORDER — POLYETHYLENE GLYCOL 3350 17 G PO PACK
17.0000 g | PACK | Freq: Every day | ORAL | Status: DC
  Administered 2019-04-26 – 2019-05-03 (×6): 17 g
  Filled 2019-04-26 (×6): qty 1

## 2019-04-26 NOTE — Progress Notes (Signed)
Pt head repositioned as per protocol for proning.  Pt tolerated well. Continuing with proning pillow to minimize pressure to face.

## 2019-04-26 NOTE — Progress Notes (Signed)
NAME:  Dennis Moore, MRN:  [id number], DOB:  10/13/1955, LOS: 2 ADMISSION DATE:  04/24/2019, CONSULTATION DATE:  10/14 REFERRING MD:  Alcario Drought, CHIEF COMPLAINT:  dyspnea   Brief History   63 year old male admitted with ARDS from COVID-19 pneumonia on April 24, 2019.   Past Medical History  BPH Depression Hyperlipidemia Hypertension  Significant Hospital Events   10/14 intubated ED, admission 10/15 prone  Consults:  PCCM  Procedures:  10/14 ETT >   Significant Diagnostic Tests:    Micro Data:  10/14 blood >  10/14 SARS COV 2> 10/14 resp cx >   Antimicrobials:  10/14 remdesivir >  10/14 decadron >  10/15 convalescent plasma >   Interim history/subjective:  Improved hypoxemia PaO2 168 today Prone overnight  Objective   Blood pressure 128/75, pulse 71, temperature 100 F (37.8 C), temperature source Oral, resp. rate (!) 23, height 6' (1.829 m), weight 98.1 kg, SpO2 96 %.    Vent Mode: PRVC FiO2 (%):  [60 %-80 %] 60 % Set Rate:  [24 bmp] 24 bmp Vt Set:  [470 mL] 470 mL PEEP:  [14 cmH20-16 cmH20] 14 cmH20 Plateau Pressure:  [24 cmH20-26 cmH20] 24 cmH20   Intake/Output Summary (Last 24 hours) at 04/26/2019 1338 Last data filed at 04/26/2019 1200 Gross per 24 hour  Intake 6093.7 ml  Output 1140 ml  Net 4953.7 ml   Filed Weights   04/24/19 1807 04/26/19 0500  Weight: 98 kg 98.1 kg    Examination:  General:  In bed on vent HENT: NCAT ETT in place PULM: CTA B, vent supported breathing CV: RRR, no mgr GI: BS+, soft, nontender MSK: normal bulk and tone Neuro: sedated on vent   October 14 chest x-ray images independently reviewed showing bilateral patchy airspace disease, endotracheal tube with tip at [place]ina.  Resolved Hospital Problem list     Assessment & Plan:  ARDS due to COVID 19 pneumonia: severe Continue mechanical ventilation per ARDS protocol Target TVol 6-8cc/kgIBW Target Plateau Pressure < 30cm H20 Target driving pressure less  than 15 cm of water Target PaO2 55-65: titrate PEEP/FiO2 per protocol As long as PaO2 to FiO2 ratio is less than 1:150 position in prone position for 16 hours a day Check CVP daily if CVL in place Target CVP less than 4, diurese as necessary Ventilator associated pneumonia prevention protocol 10/16 plan: no need for prone positioning, diurese today  Need for sedation for mechanical ventilation RASS goal -3 Start PAD protocol, fentanyl, versed infusions   Best practice:  Diet: start tube feeding Pain/Anxiety/Delirium protocol (if indicated): yes, start today, RASS target -3 VAP protocol (if indicated): yes DVT prophylaxis: enrolled in [place] [facility], routine dose of lovenox GI prophylaxis: Pantoprazole for stress ulcer prophylaxis Glucose control: SSI Mobility: bed rest Code Status: full Family Communication: per [facility] Disposition: remain in ICU  Labs   CBC: Recent Labs  Lab 04/24/19 1803  04/24/19 2318 04/25/19 0510 04/25/19 0615 04/26/19 0555 04/26/19 0811  WBC 11.3*  --   --   --  12.0* 14.5*  --   NEUTROABS  --   --   --   --  11.1* 12.8*  --   HGB 11.8*   < > 11.2* 11.2* 10.9* 10.1* 10.2*  HCT 32.4*   < > 33.0* 33.0* 33.0* 30.8* 30.0*  MCV 86.6  --   --   --  89.2 92.2  --   PLT 300  --   --   --  331 340  --    < > =  values in this interval not displayed.    Basic Metabolic Panel: Recent Labs  Lab 04/24/19 1803  04/24/19 2318 04/25/19 0510 04/25/19 0615 04/25/19 1658 04/26/19 0555 04/26/19 0811  NA 134*   < > 135 135 136  --  143 145  K 3.4*   < > 3.6 3.9 3.9  --  4.0 4.1  CL 99  --   --   --  101  --  109  --   CO2 20*  --   --   --  22  --  25  --   GLUCOSE 118*  --   --   --  197*  --  170*  --   BUN 18  --   --   --  26*  --  30*  --   CREATININE 1.36*  --   --   --  1.10  --  0.99  --   CALCIUM 8.3*  --   --   --  8.1*  --  8.1*  --   MG  --   --   --   --  2.1 2.2 2.4  --   PHOS  --   --   --   --  3.7 2.8 1.8*  --    < > = values in this interval  not displayed.   GFR: Estimated Creatinine Clearance: 92.7 mL/min (by C-G formula based on SCr of 0.99 mg/dL). Recent Labs  Lab 04/24/19 1803 04/24/19 2131 04/25/19 0615 04/26/19 0555  PROCALCITON  --  0.31  --   --   WBC 11.3*  --  12.0* 14.5*    Liver Function Tests: Recent Labs  Lab 04/24/19 2212 04/25/19 0615 04/26/19 0555  AST 81* 63* 51*  ALT 58* 52* 43  ALKPHOS 69 67 61  BILITOT 0.5 0.5 0.5  PROT 7.6 7.2 6.5  ALBUMIN 3.0* 2.9* 2.7*   No results for input(s): LIPASE, AMYLASE in the last 168 hours. No results for input(s): AMMONIA in the last 168 hours.  ABG    Component Value Date/Time   PHART 7.300 (L) 04/26/2019 0811   PCO2ART 54.8 (H) 04/26/2019 0811   PO2ART 168.0 (H) 04/26/2019 0811   HCO3 26.9 04/26/2019 0811   TCO2 28 04/26/2019 0811   ACIDBASEDEF 3.0 (H) 04/25/2019 0510   O2SAT 99.0 04/26/2019 0811     Coagulation Profile: Recent Labs  Lab 04/25/19 1835 04/26/19 0555  INR 1.1 1.2    Cardiac Enzymes: No results for input(s): CKTOTAL, CKMB, CKMBINDEX, TROPONINI in the last 168 hours.  HbA1C: Hgb A1c MFr Bld  Date/Time Value Ref Range Status  12/11/2012 11:10 AM 5.5 4.6 - 6.5 % Final    Comment:    Glycemic Control Guidelines for People with Diabetes:Non Diabetic:  <6%Goal of Therapy: <7%Additional Action Suggested:  >8%   01/16/2008 08:47 AM 5.7 4.6 - 6.0 % Final    Comment:    See lab report for associated comment(s)    CBG: Recent Labs  Lab 04/25/19 2030 04/25/19 2319 04/26/19 0333 04/26/19 0817 04/26/19 1235  GLUCAP 151* 162* 144* 121* 138*     Critical care time: 35 minutes      Roselie Awkward, MD [place] [facility] Pager: [phone] Cell: [phone] If no response, call [phone]

## 2019-04-26 NOTE — Progress Notes (Signed)
Assisted tele visit to patient with wife and daughter  .  Krithi Bray M Breean Nannini, RN   

## 2019-04-26 NOTE — Progress Notes (Signed)
ANTICOAGULATION CONSULT NOTE - Initial Consult  Pharmacy Consult for Lovenox Indication: VTE prophylaxis (COVID-PACT trial - prophylaxis dose)   Allergies  Allergen Reactions  . Penicillins Swelling and Rash    Patient Measurements: Height: 6' (182.9 cm) Weight: 216 lb 4.3 oz (98.1 kg) IBW/kg (Calculated) : 77.6  Vital Signs: Temp: 98.4 F (36.9 C) (10/16 0800) Temp Source: Axillary (10/16 0800) BP: 107/58 (10/16 0813) Pulse Rate: 60 (10/16 0813)  Labs: Recent Labs    04/24/19 1803  04/24/19 2155  04/25/19 0615 04/25/19 1835 04/26/19 0555 04/26/19 0811  HGB 11.8*   < >  --    < > 10.9*  --  10.1* 10.2*  HCT 32.4*   < >  --    < > 33.0*  --  30.8* 30.0*  PLT 300  --   --   --  331  --  340  --   LABPROT  --   --   --   --   --  14.4  --   --   INR  --   --   --   --   --  1.1  --   --   CREATININE 1.36*  --   --   --  1.10  --  0.99  --   TROPONINIHS 22*  --  26*  --   --   --   --   --    < > = values in this interval not displayed.    Estimated Creatinine Clearance: 92.7 mL/min (by C-G formula based on SCr of 0.99 mg/dL).   Medical History: Past Medical History:  Diagnosis Date  . Allergic rhinitis   . BPH (benign prostatic hypertrophy)   . Depression   . Erectile dysfunction   . Hyperlipidemia   . Hypertension   . Internal hemorrhoids   . Tubular adenoma of colon 11/2008    Medications:  Medications Prior to Admission  Medication Sig Dispense Refill Last Dose  . albuterol (PROVENTIL HFA;VENTOLIN HFA) 108 (90 Base) MCG/ACT inhaler Inhale 2 puffs into the lungs every 6 (six) hours as needed for wheezing. 54 Inhaler 3 04/24/2019 at Unknown time  . amLODipine (NORVASC) 5 MG tablet Take 5 mg by mouth daily.   04/23/2019  . aspirin EC 81 MG tablet Take 81 mg by mouth daily.   04/23/2019  . atorvastatin (LIPITOR) 80 MG tablet Take 80 mg by mouth daily.   04/23/2019  . benzonatate (TESSALON) 200 MG capsule Take 200 mg by mouth 3 (three) times daily as needed  for cough.   04/23/2019  . cholecalciferol (VITAMIN D3) 25 MCG (1000 UT) tablet Take 1,000 Units by mouth daily.   04/23/2019  . FLUoxetine (PROZAC) 20 MG capsule Take 20 mg by mouth daily.   04/23/2019  . lisinopril-hydrochlorothiazide (PRINZIDE,ZESTORETIC) 20-12.5 MG tablet Take 2 tablets by mouth daily. 60 tablet 11 04/23/2019  . omeprazole (PRILOSEC) 20 MG capsule Take 20 mg by mouth daily as needed for indigestion.   Past Week at Unknown time  . sildenafil (VIAGRA) 100 MG tablet Take 0.5 tablets (50 mg total) by mouth daily as needed for erectile dysfunction. 10 tablet 0 unk  . vitamin C (ASCORBIC ACID) 500 MG tablet Take 500 mg by mouth daily.   04/23/2019  . zinc gluconate 50 MG tablet Take 50 mg by mouth daily.   04/23/2019  . celecoxib (CELEBREX) 200 MG capsule Take 1 capsule (200 mg total) by mouth daily as needed. (Patient not  taking: Reported on 04/25/2019) 30 capsule 11 Not Taking at Unknown time  . diazepam (VALIUM) 5 MG tablet Take 1 tablet (5 mg total) by mouth every 8 (eight) hours as needed for muscle spasms. (Patient not taking: Reported on 04/25/2019) 10 tablet 0 Not Taking at Unknown time  . fenofibrate 160 MG tablet Take 1 tablet (160 mg total) by mouth daily. (Patient not taking: Reported on 04/25/2019) 30 tablet 5 Not Taking at Unknown time  . rosuvastatin (CRESTOR) 20 MG tablet Take 1 tablet (20 mg total) by mouth daily. 30 tablet 5     Assessment: 53 YOM admitted to the ICU with respiratory failure secondary to COVID-19 pneumonia. Pharmacy consulted to transition Lovenox to COVID-PACT regimen for VTE prophylaxis. Patient randomized to the prophylaxis dose arm. H/H low stable. Plt wnl. CrCl > 30 ml/min   Goal of Therapy:  VTE prophylaxis Monitor platelets by anticoagulation protocol: Yes   Plan:  -Lovenox 40 mg daily  -Monitor CBC and renal function   Albertina Parr, PharmD., BCPS Clinical Pharmacist Clinical phone for 04/26/19 until 5pm: [phone]

## 2019-04-26 NOTE — Progress Notes (Addendum)
PROGRESS NOTE    Dennis Moore  [id number] DOB: [birth year] DOA: 04/24/2019 PCP: Ann Held, DO   Brief Narrative:  63 y.o. BM PMHx depression, HTN.,  HLD, BPH   From PCP records I can see that the patient called 10/7 with fevers and cough onset 10/6.  He was tested for COVID on 10/8 and the test came back positive (RT PCR, result visible to [place] under Care Everywhere > Lab results [place] > Viral tests).  Patient presented to the ED with worsening SOB, respiratory distress.  Was satting 66s on RA  Per EDP note: "Has had progressively worsening cough, now with shortness of breath for the past 2 or 3 days. Denies chest pain. Intermittent fevers. No abdominal pain. No vomiting, no diarrhea. Symptoms constant, moderate in severity, no exacerbating or alleviating factors."  ED Course: Sats were still 89% on NRB, tachypnic with RR 30-40s.  CXR showing multifocal bilateral PNA c/w COVID  EDP made decision to intubate patient and patient was intubated.  Hospitalist has been called to admit patient over to [facility] and PCCM has been consulted.  Subjective: 10/16 intubated/sedated     Assessment & Plan:   Principal Problem:   Acute respiratory distress syndrome (ARDS) due to COVID-19 virus [facility]) Active Problems:   Essential hypertension   Acute respiratory failure with hypoxia (HCC)   Depression   Acute on chronic respiratory failure with hypoxia (HCC)  Acute respiratory failure with hypoxia/Covid 19 pneumonia -Remdesivir per pharmacy protocol -Decadron 6 mg daily -Spoke with Mrs. Vota and counseled her on the use of Covid convalescent plasma.  Informed not approving therapy for treatment of Covid 19 although there is some data showing benefit from its use.  She has agreed to sign consent form and is on her way to the hospital as we speak. -10/15 transfuse 1 unit Covid convalescent plasma; once paperwork signed -Patient with leukocytosis,  possible bacterial infection hold on administration of Actemra -Prone patient for 16 hours/day, 8 hours off -Combivent QID -Daily Covid labs pending -Adjust vent settings to remain within ARDS net guidelines -10/16 wean patient sedation medication as tolerated COVID-19 Labs  Recent Labs    04/24/19 2131 04/25/19 0615 04/26/19 0555  DDIMER 1.23* 0.98* 0.89*  FERRITIN 1,976*  --   --   LDH 380*  --   --   CRP 25.5* 23.0* 10.7*    Lab Results  Component Value Date   SARSCOV2NAA POSITIVE (A) 04/24/2019   [place] Not Detected 02/21/2019   ARDS -See Covid pneumonia  Essential HTN -Patient borderline hypotensive hold BP medication -Would start Levophed PRN to maintain MAP>65  Depression -Patient currently sedated and intubated not an issue     DVT prophylaxis: Lovenox--> Covid Pact trial Code Status: Full Family Communication: 10/16 spoke at length with Ms. Manley Mason concerning plan of care answered all questions  Disposition Plan: TBD   Consultants:  PCCM     Procedures/Significant Events:  10/15 PCXR; worsening multifocal pneumonia     I have personally reviewed and interpreted all radiology studies and my findings are as above.  VENTILATOR SETTINGS: Vent mode; PRVC Set rate; 24 Vt Set; 470 FiO2; 60% PEEP; 12   Cultures 10/14 blood LEFT forearm positive coag negative staph (most likely contaminant) 10/14 SARS coronavirus positive     Antimicrobials: Anti-infectives (From admission, onward)   Start     Stop   04/26/19 1000  remdesivir 100 mg in sodium chloride 0.9 % 250 mL IVPB  04/30/19 0959   04/25/19 0630  remdesivir 200 mg in sodium chloride 0.9 % 250 mL IVPB     04/25/19 0728   04/25/19 0000  levofloxacin (LEVAQUIN) IVPB 750 mg  Status:  Discontinued     04/25/19 1153       Devices    LINES / TUBES:      Continuous Infusions:  sodium chloride     sodium chloride 10 mL/hr at 04/26/19 1553   feeding supplement (VITAL  1.5 CAL) 40 mL/hr at 04/25/19 1900   fentaNYL infusion INTRAVENOUS 200 mcg/hr (04/26/19 1700)   midazolam 5 mg/hr (04/26/19 1655)   remdesivir 100 mg in NS 250 mL 100 mg (04/26/19 1034)   vancomycin 1,250 mg (04/26/19 1251)     Objective: Vitals:   04/26/19 1500 04/26/19 1535 04/26/19 1600 04/26/19 1700  BP: 137/79 134/78 128/64 123/60  Pulse: 65 74 70 70  Resp: (!) 24 (!) 24 (!) 24 (!) 24  Temp:      TempSrc:      SpO2: 98% 96% 93% 93%  Weight:      Height:        Intake/Output Summary (Last 24 hours) at 04/26/2019 1724 Last data filed at 04/26/2019 1700 Gross per 24 hour  Intake 7871.94 ml  Output 1140 ml  Net 6731.94 ml   Filed Weights   04/24/19 1807 04/26/19 0500  Weight: 98 kg 98.1 kg    Physical Exam:  General: Sedated/intubated, comfortable, positive acute respiratory distress Eyes: negative scleral hemorrhage, negative anisocoria, negative icterus ENT: Negative Runny nose, negative gingival bleeding, #7.5 ETT tube in place Neck:  Negative scars, masses, torticollis, lymphadenopathy, JVD Lungs: Clear to auscultation bilaterally without wheezes or crackles Cardiovascular: Regular rate and rhythm without murmur gallop or rub normal S1 and S2 Abdomen: negative abdominal pain, nondistended, positive soft, bowel sounds, no rebound, no ascites, no appreciable mass Extremities: No significant cyanosis, clubbing, or edema bilateral lower extremities Skin: Negative rashes, lesions, ulcers Psychiatric: Sedated/intubated Central nervous system: Sedated/intubated      Data Reviewed: Care during the described time interval was provided by me .  I have reviewed this patient's available data, including medical history, events of note, physical examination, and all test results as part of my evaluation.   CBC: Recent Labs  Lab 04/24/19 1803  04/24/19 2318 04/25/19 0510 04/25/19 0615 04/26/19 0555 04/26/19 0811  WBC 11.3*  --   --   --  12.0* 14.5*  --     NEUTROABS  --   --   --   --  11.1* 12.8*  --   HGB 11.8*   < > 11.2* 11.2* 10.9* 10.1* 10.2*  HCT 32.4*   < > 33.0* 33.0* 33.0* 30.8* 30.0*  MCV 86.6  --   --   --  89.2 92.2  --   PLT 300  --   --   --  331 340  --    < > = values in this interval not displayed.   Basic Metabolic Panel: Recent Labs  Lab 04/24/19 1803  04/24/19 2318 04/25/19 0510 04/25/19 0615 04/25/19 1658 04/26/19 0555 04/26/19 0811  NA 134*   < > 135 135 136  --  143 145  K 3.4*   < > 3.6 3.9 3.9  --  4.0 4.1  CL 99  --   --   --  101  --  109  --   CO2 20*  --   --   --  22  --  25  --   GLUCOSE 118*  --   --   --  197*  --  170*  --   BUN 18  --   --   --  26*  --  30*  --   CREATININE 1.36*  --   --   --  1.10  --  0.99  --   CALCIUM 8.3*  --   --   --  8.1*  --  8.1*  --   MG  --   --   --   --  2.1 2.2 2.4  --   PHOS  --   --   --   --  3.7 2.8 1.8*  --    < > = values in this interval not displayed.   GFR: Estimated Creatinine Clearance: 92.7 mL/min (by C-G formula based on SCr of 0.99 mg/dL). Liver Function Tests: Recent Labs  Lab 04/24/19 2212 04/25/19 0615 04/26/19 0555  AST 81* 63* 51*  ALT 58* 52* 43  ALKPHOS 69 67 61  BILITOT 0.5 0.5 0.5  PROT 7.6 7.2 6.5  ALBUMIN 3.0* 2.9* 2.7*   No results for input(s): LIPASE, AMYLASE in the last 168 hours. No results for input(s): AMMONIA in the last 168 hours. Coagulation Profile: Recent Labs  Lab 04/25/19 1835 04/26/19 0555  INR 1.1 1.2   Cardiac Enzymes: No results for input(s): CKTOTAL, CKMB, CKMBINDEX, TROPONINI in the last 168 hours. BNP (last 3 results) No results for input(s): PROBNP in the last 8760 hours. HbA1C: No results for input(s): HGBA1C in the last 72 hours. CBG: Recent Labs  Lab 04/25/19 2319 04/26/19 0333 04/26/19 0817 04/26/19 1235 04/26/19 1648  GLUCAP 162* 144* 121* 138* 136*   Lipid Profile: Recent Labs    04/24/19 2131  TRIG 194*   Thyroid Function Tests: No results for input(s): TSH, T4TOTAL,  FREET4, T3FREE, THYROIDAB in the last 72 hours. Anemia Panel: Recent Labs    04/24/19 2131  FERRITIN 1,976*   Urine analysis:    Component Value Date/Time   COLORURINE YELLOW 01/05/2010 2000   APPEARANCEUR CLEAR 01/05/2010 2000   LABSPEC 1.027 01/05/2010 2000   PHURINE 8.5 (H) 01/05/2010 2000   GLUCOSEU NEGATIVE 01/05/2010 2000   HGBUR NEGATIVE 01/05/2010 2000   HGBUR negative 12/04/2008 0802   BILIRUBINUR neg 08/25/2015 1206   KETONESUR NEGATIVE 01/05/2010 2000   PROTEINUR neg 08/25/2015 1206   PROTEINUR 30 (A) 01/05/2010 2000   UROBILINOGEN 0.2 08/25/2015 1206   UROBILINOGEN 1.0 01/05/2010 2000   NITRITE neg 08/25/2015 1206   NITRITE NEGATIVE 01/05/2010 2000   LEUKOCYTESUR Negative 08/25/2015 1206   Sepsis Labs: @LABRCNTIP (procalcitonin:4,lacticidven:4)  ) Recent Results (from the past 240 hour(s))  SARS CORONAVIRUS 2 (TAT 6-24 HRS)     Status: Abnormal   Collection Time: 04/24/19  7:06 PM  Result Value Ref Range Status   SARS Coronavirus 2 POSITIVE (A) NEGATIVE Final    Comment: RESULT CALLED TO, READ BACK BY AND VERIFIED WITH: RN H BILLITER @ [phone] 04/25/19 BY S GEZAHEGN (NOTE) SARS-CoV-2 target nucleic acids are DETECTED. The SARS-CoV-2 RNA is generally detectable in upper and lower respiratory specimens during the acute phase of infection. Positive results are indicative of active infection with SARS-CoV-2. Clinical  correlation with patient history and other diagnostic information is necessary to determine patient infection status. Positive results do  not rule out bacterial infection or co-infection with other viruses. The expected result is Negative. Fact Sheet for Patients:  SugarRoll.be Fact Sheet for Healthcare Providers: https://www.-mathews.com/ This test is not yet approved or cleared by the [place] FDA and  has been authorized for detection and/or diagnosis of SARS-CoV-2 by FDA under an Emergency Use  Authorization (EUA). This EUA will remain  in effect (meaning this test can be use d) for the duration of the COVID-19 declaration under Section 564(b)(1) of the Act, 21 U.S.C. section 360bbb-3(b)(1), unless the authorization is terminated or revoked sooner. Performed at [facility], [place] [address], [place], [place] [address]   Culture, blood (single) w Reflex to ID Panel     Status: Abnormal (Preliminary result)   Collection Time: 04/24/19 10:00 PM   Specimen: BLOOD LEFT FOREARM  Result Value Ref Range Status   Specimen Description BLOOD LEFT FOREARM  Final   Special Requests   Final    BOTTLES DRAWN AEROBIC AND ANAEROBIC Blood Culture adequate volume   Culture  Setup Time   Final    IN BOTH AEROBIC AND ANAEROBIC BOTTLES GRAM POSITIVE COCCI CRITICAL RESULT CALLED TO, READ BACK BY AND VERIFIED WITH: N GLOGOVAC PHARMD 04/25/19 2230 JDW    Culture (A)  Final    STAPHYLOCOCCUS SPECIES (COAGULASE NEGATIVE) THE SIGNIFICANCE OF ISOLATING THIS ORGANISM FROM A SINGLE SET OF BLOOD CULTURES WHEN MULTIPLE SETS ARE DRAWN IS UNCERTAIN. PLEASE NOTIFY THE MICROBIOLOGY DEPARTMENT WITHIN ONE WEEK IF SPECIATION AND SENSITIVITIES ARE REQUIRED. Performed at [facility], [place] [address] [place], [place] [address]    Report Status PENDING  Incomplete         Radiology Studies: Dg Chest Port 1 View  Result Date: 04/25/2019 CLINICAL DATA:  COVID-19 positive patient who is intubated. Repositioning of endotracheal tube. EXAM: PORTABLE CHEST 1 VIEW COMPARISON:  Single-view of the chest 04/23/2014. FINDINGS: Endotracheal tube has been pulled back with the tip now in good position at the level of the clavicular heads. NG tube courses into the stomach and below the inferior margin of the film. Extensive bilateral airspace disease has worsened. Heart size is upper normal. No pneumothorax. IMPRESSION: ETT is in good position. Worsened multifocal pneumonia. Electronically Signed   By: Inge Rise M.D.   On: 04/25/2019 11:29   Dg Chest Port 1 View  Addendum Date: 04/24/2019   ADDENDUM REPORT: 04/24/2019 22:52 ADDENDUM: ETT position discussed by telephone with Dr. Gerlene Fee on 04/24/2019 at 22:50 hours. Electronically Signed   By: Genevie Ann M.D.   On: 04/24/2019 22:52   Result Date: 04/24/2019 CLINICAL DATA:  63 year old male intubated. Positive for COVID-19. EXAM: PORTABLE CHEST 1 VIEW COMPARISON:  1830 hours today and earlier. FINDINGS: Portable AP supine view at 2236 hours. Endotracheal tube tip in place and just inside the right mainstem bronchus. Retraction of 2-3 centimeters should allow for optimal placement. Increased retrocardiac opacity and air bronchograms probably in part atelectasis. Patchy and indistinct multifocal bilateral pulmonary opacity elsewhere persist. No pleural effusion or pneumothorax. Stable cardiac size and mediastinal contours. IMPRESSION: 1. Right mainstem bronchus intubation. Retraction of 2-3 cm should allow for optimal ETT placement. 2. Associated left lower lobe atelectasis. Otherwise stable bilateral COVID-19 pneumonia Electronically Signed: By: Genevie Ann M.D. On: 04/24/2019 22:48   Dg Chest Portable 1 View  Result Date: 04/24/2019 CLINICAL DATA:  Shortness of breath, COVID-19 positive EXAM: PORTABLE CHEST 1 VIEW COMPARISON:  02/10/2014 FINDINGS: Multifocal bilateral interstitial and ground-glass opacity. No pleural effusion. Mild cardiomegaly. No pneumothorax. IMPRESSION: Multifocal bilateral interstitial and ground-glass opacity, felt consistent with pneumonia and  provided clinical history. Electronically Signed   By: Donavan Foil M.D.   On: 04/24/2019 19:02        Scheduled Meds:  sodium chloride   Intravenous Once   chlorhexidine  15 mL Mouth/Throat BID   Chlorhexidine Gluconate Cloth  6 each Topical Daily   dexamethasone (DECADRON) injection  6 mg Intravenous Q24H   enoxaparin (LOVENOX) injection  40 mg Subcutaneous Q24H    etomidate  30 mg Intravenous Once   feeding supplement (PRO-STAT SUGAR FREE 64)  60 mL Per Tube TID   fentaNYL (SUBLIMAZE) injection  50 mcg Intravenous Once   furosemide  40 mg Intravenous Q6H   insulin aspart  0-9 Units Subcutaneous Q4H   ipratropium-albuterol  3 mL Nebulization BID   mouth rinse  15 mL Mouth Rinse 10 times per day   pantoprazole sodium  40 mg Per Tube Daily   polyethylene glycol  17 g Per Tube Daily   succinylcholine  150 mg Intravenous Once   Continuous Infusions:  sodium chloride     sodium chloride 10 mL/hr at 04/26/19 1553   feeding supplement (VITAL 1.5 CAL) 40 mL/hr at 04/25/19 1900   fentaNYL infusion INTRAVENOUS 200 mcg/hr (04/26/19 1700)   midazolam 5 mg/hr (04/26/19 1655)   remdesivir 100 mg in NS 250 mL 100 mg (04/26/19 1034)   vancomycin 1,250 mg (04/26/19 1251)     LOS: 2 days   The patient is critically ill with multiple organ systems failure and requires high complexity decision making for assessment and support, frequent evaluation and titration of therapies, application of advanced monitoring technologies and extensive interpretation of multiple databases. Critical Care Time devoted to patient care services described in this note  Time spent: 40 minutes     Jodee Wagenaar, Geraldo Docker, MD [facility] Pager [phone]  If 7PM-7AM, please contact night-coverage [email or web address] Password [facility] 04/26/2019, 5:24 PM

## 2019-04-26 NOTE — Progress Notes (Signed)
Pt head position changed for proning. Pt tolerating well. Continuing to follow progress.

## 2019-04-27 ENCOUNTER — Inpatient Hospital Stay (HOSPITAL_COMMUNITY): Payer: 59

## 2019-04-27 ENCOUNTER — Encounter (HOSPITAL_COMMUNITY): Payer: Self-pay

## 2019-04-27 LAB — POCT I-STAT 7, (LYTES, BLD GAS, ICA,H+H)
Acid-Base Excess: 4 mmol/L — ABNORMAL HIGH (ref 0.0–2.0)
Acid-Base Excess: 5 mmol/L — ABNORMAL HIGH (ref 0.0–2.0)
Bicarbonate: 30.2 mmol/L — ABNORMAL HIGH (ref 20.0–28.0)
Bicarbonate: 30.3 mmol/L — ABNORMAL HIGH (ref 20.0–28.0)
Calcium, Ion: 1.26 mmol/L (ref 1.15–1.40)
Calcium, Ion: 1.25 mmol/L (ref 1.15–1.40)
HCT: 31 % — ABNORMAL LOW (ref 39.0–52.0)
HCT: 35 % — ABNORMAL LOW (ref 39.0–52.0)
Hemoglobin: 10.5 g/dL — ABNORMAL LOW (ref 13.0–17.0)
Hemoglobin: 11.9 g/dL — ABNORMAL LOW (ref 13.0–17.0)
O2 Saturation: 95 %
O2 Saturation: 98 %
Patient temperature: 98.6
Patient temperature: 37.6
Potassium: 3.6 mmol/L (ref 3.5–5.1)
Potassium: 4.1 mmol/L (ref 3.5–5.1)
Sodium: 147 mmol/L — ABNORMAL HIGH (ref 135–145)
Sodium: 149 mmol/L — ABNORMAL HIGH (ref 135–145)
TCO2: 32 mmol/L (ref 22–32)
TCO2: 32 mmol/L (ref 22–32)
pCO2 arterial: 49.2 mmHg — ABNORMAL HIGH (ref 32.0–48.0)
pCO2 arterial: 49.2 mmHg — ABNORMAL HIGH (ref 32.0–48.0)
pH, Arterial: 7.396 (ref 7.350–7.450)
pH, Arterial: 7.4 (ref 7.350–7.450)
pO2, Arterial: 78 mmHg — ABNORMAL LOW (ref 83.0–108.0)
pO2, Arterial: 111 mmHg — ABNORMAL HIGH (ref 83.0–108.0)

## 2019-04-27 LAB — CBC WITH DIFFERENTIAL/PLATELET
Abs Immature Granulocytes: 0.76 10*3/uL — ABNORMAL HIGH (ref 0.00–0.07)
Basophils Absolute: 0.1 10*3/uL (ref 0.0–0.1)
Basophils Relative: 0 %
Eosinophils Absolute: 0 10*3/uL (ref 0.0–0.5)
Eosinophils Relative: 0 %
HCT: 32.1 % — ABNORMAL LOW (ref 39.0–52.0)
Hemoglobin: 10.2 g/dL — ABNORMAL LOW (ref 13.0–17.0)
Immature Granulocytes: 5 %
Lymphocytes Relative: 7 %
Lymphs Abs: 1 10*3/uL (ref 0.7–4.0)
MCH: 29.6 pg (ref 26.0–34.0)
MCHC: 31.8 g/dL (ref 30.0–36.0)
MCV: 93 fL (ref 80.0–100.0)
Monocytes Absolute: 1 10*3/uL (ref 0.1–1.0)
Monocytes Relative: 7 %
Neutro Abs: 11.6 10*3/uL — ABNORMAL HIGH (ref 1.7–7.7)
Neutrophils Relative %: 81 %
Platelets: 341 10*3/uL (ref 150–400)
RBC: 3.45 MIL/uL — ABNORMAL LOW (ref 4.22–5.81)
RDW: 15.7 % — ABNORMAL HIGH (ref 11.5–15.5)
WBC: 14.3 10*3/uL — ABNORMAL HIGH (ref 4.0–10.5)
nRBC: 0 % (ref 0.0–0.2)

## 2019-04-27 LAB — CULTURE, BLOOD (SINGLE): Special Requests: ADEQUATE

## 2019-04-27 LAB — BPAM FFP
Blood Product Expiration Date: 202010170001
ISSUE DATE / TIME: 202010160050
Unit Type and Rh: 5100

## 2019-04-27 LAB — COMPREHENSIVE METABOLIC PANEL
ALT: 46 U/L — ABNORMAL HIGH (ref 0–44)
AST: 54 U/L — ABNORMAL HIGH (ref 15–41)
Albumin: 2.7 g/dL — ABNORMAL LOW (ref 3.5–5.0)
Alkaline Phosphatase: 59 U/L (ref 38–126)
Anion gap: 11 (ref 5–15)
BUN: 30 mg/dL — ABNORMAL HIGH (ref 8–23)
CO2: 29 mmol/L (ref 22–32)
Calcium: 8.8 mg/dL — ABNORMAL LOW (ref 8.9–10.3)
Chloride: 110 mmol/L (ref 98–111)
Creatinine, Ser: 0.81 mg/dL (ref 0.61–1.24)
GFR calc Af Amer: >60 mL/min (ref >60)
GFR calc non Af Amer: >60 mL/min (ref >60)
Glucose, Bld: 148 mg/dL — ABNORMAL HIGH (ref 70–99)
Potassium: 3.8 mmol/L (ref 3.5–5.1)
Sodium: 150 mmol/L — ABNORMAL HIGH (ref 135–145)
Total Bilirubin: 0.5 mg/dL (ref 0.3–1.2)
Total Protein: 6.7 g/dL (ref 6.5–8.1)

## 2019-04-27 LAB — C-REACTIVE PROTEIN: CRP: 8.1 mg/dL — ABNORMAL HIGH (ref <1.0)

## 2019-04-27 LAB — PREPARE FRESH FROZEN PLASMA

## 2019-04-27 LAB — GLUCOSE, CAPILLARY
Glucose-Capillary: 118 mg/dL — ABNORMAL HIGH (ref 70–99)
Glucose-Capillary: 126 mg/dL — ABNORMAL HIGH (ref 70–99)
Glucose-Capillary: 127 mg/dL — ABNORMAL HIGH (ref 70–99)
Glucose-Capillary: 138 mg/dL — ABNORMAL HIGH (ref 70–99)
Glucose-Capillary: 140 mg/dL — ABNORMAL HIGH (ref 70–99)

## 2019-04-27 LAB — VANCOMYCIN, PEAK
Vancomycin Pk: 9 ug/mL — ABNORMAL LOW (ref 30–40)
Vancomycin Pk: 27 ug/mL — ABNORMAL LOW (ref 30–40)

## 2019-04-27 LAB — PHOSPHORUS: Phosphorus: 2.1 mg/dL — ABNORMAL LOW (ref 2.5–4.6)

## 2019-04-27 LAB — D-DIMER, QUANTITATIVE: D-Dimer, Quant: 1.54 ug/mL-FEU — ABNORMAL HIGH (ref 0.00–0.50)

## 2019-04-27 LAB — PROTIME-INR
INR: 1.1 (ref 0.8–1.2)
Prothrombin Time: 14.2 seconds (ref 11.4–15.2)

## 2019-04-27 LAB — PROCALCITONIN: Procalcitonin: <0.1 ng/mL

## 2019-04-27 LAB — LACTIC ACID, PLASMA: Lactic Acid, Venous: 1.6 mmol/L (ref 0.5–1.9)

## 2019-04-27 LAB — MAGNESIUM: Magnesium: 2 mg/dL (ref 1.7–2.4)

## 2019-04-27 NOTE — Progress Notes (Signed)
Family called (spouse and daughter) for update. All questions answered and family voiced gratitude for care being provided.

## 2019-04-27 NOTE — Progress Notes (Addendum)
NAME:  Dennis Moore, MRN:  [id number], DOB:  1956/03/08, LOS: 3 ADMISSION DATE:  04/24/2019, CONSULTATION DATE:  10/14 REFERRING MD:  Alcario Drought, CHIEF COMPLAINT:  dyspnea   Brief History   63 year old male admitted with ARDS from COVID-19 pneumonia on April 24, 2019.   Past Medical History  BPH Depression Hyperlipidemia Hypertension  Significant Hospital Events   10/14 intubated ED, admission 10/15 prone  Consults:  PCCM  Procedures:  10/14 ETT >   Significant Diagnostic Tests:    Micro Data:  10/14 blood >  10/14 SARS COV 2> 10/14 resp cx >   Antimicrobials:  10/14 remdesivir >  10/14 decadron >  10/15 convalescent plasma >   Interim history/subjective:  Improved hypoxemia PaO2/FIO2 156   Objective   Blood pressure 122/66, pulse 61, temperature 99 F (37.2 C), temperature source Oral, resp. rate (!) 24, height 6' (1.829 m), weight 93 kg, SpO2 93 %.    Vent Mode: PRVC FiO2 (%):  [40 %-50 %] 50 % Set Rate:  [24 bmp] 24 bmp Vt Set:  [470 mL] 470 mL PEEP:  [12 cmH20] 12 cmH20 Plateau Pressure:  [21 cmH20-24 cmH20] 22 cmH20   Intake/Output Summary (Last 24 hours) at 04/27/2019 1947 Last data filed at 04/27/2019 1900 Gross per 24 hour  Intake 3484.59 ml  Output 3200 ml  Net 284.59 ml   Filed Weights   04/24/19 1807 04/26/19 0500 04/27/19 0500  Weight: 98 kg 98.1 kg 93 kg    Examination:  General:  In bed on vent HENT: NCAT ETT in place PULM: CTA B, vent supported breathing CV: RRR, no mgr GI: BS+, soft, nontender MSK: normal bulk and tone Neuro: sedated on vent   October 14 chest x-ray images independently reviewed showing bilateral patchy airspace disease,  Resolved Hospital Problem list     Assessment & Plan:  ARDS due to COVID 19 pneumonia: severe Continue mechanical ventilation per ARDS protocol Target TVol 6-8cc/kgIBW Target Plateau Pressure < 30cm H20 Target driving pressure less than 15 cm of water Target PaO2 55-65:  titrate PEEP/FiO2 per protocol As long as PaO2 to FiO2 ratio is less than 1:150 position in prone position for 16 hours a day Check CVP daily if CVL in place Target CVP less than 4, diurese as necessary Ventilator associated pneumonia prevention protocol 10/16 plan: no need for prone positioning, diurese today  Persistent fever: check sputum cx, procalcitonin.  Hold off on antibiotics for now  Need for sedation for mechanical ventilation RASS goal -3 Start PAD protocol, fentanyl, versed infusions   Best practice:  Diet: start tube feeding Pain/Anxiety/Delirium protocol (if indicated): yes, start today, RASS target -3 VAP protocol (if indicated): yes DVT prophylaxis: enrolled in [place] PACT, routine dose of lovenox GI prophylaxis: Pantoprazole for stress ulcer prophylaxis Glucose control: SSI Mobility: bed rest Code Status: full Family Communication: per [facility] Disposition: remain in ICU  Labs   CBC: Recent Labs  Lab 04/24/19 1803  04/25/19 0615 04/26/19 0555 04/26/19 0811 04/27/19 0342 04/27/19 0553 04/27/19 1233  WBC 11.3*  --  12.0* 14.5*  --   --  14.3*  --   NEUTROABS  --   --  11.1* 12.8*  --   --  11.6*  --   HGB 11.8*   < > 10.9* 10.1* 10.2* 10.5* 10.2* 11.9*  HCT 32.4*   < > 33.0* 30.8* 30.0* 31.0* 32.1* 35.0*  MCV 86.6  --  89.2 92.2  --   --  93.0  --  PLT 300  --  331 340  --   --  341  --    < > = values in this interval not displayed.    Basic Metabolic Panel: Recent Labs  Lab 04/24/19 1803  04/25/19 0615 04/25/19 1658 04/26/19 0555 04/26/19 0811 04/27/19 0342 04/27/19 0553 04/27/19 1233  NA 134*   < > 136  --  143 145 147* 150* 149*  K 3.4*   < > 3.9  --  4.0 4.1 3.6 3.8 4.1  CL 99  --  101  --  109  --   --  110  --   CO2 20*  --  22  --  25  --   --  29  --   GLUCOSE 118*  --  197*  --  170*  --   --  148*  --   BUN 18  --  26*  --  30*  --   --  30*  --   CREATININE 1.36*  --  1.10  --  0.99  --   --  0.81  --   CALCIUM 8.3*  --  8.1*  --   8.1*  --   --  8.8*  --   MG  --   --  2.1 2.2 2.4  --   --  2.0  --   PHOS  --   --  3.7 2.8 1.8*  --   --  2.1*  --    < > = values in this interval not displayed.   GFR: Estimated Creatinine Clearance: 102.5 mL/min (by C-G formula based on SCr of 0.81 mg/dL). Recent Labs  Lab 04/24/19 1803 04/24/19 2131 04/25/19 0615 04/26/19 0555 04/27/19 0553 04/27/19 1800  PROCALCITON  --  0.31  --   --   --   --   WBC 11.3*  --  12.0* 14.5* 14.3*  --   LATICACIDVEN  --   --   --   --   --  1.6    Liver Function Tests: Recent Labs  Lab 04/24/19 2212 04/25/19 0615 04/26/19 0555 04/27/19 0553  AST 81* 63* 51* 54*  ALT 58* 52* 43 46*  ALKPHOS 69 67 61 59  BILITOT 0.5 0.5 0.5 0.5  PROT 7.6 7.2 6.5 6.7  ALBUMIN 3.0* 2.9* 2.7* 2.7*   No results for input(s): LIPASE, AMYLASE in the last 168 hours. No results for input(s): AMMONIA in the last 168 hours.  ABG    Component Value Date/Time   PHART 7.400 04/27/2019 1233   PCO2ART 49.2 (H) 04/27/2019 1233   PO2ART 111.0 (H) 04/27/2019 1233   HCO3 30.3 (H) 04/27/2019 1233   TCO2 32 04/27/2019 1233   ACIDBASEDEF 3.0 (H) 04/25/2019 0510   O2SAT 98.0 04/27/2019 1233     Coagulation Profile: Recent Labs  Lab 04/25/19 1835 04/26/19 0555 04/27/19 0553  INR 1.1 1.2 1.1    Cardiac Enzymes: No results for input(s): CKTOTAL, CKMB, CKMBINDEX, TROPONINI in the last 168 hours.  HbA1C: Hgb A1c MFr Bld  Date/Time Value Ref Range Status  12/11/2012 11:10 AM 5.5 4.6 - 6.5 % Final    Comment:    Glycemic Control Guidelines for People with Diabetes:Non Diabetic:  <6%Goal of Therapy: <7%Additional Action Suggested:  >8%   01/16/2008 08:47 AM 5.7 4.6 - 6.0 % Final    Comment:    See lab report for associated comment(s)    CBG: Recent Labs  Lab 04/26/19 2005 04/26/19 2341  04/27/19 0808 04/27/19 1343 04/27/19 1630  GLUCAP 121* 122* 127* 140* 138*     Critical care time: 35 minutes

## 2019-04-27 NOTE — Progress Notes (Addendum)
PROGRESS NOTE    Dennis Moore  [id number] DOB: 10-19-1955 DOA: 04/24/2019 PCP: Ann Held, DO   Brief Narrative:  63 y.o. BM PMHx depression, HTN.,  HLD, BPH   From PCP records I can see that the patient called 10/7 with fevers and cough onset 10/6.  He was tested for COVID on 10/8 and the test came back positive (RT PCR, result visible to [place] under Care Everywhere > Lab results [place] > Viral tests).  Patient presented to the ED with worsening SOB, respiratory distress.  Was satting 11s on RA  Per EDP note: "Has had progressively worsening cough, now with shortness of breath for the past 2 or 3 days. Denies chest pain. Intermittent fevers. No abdominal pain. No vomiting, no diarrhea. Symptoms constant, moderate in severity, no exacerbating or alleviating factors."  ED Course: Sats were still 89% on NRB, tachypnic with RR 30-40s.  CXR showing multifocal bilateral PNA c/w COVID  EDP made decision to intubate patient and patient was intubated.  Hospitalist has been called to admit patient over to [facility] and PCCM has been consulted.  Subjective: 10/17 intubated/sedated     Assessment & Plan:   Principal Problem:   Acute respiratory distress syndrome (ARDS) due to COVID-19 virus [facility]) Active Problems:   Essential hypertension   Acute respiratory failure with hypoxia (HCC)   Depression   Acute on chronic respiratory failure with hypoxia (HCC)  Acute respiratory failure with hypoxia/Covid 19 pneumonia -Remdesivir per pharmacy protocol -Decadron 6 mg daily -Spoke with Mrs. Humm and counseled her on the use of Covid convalescent plasma.  Informed not approving therapy for treatment of Covid 19 although there is some data showing benefit from its use.  She has agreed to sign consent form and is on her way to the hospital as we speak. -10/15 transfuse 1 unit Covid convalescent plasma; once paperwork signed -Patient with leukocytosis,  possible bacterial infection hold on administration of Actemra -Prone patient for 16 hours/day, 8 hours off -Combivent QID -Adjust vent settings to remain within ARDS net guidelines -10/16 wean patient sedation medication as tolerated COVID-19 Labs  Recent Labs    04/24/19 2131 04/25/19 0615 04/26/19 0555 04/27/19 0553  DDIMER 1.23* 0.98* 0.89* 1.54*  FERRITIN 1,976*  --   --   --   LDH 380*  --   --   --   CRP 25.5* 23.0* 10.7* 8.1*    Lab Results  Component Value Date   SARSCOV2NAA POSITIVE (A) 04/24/2019   [place] Not Detected 02/21/2019   ARDS -See Covid pneumonia  Pneumonia? -Tracheal aspirate pending -Procalcitonin/lactic acid pending  Essential HTN -Patient borderline hypotensive hold BP medication -Would start Levophed PRN to maintain MAP>65  Depression -Patient currently sedated and intubated not an issue     DVT prophylaxis: Lovenox--> Covid Pact trial Code Status: Full Family Communication: 10/17 Dr Sherral Hammers spoke at length with Ms. Manley Mason concerning plan of care answered all questions  Disposition Plan: TBD   Consultants:  PCCM     Procedures/Significant Events:  10/15 PCXR; worsening multifocal pneumonia     I have personally reviewed and interpreted all radiology studies and my findings are as above.  VENTILATOR SETTINGS: Vent mode; PRVC Set rate; 24 Vt Set; 470 FiO2; 60% PEEP; 12   Cultures 10/14 blood LEFT forearm positive coag negative staph (most likely contaminant) 10/14 SARS coronavirus positive 10/17 tracheal aspirate pending     Antimicrobials: Anti-infectives (From admission, onward)   Start  Stop   04/26/19 1000  remdesivir 100 mg in sodium chloride 0.9 % 250 mL IVPB     04/30/19 0959   04/25/19 0630  remdesivir 200 mg in sodium chloride 0.9 % 250 mL IVPB     04/25/19 0728   04/25/19 0000  levofloxacin (LEVAQUIN) IVPB 750 mg  Status:  Discontinued     04/25/19 1153       Devices    LINES / TUBES:       Continuous Infusions:  sodium chloride     sodium chloride 10 mL/hr at 04/27/19 1600   feeding supplement (VITAL 1.5 CAL) 40 mL/hr at 04/27/19 1600   fentaNYL infusion INTRAVENOUS 200 mcg/hr (04/27/19 1600)   midazolam 6 mg/hr (04/27/19 1600)   remdesivir 100 mg in NS 250 mL Stopped (04/27/19 1100)   vancomycin Stopped (04/27/19 1553)     Objective: Vitals:   04/27/19 1400 04/27/19 1500 04/27/19 1553 04/27/19 1600  BP: 123/67 122/67 126/67 120/60  Pulse: 63 65 66 66  Resp: (!) 24 (!) 24 (!) 27 (!) 24  Temp:    99 F (37.2 C)  TempSrc:    Oral  SpO2: 91% 92% 91% 91%  Weight:      Height:        Intake/Output Summary (Last 24 hours) at 04/27/2019 1736 Last data filed at 04/27/2019 1600 Gross per 24 hour  Intake 3441.77 ml  Output 3025 ml  Net 416.77 ml   Filed Weights   04/24/19 1807 04/26/19 0500 04/27/19 0500  Weight: 98 kg 98.1 kg 93 kg   Physical Exam:  General: Sedated/intubated, positive acute respiratory distress Eyes: negative scleral hemorrhage, negative anisocoria, negative icterus ENT: Negative Runny nose, negative gingival bleeding, #7.5 ETT tube in place, thick yellow sputum discharge from trachea Neck:  Negative scars, masses, torticollis, lymphadenopathy, JVD Lungs: Clear to auscultation bilaterally without wheezes or crackles Cardiovascular: Regular rate and rhythm without murmur gallop or rub normal S1 and S2 Abdomen: negative abdominal pain, nondistended, positive soft, bowel sounds, no rebound, no ascites, no appreciable mass Extremities: No significant cyanosis, clubbing, or edema bilateral lower extremities Skin: Negative rashes, lesions, ulcers Psychiatric: Intubated/sedated Central nervous system: Intubated/sedated      Data Reviewed: Care during the described time interval was provided by me .  I have reviewed this patient's available data, including medical history, events of note, physical examination, and all test results  as part of my evaluation.   CBC: Recent Labs  Lab 04/24/19 1803  04/25/19 0615 04/26/19 0555 04/26/19 0811 04/27/19 0342 04/27/19 0553 04/27/19 1233  WBC 11.3*  --  12.0* 14.5*  --   --  14.3*  --   NEUTROABS  --   --  11.1* 12.8*  --   --  11.6*  --   HGB 11.8*   < > 10.9* 10.1* 10.2* 10.5* 10.2* 11.9*  HCT 32.4*   < > 33.0* 30.8* 30.0* 31.0* 32.1* 35.0*  MCV 86.6  --  89.2 92.2  --   --  93.0  --   PLT 300  --  331 340  --   --  341  --    < > = values in this interval not displayed.   Basic Metabolic Panel: Recent Labs  Lab 04/24/19 1803  04/25/19 0615 04/25/19 1658 04/26/19 0555 04/26/19 0811 04/27/19 0342 04/27/19 0553 04/27/19 1233  NA 134*   < > 136  --  143 145 147* 150* 149*  K 3.4*   < >  3.9  --  4.0 4.1 3.6 3.8 4.1  CL 99  --  101  --  109  --   --  110  --   CO2 20*  --  22  --  25  --   --  29  --   GLUCOSE 118*  --  197*  --  170*  --   --  148*  --   BUN 18  --  26*  --  30*  --   --  30*  --   CREATININE 1.36*  --  1.10  --  0.99  --   --  0.81  --   CALCIUM 8.3*  --  8.1*  --  8.1*  --   --  8.8*  --   MG  --   --  2.1 2.2 2.4  --   --  2.0  --   PHOS  --   --  3.7 2.8 1.8*  --   --  2.1*  --    < > = values in this interval not displayed.   GFR: Estimated Creatinine Clearance: 102.5 mL/min (by C-G formula based on SCr of 0.81 mg/dL). Liver Function Tests: Recent Labs  Lab 04/24/19 2212 04/25/19 0615 04/26/19 0555 04/27/19 0553  AST 81* 63* 51* 54*  ALT 58* 52* 43 46*  ALKPHOS 69 67 61 59  BILITOT 0.5 0.5 0.5 0.5  PROT 7.6 7.2 6.5 6.7  ALBUMIN 3.0* 2.9* 2.7* 2.7*   No results for input(s): LIPASE, AMYLASE in the last 168 hours. No results for input(s): AMMONIA in the last 168 hours. Coagulation Profile: Recent Labs  Lab 04/25/19 1835 04/26/19 0555 04/27/19 0553  INR 1.1 1.2 1.1   Cardiac Enzymes: No results for input(s): CKTOTAL, CKMB, CKMBINDEX, TROPONINI in the last 168 hours. BNP (last 3 results) No results for input(s):  PROBNP in the last 8760 hours. HbA1C: No results for input(s): HGBA1C in the last 72 hours. CBG: Recent Labs  Lab 04/26/19 2005 04/26/19 2341 04/27/19 0808 04/27/19 1343 04/27/19 1630  GLUCAP 121* 122* 127* 140* 138*   Lipid Profile: Recent Labs    04/24/19 2131  TRIG 194*   Thyroid Function Tests: No results for input(s): TSH, T4TOTAL, FREET4, T3FREE, THYROIDAB in the last 72 hours. Anemia Panel: Recent Labs    04/24/19 2131  FERRITIN 1,976*   Urine analysis:    Component Value Date/Time   COLORURINE YELLOW 01/05/2010 2000   APPEARANCEUR CLEAR 01/05/2010 2000   LABSPEC 1.027 01/05/2010 2000   PHURINE 8.5 (H) 01/05/2010 2000   GLUCOSEU NEGATIVE 01/05/2010 2000   HGBUR NEGATIVE 01/05/2010 2000   HGBUR negative 12/04/2008 0802   BILIRUBINUR neg 08/25/2015 1206   KETONESUR NEGATIVE 01/05/2010 2000   PROTEINUR neg 08/25/2015 1206   PROTEINUR 30 (A) 01/05/2010 2000   UROBILINOGEN 0.2 08/25/2015 1206   UROBILINOGEN 1.0 01/05/2010 2000   NITRITE neg 08/25/2015 1206   NITRITE NEGATIVE 01/05/2010 2000   LEUKOCYTESUR Negative 08/25/2015 1206   Sepsis Labs: @LABRCNTIP (procalcitonin:4,lacticidven:4)  ) Recent Results (from the past 240 hour(s))  SARS CORONAVIRUS 2 (TAT 6-24 HRS)     Status: Abnormal   Collection Time: 04/24/19  7:06 PM  Result Value Ref Range Status   SARS Coronavirus 2 POSITIVE (A) NEGATIVE Final    Comment: RESULT CALLED TO, READ BACK BY AND VERIFIED WITH: RN H BILLITER @ [phone] 04/25/19 BY S GEZAHEGN (NOTE) SARS-CoV-2 target nucleic acids are DETECTED. The SARS-CoV-2 RNA is generally detectable in  upper and lower respiratory specimens during the acute phase of infection. Positive results are indicative of active infection with SARS-CoV-2. Clinical  correlation with patient history and other diagnostic information is necessary to determine patient infection status. Positive results do  not rule out bacterial infection or co-infection with other  viruses. The expected result is Negative. Fact Sheet for Patients: SugarRoll.be Fact Sheet for Healthcare Providers: https://www.-mathews.com/ This test is not yet approved or cleared by the [place] FDA and  has been authorized for detection and/or diagnosis of SARS-CoV-2 by FDA under an Emergency Use Authorization (EUA). This EUA will remain  in effect (meaning this test can be use d) for the duration of the COVID-19 declaration under Section 564(b)(1) of the Act, 21 U.S.C. section 360bbb-3(b)(1), unless the authorization is terminated or revoked sooner. Performed at [facility], [place] [address], [place], [place] [address]   Culture, blood (single) w Reflex to ID Panel     Status: Abnormal   Collection Time: 04/24/19 10:00 PM   Specimen: BLOOD LEFT FOREARM  Result Value Ref Range Status   Specimen Description BLOOD LEFT FOREARM  Final   Special Requests   Final    BOTTLES DRAWN AEROBIC AND ANAEROBIC Blood Culture adequate volume   Culture  Setup Time   Final    IN BOTH AEROBIC AND ANAEROBIC BOTTLES GRAM POSITIVE COCCI CRITICAL RESULT CALLED TO, READ BACK BY AND VERIFIED WITH: N GLOGOVAC PHARMD 04/25/19 2230 JDW    Culture (A)  Final    STAPHYLOCOCCUS SPECIES (COAGULASE NEGATIVE) THE SIGNIFICANCE OF ISOLATING THIS ORGANISM FROM A SINGLE SET OF BLOOD CULTURES WHEN MULTIPLE SETS ARE DRAWN IS UNCERTAIN. PLEASE NOTIFY THE MICROBIOLOGY DEPARTMENT WITHIN ONE WEEK IF SPECIATION AND SENSITIVITIES ARE REQUIRED. MICROCOCCUS SPECIES Standardized susceptibility testing for this organism is not available. Performed at [facility], [place] [address], [place], [place] [address]    Report Status 04/27/2019 FINAL  Final  MRSA PCR Screening     Status: None   Collection Time: 04/26/19  1:00 PM   Specimen: Nasopharyngeal  Result Value Ref Range Status   MRSA by PCR NEGATIVE NEGATIVE Final    Comment:        The GeneXpert MRSA Assay  (FDA approved for NASAL specimens only), is one component of a comprehensive MRSA colonization surveillance program. It is not intended to diagnose MRSA infection nor to guide or monitor treatment for MRSA infections. Performed at [facility], [place] [address] [place], [place] [address]   Culture, respiratory (non-expectorated)     Status: None (Preliminary result)   Collection Time: 04/27/19 10:54 AM   Specimen: Tracheal Aspirate; Respiratory  Result Value Ref Range Status   Specimen Description   Final    TRACHEAL ASPIRATE Performed at [place] [address], [place], [place] [address]    Special Requests   Final    NONE Performed at [facility], [place] [address], [place], [place] [address]    Gram Stain   Final    FEW WBC PRESENT, PREDOMINANTLY PMN FEW GRAM POSITIVE COCCI IN PAIRS RARE GRAM POSITIVE RODS Performed at [facility], [place] [address] [place], [place] [address]    Culture PENDING  Incomplete   Report Status PENDING  Incomplete         Radiology Studies: Dg Chest Port 1 View  Result Date: 04/27/2019 CLINICAL DATA:  Follow-up coronavirus pneumonia. EXAM: PORTABLE CHEST 1 VIEW COMPARISON:  04/25/2019 FINDINGS:  Endotracheal tube tip is 2 cm above the carina. Orogastric or nasogastric tube enters the abdomen. Widespread bilateral pulmonary infiltrates persist, similar to the previous study. No worsening or new finding. IMPRESSION: 1. Endotracheal tube 2 cm above the carina. 2. No change in bilateral pulmonary infiltrates. Electronically Signed   By: Nelson Chimes M.D.   On: 04/27/2019 10:21        Scheduled Meds:  sodium chloride   Intravenous Once   chlorhexidine  15 mL Mouth/Throat BID   Chlorhexidine Gluconate Cloth  6 each Topical Daily   dexamethasone (DECADRON) injection  6 mg Intravenous Q24H   enoxaparin (LOVENOX) injection  40 mg Subcutaneous Q24H   etomidate  30 mg  Intravenous Once   feeding supplement (PRO-STAT SUGAR FREE 64)  60 mL Per Tube TID   fentaNYL (SUBLIMAZE) injection  50 mcg Intravenous Once   insulin aspart  0-9 Units Subcutaneous Q4H   ipratropium-albuterol  3 mL Nebulization BID   mouth rinse  15 mL Mouth Rinse 10 times per day   pantoprazole sodium  40 mg Per Tube Daily   polyethylene glycol  17 g Per Tube Daily   succinylcholine  150 mg Intravenous Once   Continuous Infusions:  sodium chloride     sodium chloride 10 mL/hr at 04/27/19 1600   feeding supplement (VITAL 1.5 CAL) 40 mL/hr at 04/27/19 1600   fentaNYL infusion INTRAVENOUS 200 mcg/hr (04/27/19 1600)   midazolam 6 mg/hr (04/27/19 1600)   remdesivir 100 mg in NS 250 mL Stopped (04/27/19 1100)   vancomycin Stopped (04/27/19 1553)     LOS: 3 days   The patient is critically ill with multiple organ systems failure and requires high complexity decision making for assessment and support, frequent evaluation and titration of therapies, application of advanced monitoring technologies and extensive interpretation of multiple databases. Critical Care Time devoted to patient care services described in this note  Time spent: 40 minutes     Rodney Wigger, Geraldo Docker, MD [facility] Pager [phone]  If 7PM-7AM, please contact night-coverage [email or web address] Password [facility] 04/27/2019, 5:36 PM

## 2019-04-27 NOTE — Progress Notes (Signed)
Arterial blood gas drawn on the following settings: PRVC/24/470/50%/+12

## 2019-04-28 DIAGNOSIS — I10 Essential (primary) hypertension: Secondary | ICD-10-CM

## 2019-04-28 DIAGNOSIS — J95851 Ventilator associated pneumonia: Secondary | ICD-10-CM | POA: Diagnosis not present

## 2019-04-28 LAB — POCT I-STAT 7, (LYTES, BLD GAS, ICA,H+H)
Acid-Base Excess: 6 mmol/L — ABNORMAL HIGH (ref 0.0–2.0)
Bicarbonate: 31.2 mmol/L — ABNORMAL HIGH (ref 20.0–28.0)
Calcium, Ion: 1.29 mmol/L (ref 1.15–1.40)
HCT: 45 % (ref 39.0–52.0)
Hemoglobin: 15.3 g/dL (ref 13.0–17.0)
O2 Saturation: 93 %
Patient temperature: 37.8
Potassium: 3.6 mmol/L (ref 3.5–5.1)
Sodium: 149 mmol/L — ABNORMAL HIGH (ref 135–145)
TCO2: 33 mmol/L — ABNORMAL HIGH (ref 22–32)
pCO2 arterial: 49.2 mmHg — ABNORMAL HIGH (ref 32.0–48.0)
pH, Arterial: 7.413 (ref 7.350–7.450)
pO2, Arterial: 70 mmHg — ABNORMAL LOW (ref 83.0–108.0)

## 2019-04-28 LAB — CBC WITH DIFFERENTIAL/PLATELET
Abs Immature Granulocytes: 0.9 10*3/uL — ABNORMAL HIGH (ref 0.00–0.07)
Basophils Absolute: 0.2 10*3/uL — ABNORMAL HIGH (ref 0.0–0.1)
Basophils Relative: 1 %
Eosinophils Absolute: 0 10*3/uL (ref 0.0–0.5)
Eosinophils Relative: 0 %
HCT: 31.9 % — ABNORMAL LOW (ref 39.0–52.0)
Hemoglobin: 10.3 g/dL — ABNORMAL LOW (ref 13.0–17.0)
Immature Granulocytes: 5 %
Lymphocytes Relative: 9 %
Lymphs Abs: 1.6 10*3/uL (ref 0.7–4.0)
MCH: 30.1 pg (ref 26.0–34.0)
MCHC: 32.3 g/dL (ref 30.0–36.0)
MCV: 93.3 fL (ref 80.0–100.0)
Monocytes Absolute: 1.4 10*3/uL — ABNORMAL HIGH (ref 0.1–1.0)
Monocytes Relative: 8 %
Neutro Abs: 14.4 10*3/uL — ABNORMAL HIGH (ref 1.7–7.7)
Neutrophils Relative %: 77 %
Platelets: 415 10*3/uL — ABNORMAL HIGH (ref 150–400)
RBC: 3.42 MIL/uL — ABNORMAL LOW (ref 4.22–5.81)
RDW: 15.8 % — ABNORMAL HIGH (ref 11.5–15.5)
WBC: 18.4 10*3/uL — ABNORMAL HIGH (ref 4.0–10.5)
nRBC: 0 % (ref 0.0–0.2)

## 2019-04-28 LAB — COMPREHENSIVE METABOLIC PANEL
ALT: 50 U/L — ABNORMAL HIGH (ref 0–44)
AST: 47 U/L — ABNORMAL HIGH (ref 15–41)
Albumin: 2.6 g/dL — ABNORMAL LOW (ref 3.5–5.0)
Alkaline Phosphatase: 60 U/L (ref 38–126)
Anion gap: 10 (ref 5–15)
BUN: 28 mg/dL — ABNORMAL HIGH (ref 8–23)
CO2: 30 mmol/L (ref 22–32)
Calcium: 9.3 mg/dL (ref 8.9–10.3)
Chloride: 111 mmol/L (ref 98–111)
Creatinine, Ser: 0.78 mg/dL (ref 0.61–1.24)
GFR calc Af Amer: >60 mL/min (ref >60)
GFR calc non Af Amer: >60 mL/min (ref >60)
Glucose, Bld: 122 mg/dL — ABNORMAL HIGH (ref 70–99)
Potassium: 3.8 mmol/L (ref 3.5–5.1)
Sodium: 151 mmol/L — ABNORMAL HIGH (ref 135–145)
Total Bilirubin: 0.4 mg/dL (ref 0.3–1.2)
Total Protein: 6.8 g/dL (ref 6.5–8.1)

## 2019-04-28 LAB — GLUCOSE, CAPILLARY
Glucose-Capillary: 126 mg/dL — ABNORMAL HIGH (ref 70–99)
Glucose-Capillary: 132 mg/dL — ABNORMAL HIGH (ref 70–99)
Glucose-Capillary: 152 mg/dL — ABNORMAL HIGH (ref 70–99)
Glucose-Capillary: 159 mg/dL — ABNORMAL HIGH (ref 70–99)
Glucose-Capillary: 148 mg/dL — ABNORMAL HIGH (ref 70–99)
Glucose-Capillary: 138 mg/dL — ABNORMAL HIGH (ref 70–99)

## 2019-04-28 LAB — PHOSPHORUS: Phosphorus: 2.6 mg/dL (ref 2.5–4.6)

## 2019-04-28 LAB — PROTIME-INR
INR: 1.1 (ref 0.8–1.2)
Prothrombin Time: 14.5 seconds (ref 11.4–15.2)

## 2019-04-28 LAB — VANCOMYCIN, TROUGH: Vancomycin Tr: 13 ug/mL — ABNORMAL LOW (ref 15–20)

## 2019-04-28 LAB — C-REACTIVE PROTEIN: CRP: 15.2 mg/dL — ABNORMAL HIGH (ref <1.0)

## 2019-04-28 LAB — MAGNESIUM: Magnesium: 2 mg/dL (ref 1.7–2.4)

## 2019-04-28 LAB — D-DIMER, QUANTITATIVE: D-Dimer, Quant: 1.89 ug/mL-FEU — ABNORMAL HIGH (ref 0.00–0.50)

## 2019-04-28 MED ORDER — SODIUM CHLORIDE 0.9 % IV SOLN
2.0000 g | Freq: Three times a day (TID) | INTRAVENOUS | Status: DC
  Filled 2019-04-28 (×2): qty 2

## 2019-04-28 MED ORDER — SODIUM CHLORIDE 0.9 % IV SOLN
2.0000 g | INTRAVENOUS | Status: DC
  Administered 2019-04-28 – 2019-04-29 (×2): 2 g via INTRAVENOUS
  Filled 2019-04-28 (×2): qty 20

## 2019-04-28 MED ORDER — SENNOSIDES 8.8 MG/5ML PO SYRP: 15.0000 mL | ORAL_SOLUTION | Freq: Two times a day (BID) | ORAL | Status: DC

## 2019-04-28 MED ORDER — FUROSEMIDE 10 MG/ML IJ SOLN
40.0000 mg | Freq: Once | INTRAMUSCULAR | Status: AC
  Administered 2019-04-28: 40 mg via INTRAVENOUS
  Filled 2019-04-28: qty 4

## 2019-04-28 NOTE — Progress Notes (Signed)
PROGRESS NOTE    Dennis Moore  [id number] DOB: 07/20/1955 DOA: 04/24/2019 PCP: Ann Held, DO   Brief Narrative:  63 y.o. BM PMHx depression, HTN.,  HLD, BPH   From PCP records I can see that the patient called 10/7 with fevers and cough onset 10/6.  He was tested for COVID on 10/8 and the test came back positive (RT PCR, result visible to [place] under Care Everywhere > Lab results [facility] > Viral tests).  Patient presented to the ED with worsening SOB, respiratory distress.  Was satting 46s on RA  Per EDP note: "Has had progressively worsening cough, now with shortness of breath for the past 2 or 3 days. Denies chest pain. Intermittent fevers. No abdominal pain. No vomiting, no diarrhea. Symptoms constant, moderate in severity, no exacerbating or alleviating factors."  ED Course: Sats were still 89% on NRB, tachypnic with RR 30-40s.  CXR showing multifocal bilateral PNA c/w COVID  EDP made decision to intubate patient and patient was intubated.  Hospitalist has been called to admit patient over to [facility] and PCCM has been consulted.  Subjective: 10/18 intubated/sedated.  Reacts to painful stimuli with slight withdrawal.   Assessment & Plan:   Principal Problem:   Acute respiratory distress syndrome (ARDS) due to COVID-19 virus [facility]) Active Problems:   Essential hypertension   Acute respiratory failure with hypoxia (HCC)   Depression   Acute on chronic respiratory failure with hypoxia (HCC)   VAP (ventilator-associated pneumonia) (HCC)  Acute respiratory failure with hypoxia/Covid 19 pneumonia -Remdesivir per pharmacy protocol -Decadron 6 mg daily -Spoke with Mrs. Ondo and counseled her on the use of Covid convalescent plasma.  Informed not approving therapy for treatment of Covid 19 although there is some data showing benefit from its use.  She has agreed to sign consent form and is on her way to the hospital as we speak. -10/15  transfuse 1 unit Covid convalescent plasma; once paperwork signed -Patient with leukocytosis, possible bacterial infection hold on administration of Actemra -Prone patient for 16 hours/day, 8 hours off -Combivent QID -Adjust vent settings to remain within ARDS net guidelines -10/16 wean patient sedation medication as tolerated -10/18 continuing to wean patient daily as tolerated -10/18 Lasix 40 mg x 1  COVID-19 Labs  Recent Labs    04/26/19 0555 04/27/19 0553 04/28/19 0548  DDIMER 0.89* 1.54* 1.89*  CRP 10.7* 8.1* 15.2*    Lab Results  Component Value Date   SARSCOV2NAA POSITIVE (A) 04/24/2019   [place] Not Detected 02/21/2019   ARDS -See Covid pneumonia  VAP Pneumonia? -Tracheal aspirate pending -Although procalcitonin and lactic acid negative patient with thick yellow sputum we will start empirically on ceftriaxone.  Essential HTN -Patient borderline hypotensive hold BP medication -Would start Levophed PRN to maintain MAP>65  Depression -Patient currently sedated and intubated not an issue     DVT prophylaxis: Lovenox--> Covid Pact trial Code Status: Full Family Communication: 10/18 Dr Marchelle Gearing will speak with family   Disposition Plan: TBD   Consultants:  PCCM     Procedures/Significant Events:  10/15 PCXR; worsening multifocal pneumonia     I have personally reviewed and interpreted all radiology studies and my findings are as above.  VENTILATOR SETTINGS: Vent mode; PRVC Set rate; 24 Vt Set; 470 FiO2; 40% PEEP; 12   Cultures 10/14 blood LEFT forearm positive coag negative staph (most likely contaminant) 10/14 SARS coronavirus positive 10/17 tracheal aspirate tracheal aspirate positive cocci in pairs/GBR  Antimicrobials: Anti-infectives (From admission, onward)   Start     Stop   04/28/19 1300  cefTRIAXone (ROCEPHIN) 2 g in sodium chloride 0.9 % 100 mL IVPB         04/28/19 1200  aztreonam (AZACTAM) 2 g in sodium  chloride 0.9 % 100 mL IVPB  Status:  Discontinued     04/28/19 1252   04/26/19 1200  vancomycin (VANCOCIN) 1,250 mg in sodium chloride 0.9 % 250 mL IVPB  Status:  Discontinued     04/28/19 1253   04/26/19 1000  remdesivir 100 mg in sodium chloride 0.9 % 250 mL IVPB     04/30/19 0959   04/25/19 2345  vancomycin (VANCOCIN) 2,000 mg in sodium chloride 0.9 % 500 mL IVPB     04/26/19 0148   04/25/19 0630  remdesivir 200 mg in sodium chloride 0.9 % 250 mL IVPB     04/25/19 0728   04/25/19 0000  levofloxacin (LEVAQUIN) IVPB 750 mg  Status:  Discontinued     04/25/19 1153       Devices    LINES / TUBES:      Continuous Infusions: . sodium chloride    . sodium chloride Stopped (04/28/19 1404)  . cefTRIAXone (ROCEPHIN)  IV 2 g (04/28/19 1404)  . feeding supplement (VITAL 1.5 CAL) 40 mL/hr at 04/28/19 1200  . fentaNYL infusion INTRAVENOUS 75 mcg/hr (04/28/19 1404)  . midazolam 2 mg/hr (04/28/19 1404)  . remdesivir 100 mg in NS 250 mL Stopped (04/28/19 0837)     Objective: Vitals:   04/28/19 1300 04/28/19 1400 04/28/19 1500 04/28/19 1626  BP: (!) 154/78 (!) 147/73 138/74 (!) 145/76  Pulse: 71 64 63 65  Resp: (!) 25 (!) 24 (!) 25 (!) 22  Temp:      TempSrc:      SpO2: 95% (!) 89% 94% 96%  Weight:      Height:        Intake/Output Summary (Last 24 hours) at 04/28/2019 1639 Last data filed at 04/28/2019 1404 Gross per 24 hour  Intake 1785.87 ml  Output 1700 ml  Net 85.87 ml   Filed Weights   04/26/19 0500 04/27/19 0500 04/28/19 0500  Weight: 98.1 kg 93 kg 93.3 kg   Physical Exam:  General: Intubated/sedated, positive acute respiratory distress Eyes: negative scleral hemorrhage, negative anisocoria, negative icterus ENT: Negative Runny nose, negative gingival bleeding, #7.5 ETT tube in place, thick yellow sputum discharge from ETT Neck:  Negative scars, masses, torticollis, lymphadenopathy, JVD Lungs: Tachypneic clear to auscultation bilaterally without wheezes or  crackles Cardiovascular: Regular rate and rhythm without murmur gallop or rub normal S1 and S2 Abdomen: negative abdominal pain, nondistended, positive soft, bowel sounds, no rebound, no ascites, no appreciable mass Extremities: No significant cyanosis, clubbing, or edema bilateral lower extremities Skin: Negative rashes, lesions, ulcers Psychiatric: Intubated sedated   Central nervous system: Withdraws to painful stimuli minimally        Data Reviewed: Care during the described time interval was provided by me .  I have reviewed this patient's available data, including medical history, events of note, physical examination, and all test results as part of my evaluation.   CBC: Recent Labs  Lab 04/24/19 1803  04/25/19 0615 04/26/19 0555  04/27/19 0342 04/27/19 0553 04/27/19 1233 04/28/19 0548 04/28/19 0809  WBC 11.3*  --  12.0* 14.5*  --   --  14.3*  --  18.4*  --   NEUTROABS  --   --  11.1*  12.8*  --   --  11.6*  --  14.4*  --   HGB 11.8*   < > 10.9* 10.1*   < > 10.5* 10.2* 11.9* 10.3* 15.3  HCT 32.4*   < > 33.0* 30.8*   < > 31.0* 32.1* 35.0* 31.9* 45.0  MCV 86.6  --  89.2 92.2  --   --  93.0  --  93.3  --   PLT 300  --  331 340  --   --  341  --  415*  --    < > = values in this interval not displayed.   Basic Metabolic Panel: Recent Labs  Lab 04/24/19 1803  04/25/19 0615 04/25/19 1658 04/26/19 0555  04/27/19 0342 04/27/19 0553 04/27/19 1233 04/28/19 0548 04/28/19 0809  NA 134*   < > 136  --  143   < > 147* 150* 149* 151* 149*  K 3.4*   < > 3.9  --  4.0   < > 3.6 3.8 4.1 3.8 3.6  CL 99  --  101  --  109  --   --  110  --  111  --   CO2 20*  --  22  --  25  --   --  29  --  30  --   GLUCOSE 118*  --  197*  --  170*  --   --  148*  --  122*  --   BUN 18  --  26*  --  30*  --   --  30*  --  28*  --   CREATININE 1.36*  --  1.10  --  0.99  --   --  0.81  --  0.78  --   CALCIUM 8.3*  --  8.1*  --  8.1*  --   --  8.8*  --  9.3  --   MG  --   --  2.1 2.2 2.4  --   --   2.0  --  2.0  --   PHOS  --   --  3.7 2.8 1.8*  --   --  2.1*  --  2.6  --    < > = values in this interval not displayed.   GFR: Estimated Creatinine Clearance: 112.2 mL/min (by C-G formula based on SCr of 0.78 mg/dL). Liver Function Tests: Recent Labs  Lab 04/24/19 2212 04/25/19 0615 04/26/19 0555 04/27/19 0553 04/28/19 0548  AST 81* 63* 51* 54* 47*  ALT 58* 52* 43 46* 50*  ALKPHOS 69 67 61 59 60  BILITOT 0.5 0.5 0.5 0.5 0.4  PROT 7.6 7.2 6.5 6.7 6.8  ALBUMIN 3.0* 2.9* 2.7* 2.7* 2.6*   No results for input(s): LIPASE, AMYLASE in the last 168 hours. No results for input(s): AMMONIA in the last 168 hours. Coagulation Profile: Recent Labs  Lab 04/25/19 1835 04/26/19 0555 04/27/19 0553 04/28/19 0548  INR 1.1 1.2 1.1 1.1   Cardiac Enzymes: No results for input(s): CKTOTAL, CKMB, CKMBINDEX, TROPONINI in the last 168 hours. BNP (last 3 results) No results for input(s): PROBNP in the last 8760 hours. HbA1C: No results for input(s): HGBA1C in the last 72 hours. CBG: Recent Labs  Lab 04/27/19 2346 04/28/19 0329 04/28/19 0736 04/28/19 1125 04/28/19 1620  GLUCAP 126* 126* 132* 152* 159*   Lipid Profile: No results for input(s): CHOL, HDL, LDLCALC, TRIG, CHOLHDL, LDLDIRECT in the last 72 hours. Thyroid Function Tests: No results for input(s): TSH, T4TOTAL,  FREET4, T3FREE, THYROIDAB in the last 72 hours. Anemia Panel: No results for input(s): VITAMINB12, FOLATE, FERRITIN, TIBC, IRON, RETICCTPCT in the last 72 hours. Urine analysis:    Component Value Date/Time   COLORURINE YELLOW 01/05/2010 2000   APPEARANCEUR CLEAR 01/05/2010 2000   LABSPEC 1.027 01/05/2010 2000   PHURINE 8.5 (H) 01/05/2010 2000   GLUCOSEU NEGATIVE 01/05/2010 2000   HGBUR NEGATIVE 01/05/2010 2000   HGBUR negative 12/04/2008 0802   BILIRUBINUR neg 08/25/2015 1206   KETONESUR NEGATIVE 01/05/2010 2000   PROTEINUR neg 08/25/2015 1206   PROTEINUR 30 (A) 01/05/2010 2000   UROBILINOGEN 0.2 08/25/2015  1206   UROBILINOGEN 1.0 01/05/2010 2000   NITRITE neg 08/25/2015 1206   NITRITE NEGATIVE 01/05/2010 2000   LEUKOCYTESUR Negative 08/25/2015 1206   Sepsis Labs: @LABRCNTIP (procalcitonin:4,lacticidven:4)  ) Recent Results (from the past 240 hour(s))  SARS CORONAVIRUS 2 (TAT 6-24 HRS)     Status: Abnormal   Collection Time: 04/24/19  7:06 PM  Result Value Ref Range Status   SARS Coronavirus 2 POSITIVE (A) NEGATIVE Final    Comment: RESULT CALLED TO, READ BACK BY AND VERIFIED WITH: RN H BILLITER @ [phone] 04/25/19 BY S GEZAHEGN (NOTE) SARS-CoV-2 target nucleic acids are DETECTED. The SARS-CoV-2 RNA is generally detectable in upper and lower respiratory specimens during the acute phase of infection. Positive results are indicative of active infection with SARS-CoV-2. Clinical  correlation with patient history and other diagnostic information is necessary to determine patient infection status. Positive results do  not rule out bacterial infection or co-infection with other viruses. The expected result is Negative. Fact Sheet for Patients: SugarRoll.be Fact Sheet for Healthcare Providers: https://www.-mathews.com/ This test is not yet approved or cleared by the [place] FDA and  has been authorized for detection and/or diagnosis of SARS-CoV-2 by FDA under an Emergency Use Authorization (EUA). This EUA will remain  in effect (meaning this test can be use d) for the duration of the COVID-19 declaration under Section 564(b)(1) of the Act, 21 U.S.C. section 360bbb-3(b)(1), unless the authorization is terminated or revoked sooner. Performed at [facility], [place] [address], [place], [place] [address]   Culture, blood (single) w Reflex to ID Panel     Status: Abnormal   Collection Time: 04/24/19 10:00 PM   Specimen: BLOOD LEFT FOREARM  Result Value Ref Range Status   Specimen Description BLOOD LEFT FOREARM  Final   Special Requests    Final    BOTTLES DRAWN AEROBIC AND ANAEROBIC Blood Culture adequate volume   Culture  Setup Time   Final    IN BOTH AEROBIC AND ANAEROBIC BOTTLES GRAM POSITIVE COCCI CRITICAL RESULT CALLED TO, READ BACK BY AND VERIFIED WITH: N GLOGOVAC PHARMD 04/25/19 2230 JDW    Culture (A)  Final    STAPHYLOCOCCUS SPECIES (COAGULASE NEGATIVE) THE SIGNIFICANCE OF ISOLATING THIS ORGANISM FROM A SINGLE SET OF BLOOD CULTURES WHEN MULTIPLE SETS ARE DRAWN IS UNCERTAIN. PLEASE NOTIFY THE MICROBIOLOGY DEPARTMENT WITHIN ONE WEEK IF SPECIATION AND SENSITIVITIES ARE REQUIRED. MICROCOCCUS SPECIES Standardized susceptibility testing for this organism is not available. Performed at [facility], [place] [address] [place], [place] [address]    Report Status 04/27/2019 FINAL  Final  MRSA PCR Screening     Status: None   Collection Time: 04/26/19  1:00 PM   Specimen: Nasopharyngeal  Result Value Ref Range Status   MRSA by PCR NEGATIVE NEGATIVE Final    Comment:        The GeneXpert  MRSA Assay (FDA approved for NASAL specimens only), is one component of a comprehensive MRSA colonization surveillance program. It is not intended to diagnose MRSA infection nor to guide or monitor treatment for MRSA infections. Performed at [facility], [place] [address] [place], [place] [address]   Culture, respiratory (non-expectorated)     Status: None (Preliminary result)   Collection Time: 04/27/19 10:54 AM   Specimen: Tracheal Aspirate; Respiratory  Result Value Ref Range Status   Specimen Description   Final    TRACHEAL ASPIRATE Performed at [place] [address] [place], [place] [address]    Special Requests   Final    NONE Performed at [facility], [place] [address], [place], [place] [address]    Gram Stain   Final    FEW WBC PRESENT, PREDOMINANTLY PMN FEW GRAM POSITIVE COCCI IN PAIRS RARE GRAM POSITIVE RODS    Culture   Final    CULTURE  REINCUBATED FOR BETTER GROWTH Performed at [facility], [place] [address] [place], [place] [address]    Report Status PENDING  Incomplete         Radiology Studies: Dg Chest Port 1 View  Result Date: 04/27/2019 CLINICAL DATA:  Follow-up coronavirus pneumonia. EXAM: PORTABLE CHEST 1 VIEW COMPARISON:  04/25/2019 FINDINGS: Endotracheal tube tip is 2 cm above the carina. Orogastric or nasogastric tube enters the abdomen. Widespread bilateral pulmonary infiltrates persist, similar to the previous study. No worsening or new finding. IMPRESSION: 1. Endotracheal tube 2 cm above the carina. 2. No change in bilateral pulmonary infiltrates. Electronically Signed   By: Nelson Chimes M.D.   On: 04/27/2019 10:21        Scheduled Meds: . sodium chloride   Intravenous Once  . chlorhexidine  15 mL Mouth/Throat BID  . Chlorhexidine Gluconate Cloth  6 each Topical Daily  . dexamethasone (DECADRON) injection  6 mg Intravenous Q24H  . enoxaparin (LOVENOX) injection  40 mg Subcutaneous Q24H  . etomidate  30 mg Intravenous Once  . feeding supplement (PRO-STAT SUGAR FREE 64)  60 mL Per Tube TID  . fentaNYL (SUBLIMAZE) injection  50 mcg Intravenous Once  . furosemide  40 mg Intravenous Once  . insulin aspart  0-9 Units Subcutaneous Q4H  . ipratropium-albuterol  3 mL Nebulization BID  . mouth rinse  15 mL Mouth Rinse 10 times per day  . pantoprazole sodium  40 mg Per Tube Daily  . polyethylene glycol  17 g Per Tube Daily  . succinylcholine  150 mg Intravenous Once   Continuous Infusions: . sodium chloride    . sodium chloride Stopped (04/28/19 1404)  . cefTRIAXone (ROCEPHIN)  IV 2 g (04/28/19 1404)  . feeding supplement (VITAL 1.5 CAL) 40 mL/hr at 04/28/19 1200  . fentaNYL infusion INTRAVENOUS 75 mcg/hr (04/28/19 1404)  . midazolam 2 mg/hr (04/28/19 1404)  . remdesivir 100 mg in NS 250 mL Stopped (04/28/19 0837)     LOS: 4 days   The patient is critically ill with multiple organ systems  failure and requires high complexity decision making for assessment and support, frequent evaluation and titration of therapies, application of advanced monitoring technologies and extensive interpretation of multiple databases. Critical Care Time devoted to patient care services described in this note  Time spent: 40 minutes     Elease Swarm, Geraldo Docker, MD [facility] Pager [phone]  If 7PM-7AM, please contact night-coverage [email or web address] Password [facility] 04/28/2019, 4:39 PM

## 2019-04-28 NOTE — Progress Notes (Addendum)
NAME:  Dennis Moore, MRN:  [id number], DOB:  07-May-1956, LOS: 4 ADMISSION DATE:  04/24/2019, CONSULTATION DATE:  10/14 REFERRING MD:  Alcario Drought, CHIEF COMPLAINT:  dyspnea   Brief History   63 year old male admitted with ARDS from COVID-19 pneumonia on April 24, 2019.   Past Medical History  BPH Depression Hyperlipidemia Hypertension  Significant Hospital Events   10/14 intubated ED, admission 10/15 prone  Consults:  PCCM  Procedures:  10/14 ETT >   Significant Diagnostic Tests:  No echo done CXR 10/17: Widespread bilateral pulmonary infiltrates persist, similar to the previous study.  Micro Data:  10/14 blood > coag negative staph 10/14 SARS COV 2> 10/14 resp cx > Gram stain FEW WBC PRESENT, PREDOMINANTLY PMN  FEW GRAM POSITIVE COCCI IN PAIRS  RARE GRAM POSITIVE RODS   Antimicrobials:  10/14 remdesivir >  10/14 decadron >  10/15 convalescent plasma >   vanc 10/16-10/18 Levofloxacin x1 10/14  Interim history/subjective:   PaO2/FIO2 175 (slight improvement) Some thick  White/yellow aspirate from ET tube  Objective   Blood pressure (!) 147/73, pulse 64, temperature 99.7 F (37.6 C), temperature source Oral, resp. rate (!) 24, height 6' (1.829 m), weight 93.3 kg, SpO2 (!) 89 %.    Vent Mode: PRVC FiO2 (%):  [40 %-50 %] 40 % Set Rate:  [24 bmp] 24 bmp Vt Set:  [470 mL] 470 mL PEEP:  [12 cmH20] 12 cmH20 Plateau Pressure:  [20 cmH20-23 cmH20] 20 cmH20   Intake/Output Summary (Last 24 hours) at 04/28/2019 1515 Last data filed at 04/28/2019 1404 Gross per 24 hour  Intake 2524.61 ml  Output 1900 ml  Net 624.61 ml  breathing over vent at 28 Filed Weights   04/26/19 0500 04/27/19 0500 04/28/19 0500  Weight: 98.1 kg 93 kg 93.3 kg   I/O +140  Examination:  General:  In bed on vent, grimaces, responds to voice minimally (sedated) HENT: NCAT ETT in place PULM: CTA B, vent supported breathing CV: RRR, no mgr GI: BS+, soft, nontender MSK: normal  bulk and tone Neuro: sedated on vent   October 17 chest x-ray images independently reviewed showing bilateral patchy airspace disease, - stable  Resolved Hospital Problem list     Assessment & Plan:  ARDS due to COVID 19 pneumonia: severe Continue mechanical ventilation per ARDS protocol Target TVol 6-8cc/kgIBW Target Plateau Pressure < 30cm H20 Target driving pressure less than 15 cm of water Target PaO2 55-65: titrate PEEP/FiO2 per protocol As long as PaO2 to FiO2 ratio is less than 1:150 position in prone position for 16 hours a day Check CVP daily if CVL in place Target CVP less than 4, diurese as necessary Ventilator associated pneumonia prevention protocol 10/18 plan: no need for prone positioning.PaO2 FIO2 slightly improving today (175)  Monitor I/O, will give lasix 40 IV now.   Persistent fever: improving slightly (last elevated temp 100 this am) Some sputum, gram stain with small wbc, GPC, GNR.   WBC slightly increased to 18 today from 14 (on steroids) Small chance of bacterial superinfection Will stop vanc (was for coag neg staph in 2 bottles), start ceftriaxone. Consider adding coverage for atypicals. Initial procal slightly elevated, now normal (but did receive abtx).    Metabolic alkalosis compensatory for mild resp acidosis. Increasing to 30 today.  Cont current RR 24.  .    Need for sedation for mechanical ventilation RASS goal -3 Start PAD protocol, fentanyl, versed infusion   Elevated NA - monitor (149 today) Best practice:  Diet: start tube feeding Pain/Anxiety/Delirium protocol (if indicated): yes, start today, RASS target -3 VAP protocol (if indicated): yes DVT prophylaxis: enrolled in [place] PACT, routine dose of lovenox GI prophylaxis: Pantoprazole for stress ulcer prophylaxis Glucose control: SSI Mobility: bed rest Code Status: full Family Communication: per [facility] Disposition: remain in ICU  Labs   CBC: Recent Labs  Lab 04/24/19 1803  04/25/19  0615 04/26/19 0555  04/27/19 0342 04/27/19 0553 04/27/19 1233 04/28/19 0548 04/28/19 0809  WBC 11.3*  --  12.0* 14.5*  --   --  14.3*  --  18.4*  --   NEUTROABS  --   --  11.1* 12.8*  --   --  11.6*  --  14.4*  --   HGB 11.8*   < > 10.9* 10.1*   < > 10.5* 10.2* 11.9* 10.3* 15.3  HCT 32.4*   < > 33.0* 30.8*   < > 31.0* 32.1* 35.0* 31.9* 45.0  MCV 86.6  --  89.2 92.2  --   --  93.0  --  93.3  --   PLT 300  --  331 340  --   --  341  --  415*  --    < > = values in this interval not displayed.    Basic Metabolic Panel: Recent Labs  Lab 04/24/19 1803  04/25/19 0615 04/25/19 1658 04/26/19 0555  04/27/19 0342 04/27/19 0553 04/27/19 1233 04/28/19 0548 04/28/19 0809  NA 134*   < > 136  --  143   < > 147* 150* 149* 151* 149*  K 3.4*   < > 3.9  --  4.0   < > 3.6 3.8 4.1 3.8 3.6  CL 99  --  101  --  109  --   --  110  --  111  --   CO2 20*  --  22  --  25  --   --  29  --  30  --   GLUCOSE 118*  --  197*  --  170*  --   --  148*  --  122*  --   BUN 18  --  26*  --  30*  --   --  30*  --  28*  --   CREATININE 1.36*  --  1.10  --  0.99  --   --  0.81  --  0.78  --   CALCIUM 8.3*  --  8.1*  --  8.1*  --   --  8.8*  --  9.3  --   MG  --   --  2.1 2.2 2.4  --   --  2.0  --  2.0  --   PHOS  --   --  3.7 2.8 1.8*  --   --  2.1*  --  2.6  --    < > = values in this interval not displayed.   GFR: Estimated Creatinine Clearance: 112.2 mL/min (by C-G formula based on SCr of 0.78 mg/dL). Recent Labs  Lab 04/24/19 2131 04/25/19 0615 04/26/19 0555 04/27/19 0553 04/27/19 1800 04/28/19 0548  PROCALCITON 0.31  --   --   --  <0.10  --   WBC  --  12.0* 14.5* 14.3*  --  18.4*  LATICACIDVEN  --   --   --   --  1.6  --     Liver Function Tests: Recent Labs  Lab 04/24/19 2212 04/25/19 0615 04/26/19 [id number] 04/27/19 0553 04/28/19 [id number]  AST 81* 63* 51* 54* 47*  ALT 58* 52* 43 46* 50*  ALKPHOS 69 67 61 59 60  BILITOT 0.5 0.5 0.5 0.5 0.4  PROT 7.6 7.2 6.5 6.7 6.8  ALBUMIN 3.0* 2.9* 2.7*  2.7* 2.6*   No results for input(s): LIPASE, AMYLASE in the last 168 hours. No results for input(s): AMMONIA in the last 168 hours.  ABG    Component Value Date/Time   PHART 7.413 04/28/2019 0809   PCO2ART 49.2 (H) 04/28/2019 0809   PO2ART 70.0 (L) 04/28/2019 0809   HCO3 31.2 (H) 04/28/2019 0809   TCO2 33 (H) 04/28/2019 0809   ACIDBASEDEF 3.0 (H) 04/25/2019 0510   O2SAT 93.0 04/28/2019 0809     Coagulation Profile: Recent Labs  Lab 04/25/19 1835 04/26/19 0555 04/27/19 0553 04/28/19 0548  INR 1.1 1.2 1.1 1.1    Cardiac Enzymes: No results for input(s): CKTOTAL, CKMB, CKMBINDEX, TROPONINI in the last 168 hours.  HbA1C: Hgb A1c MFr Bld  Date/Time Value Ref Range Status  12/11/2012 11:10 AM 5.5 4.6 - 6.5 % Final    Comment:    Glycemic Control Guidelines for People with Diabetes:Non Diabetic:  <6%Goal of Therapy: <7%Additional Action Suggested:  >8%   01/16/2008 08:47 AM 5.7 4.6 - 6.0 % Final    Comment:    See lab report for associated comment(s)    CBG: Recent Labs  Lab 04/27/19 1947 04/27/19 2346 04/28/19 0329 04/28/19 0736 04/28/19 1125  GLUCAP 118* 126* 126* 132* 152*     Critical care time: 35 minutes

## 2019-04-28 NOTE — Progress Notes (Signed)
Pharmacy Antibiotic Note  Dennis Moore is a 63 y.o. male admitted on 04/24/2019 with bacteremia.  Pharmacy has been consulted for vancomycin dosing.   Pt with drawn vancomycin peak which was 27 mch/ml and trough which is 13 mcg/ml.  Plan: Continue vancomycin 1250 mg IV q12h  Height: 6' (182.9 cm) Weight: 205 lb 0.4 oz (93 kg) IBW/kg (Calculated) : 77.6  Temp (24hrs), Avg:99.9 F (37.7 C), Min:98.8 F (37.1 C), Max:101.8 F (38.8 C)  Recent Labs  Lab 04/24/19 1803 04/25/19 0615 04/26/19 0555 04/27/19 0553 04/27/19 1400 04/27/19 1651 04/27/19 1800 04/27/19 2310  WBC 11.3* 12.0* 14.5* 14.3*  --   --   --   --   CREATININE 1.36* 1.10 0.99 0.81  --   --   --   --   LATICACIDVEN  --   --   --   --   --   --  1.6  --   VANCOTROUGH  --   --   --   --   --   --   --  13*  VANCOPEAK  --   --   --   --  9* 27*  --   --     Estimated Creatinine Clearance: 102.5 mL/min (by C-G formula based on SCr of 0.81 mg/dL).    Allergies  Allergen Reactions  . Penicillins Swelling and Rash    Royetta Asal, PharmD, BCPS 04/28/2019 3:37 AM

## 2019-04-28 NOTE — Progress Notes (Signed)
Called and spoke with patient's spouse Juliann Pulse and updated her on patient's care and status.  All questions answered.

## 2019-04-29 DIAGNOSIS — J1289 Other viral pneumonia: Secondary | ICD-10-CM

## 2019-04-29 DIAGNOSIS — J069 Acute upper respiratory infection, unspecified: Secondary | ICD-10-CM

## 2019-04-29 LAB — GLUCOSE, CAPILLARY
Glucose-Capillary: 127 mg/dL — ABNORMAL HIGH (ref 70–99)
Glucose-Capillary: 156 mg/dL — ABNORMAL HIGH (ref 70–99)
Glucose-Capillary: 146 mg/dL — ABNORMAL HIGH (ref 70–99)
Glucose-Capillary: 144 mg/dL — ABNORMAL HIGH (ref 70–99)
Glucose-Capillary: 178 mg/dL — ABNORMAL HIGH (ref 70–99)
Glucose-Capillary: 152 mg/dL — ABNORMAL HIGH (ref 70–99)

## 2019-04-29 LAB — POCT I-STAT 7, (LYTES, BLD GAS, ICA,H+H)
Acid-Base Excess: 10 mmol/L — ABNORMAL HIGH (ref 0.0–2.0)
Bicarbonate: 35 mmol/L — ABNORMAL HIGH (ref 20.0–28.0)
Calcium, Ion: 1.32 mmol/L (ref 1.15–1.40)
HCT: 30 % — ABNORMAL LOW (ref 39.0–52.0)
Hemoglobin: 10.2 g/dL — ABNORMAL LOW (ref 13.0–17.0)
O2 Saturation: 94 %
Patient temperature: 98.6
Potassium: 3.8 mmol/L (ref 3.5–5.1)
Sodium: 149 mmol/L — ABNORMAL HIGH (ref 135–145)
TCO2: 37 mmol/L — ABNORMAL HIGH (ref 22–32)
pCO2 arterial: 49.7 mmHg — ABNORMAL HIGH (ref 32.0–48.0)
pH, Arterial: 7.456 — ABNORMAL HIGH (ref 7.350–7.450)
pO2, Arterial: 70 mmHg — ABNORMAL LOW (ref 83.0–108.0)

## 2019-04-29 LAB — CBC WITH DIFFERENTIAL/PLATELET
Abs Immature Granulocytes: 1.53 10*3/uL — ABNORMAL HIGH (ref 0.00–0.07)
Basophils Absolute: 0 10*3/uL (ref 0.0–0.1)
Basophils Relative: 0 %
Eosinophils Absolute: 0 10*3/uL (ref 0.0–0.5)
Eosinophils Relative: 0 %
HCT: 31.9 % — ABNORMAL LOW (ref 39.0–52.0)
Hemoglobin: 10.3 g/dL — ABNORMAL LOW (ref 13.0–17.0)
Immature Granulocytes: 9 %
Lymphocytes Relative: 10 %
Lymphs Abs: 1.7 10*3/uL (ref 0.7–4.0)
MCH: 29.9 pg (ref 26.0–34.0)
MCHC: 32.3 g/dL (ref 30.0–36.0)
MCV: 92.7 fL (ref 80.0–100.0)
Monocytes Absolute: 1.5 10*3/uL — ABNORMAL HIGH (ref 0.1–1.0)
Monocytes Relative: 8 %
Neutro Abs: 12.6 10*3/uL — ABNORMAL HIGH (ref 1.7–7.7)
Neutrophils Relative %: 73 %
Platelets: 418 10*3/uL — ABNORMAL HIGH (ref 150–400)
RBC: 3.44 MIL/uL — ABNORMAL LOW (ref 4.22–5.81)
RDW: 15.7 % — ABNORMAL HIGH (ref 11.5–15.5)
WBC: 17.3 10*3/uL — ABNORMAL HIGH (ref 4.0–10.5)
nRBC: 0.2 % (ref 0.0–0.2)

## 2019-04-29 LAB — COMPREHENSIVE METABOLIC PANEL
ALT: 63 U/L — ABNORMAL HIGH (ref 0–44)
AST: 55 U/L — ABNORMAL HIGH (ref 15–41)
Albumin: 2.5 g/dL — ABNORMAL LOW (ref 3.5–5.0)
Alkaline Phosphatase: 56 U/L (ref 38–126)
Anion gap: 10 (ref 5–15)
BUN: 31 mg/dL — ABNORMAL HIGH (ref 8–23)
CO2: 33 mmol/L — ABNORMAL HIGH (ref 22–32)
Calcium: 9.7 mg/dL (ref 8.9–10.3)
Chloride: 107 mmol/L (ref 98–111)
Creatinine, Ser: 0.77 mg/dL (ref 0.61–1.24)
GFR calc Af Amer: >60 mL/min (ref >60)
GFR calc non Af Amer: >60 mL/min (ref >60)
Glucose, Bld: 147 mg/dL — ABNORMAL HIGH (ref 70–99)
Potassium: 3.8 mmol/L (ref 3.5–5.1)
Sodium: 150 mmol/L — ABNORMAL HIGH (ref 135–145)
Total Bilirubin: 0.4 mg/dL (ref 0.3–1.2)
Total Protein: 6.8 g/dL (ref 6.5–8.1)

## 2019-04-29 LAB — C-REACTIVE PROTEIN: CRP: 14.2 mg/dL — ABNORMAL HIGH (ref <1.0)

## 2019-04-29 LAB — PROTIME-INR
INR: 1.2 (ref 0.8–1.2)
Prothrombin Time: 14.8 seconds (ref 11.4–15.2)

## 2019-04-29 LAB — CULTURE, RESPIRATORY W GRAM STAIN: Culture: NORMAL

## 2019-04-29 LAB — MAGNESIUM: Magnesium: 2 mg/dL (ref 1.7–2.4)

## 2019-04-29 LAB — D-DIMER, QUANTITATIVE: D-Dimer, Quant: 1.44 ug/mL-FEU — ABNORMAL HIGH (ref 0.00–0.50)

## 2019-04-29 LAB — PHOSPHORUS: Phosphorus: 3.8 mg/dL (ref 2.5–4.6)

## 2019-04-29 LAB — FIBRINOGEN: Fibrinogen: >800 mg/dL — ABNORMAL HIGH (ref 210–475)

## 2019-04-29 MED ORDER — ZINC GLUCONATE 50 MG PO TABS: 50.0000 mg | ORAL_TABLET | Freq: Every day | ORAL | Status: DC

## 2019-04-29 MED ORDER — VITAMIN D 25 MCG (1000 UNIT) PO TABS
1000.0000 [IU] | ORAL_TABLET | Freq: Every day | ORAL | Status: DC
  Administered 2019-04-29 – 2019-05-03 (×5): 1000 [IU]
  Filled 2019-04-29 (×6): qty 1

## 2019-04-29 MED ORDER — FLUOXETINE HCL 20 MG/5ML PO SOLN
20.0000 mg | Freq: Every day | ORAL | Status: DC
  Administered 2019-04-29 – 2019-05-03 (×5): 20 mg
  Filled 2019-04-29 (×6): qty 5

## 2019-04-29 MED ORDER — ASPIRIN 81 MG PO CHEW
81.0000 mg | CHEWABLE_TABLET | Freq: Every day | ORAL | Status: DC
  Administered 2019-04-29 – 2019-05-03 (×5): 81 mg
  Filled 2019-04-29 (×5): qty 1

## 2019-04-29 MED ORDER — VITAMIN C 500 MG PO TABS
500.0000 mg | ORAL_TABLET | Freq: Every day | ORAL | Status: DC
  Administered 2019-04-29 – 2019-05-03 (×5): 500 mg
  Filled 2019-04-29 (×5): qty 1

## 2019-04-29 MED ORDER — FREE WATER
200.0000 mL | Freq: Four times a day (QID) | Status: DC
  Administered 2019-04-29 – 2019-05-01 (×8): 200 mL

## 2019-04-29 MED ORDER — IPRATROPIUM-ALBUTEROL 0.5-2.5 (3) MG/3ML IN SOLN: 3.0000 mL | RESPIRATORY_TRACT | Status: DC | PRN

## 2019-04-29 MED ORDER — TAMSULOSIN HCL 0.4 MG PO CAPS
0.4000 mg | ORAL_CAPSULE | Freq: Two times a day (BID) | ORAL | Status: DC
  Administered 2019-04-29 – 2019-05-08 (×19): 0.4 mg via ORAL
  Filled 2019-04-29 (×20): qty 1

## 2019-04-29 MED ORDER — ACETAMINOPHEN 325 MG PO TABS
650.0000 mg | ORAL_TABLET | Freq: Four times a day (QID) | ORAL | Status: DC | PRN
  Administered 2019-04-30 – 2019-05-03 (×3): 650 mg
  Filled 2019-04-29 (×3): qty 2

## 2019-04-29 MED ORDER — ROSUVASTATIN CALCIUM 20 MG PO TABS
20.0000 mg | ORAL_TABLET | Freq: Every day | ORAL | Status: DC
  Administered 2019-04-29 – 2019-05-01 (×3): 20 mg
  Filled 2019-04-29 (×4): qty 1

## 2019-04-29 MED ORDER — FUROSEMIDE 10 MG/ML IJ SOLN
40.0000 mg | Freq: Once | INTRAMUSCULAR | Status: AC
  Administered 2019-04-29: 40 mg via INTRAVENOUS
  Filled 2019-04-29: qty 4

## 2019-04-29 MED ORDER — ZINC SULFATE 220 (50 ZN) MG PO CAPS
220.0000 mg | ORAL_CAPSULE | Freq: Every day | ORAL | Status: DC
  Administered 2019-04-29 – 2019-05-03 (×5): 220 mg
  Filled 2019-04-29 (×5): qty 1

## 2019-04-29 NOTE — Progress Notes (Signed)
Pt's foley was removed on 10/18 @ 1916. Pt did not void on night shift and was In and out cathed by night shift RN. Pt was also started on 0.4mg  of flomax BID this AM. Dr Sherral Hammers and I agreed to hold off on placing foley and reassess around 1600. I in and out cathed pt at 1300 at got 65cc. Pt received lasix at 1530. Pt voided 350cc at 1630 via condom catheter. Notified Dr Sherral Hammers. Will not place foley at this time.

## 2019-04-29 NOTE — Progress Notes (Signed)
Pt's wife and daughter facetimed with him at this time.

## 2019-04-29 NOTE — Progress Notes (Signed)
NAME:  ANDREAUS TEZAK, MRN:  [id number], DOB:  02/02/1956, LOS: 5 ADMISSION DATE:  04/24/2019, CONSULTATION DATE:  10/14 REFERRING MD:  Alcario Drought, CHIEF COMPLAINT:  dyspnea   Brief History   63 yr old male brought to ER on 04/24/19 with dyspnea, cough, fever, hypoxia from COVID 19 PNA after testing positive for on 04/17/19.  Intubated in ED and admitted to [facility].   Past Medical History  BPH, Depression, HLD, HTN  Significant Hospital Events   10/14 Admit, start decadron and remdesivir 10/15 enroll in [place] PACT study; prone positioning; convalescent plasma; GPC in blood cx >> start vancomycin 10/16 d/c prone positioning 10/18 persistent fever >> start rocephin; Coag neg Staph in blood culture >> contaminate, d/c vancomcin  Consults:    Procedures:  10/14 ETT >   Significant Diagnostic Tests:    Micro Data:  10/14 SARS COV2 >> POSITIVE 10/14 Blood >> Coag neg Staph 10/17 Sputum 10/17 >>   COVID therapy:  10/14 remdesivir > 10/19 10/14 decadron >  10/15 convalescent plasma  Antibiotics:  Levaquin 10/14  Vancomycin 10/15 > 10/18 Rocephin 10/18 >>   Interim history/subjective:  No fever.  PaO2:FiO2 175.  Objective   Blood pressure 136/71, pulse 65, temperature 99.9 F (37.7 C), temperature source Axillary, resp. rate (!) 24, height 6' (1.829 m), weight 93.3 kg, SpO2 92 %.    Vent Mode: PRVC FiO2 (%):  [40 %] 40 % Set Rate:  [24 bmp] 24 bmp Vt Set:  [470 mL] 470 mL PEEP:  [10 [id number] cmH20] 10 cmH20 Plateau Pressure:  [21 cmH20-24 cmH20] 21 cmH20   Intake/Output Summary (Last 24 hours) at 04/29/2019 1403 Last data filed at 04/29/2019 1200 Gross per 24 hour  Intake 2982.14 ml  Output 3275 ml  Net -292.86 ml  breathing over vent at 28 Filed Weights   04/26/19 0500 04/27/19 0500 04/28/19 0500  Weight: 98.1 kg 93 kg 93.3 kg   I/O +140  Examination:  General - sedated Eyes - pupils reactive ENT - ETT in place Cardiac - regular rate/rhythm, no murmur  Chest - b/l rhonchi Abdomen - soft, non tender, + bowel sounds Extremities - 1+ edema Skin - no rashes Neuro - RASS -2   Resolved Hospital Problem list     Assessment & Plan:   Acute hypoxic respiratory failure with ARDS from COVID 19 pneumonia. - goal SpO2 88 to 95%, plateau pressure < 30 cm H2O, driving pressure < 15 cm H2O - f/u CXR - defer additional prone positioning at this time - day 6/10 of decadron - completed remdesivir - received convalescent plasma - negative fluid balance as able - prn BDs  Fever with increased respiratory secretions. - day 2 of rocephin pending sputum culture results  Hypernatremia from diuresis. - free water  - f/u BMET  Acute metabolic encephalopathy. Hx of depression. - RASS goal 0 to -1 - resume outpt prozac  Hx of HTN, HLD. - hold outpt norvasc, HCTZ, lisinopril - continue outpt ASA, crestor  Anticoagulation. - enrolled in [place] PACT study  Best practice:  Diet: tube feeds DVT prophylaxis: lovenox GI prophylaxis: protonix Mobility: bed rest Code Status: full Disposition: ICU  Labs    CMP Latest Ref Rng & Units 04/29/2019 04/29/2019 04/28/2019  Glucose 70 - 99 mg/dL 147(H) - -  BUN 8 - 23 mg/dL 31(H) - -  Creatinine 0.61 - 1.24 mg/dL 0.77 - -  Sodium 135 - 145 mmol/L 150(H) 149(H) 149(H)  Potassium 3.5 - 5.1 mmol/L  3.8 3.8 3.6  Chloride 98 - 111 mmol/L 107 - -  CO2 22 - 32 mmol/L 33(H) - -  Calcium 8.9 - 10.3 mg/dL 9.7 - -  Total Protein 6.5 - 8.1 g/dL 6.8 - -  Total Bilirubin 0.3 - 1.2 mg/dL 0.4 - -  Alkaline Phos 38 - 126 U/L 56 - -  AST 15 - 41 U/L 55(H) - -  ALT 0 - 44 U/L 63(H) - -   CBC Latest Ref Rng & Units 04/29/2019 04/29/2019 04/28/2019  WBC 4.0 - 10.5 K/uL 17.3(H) - -  Hemoglobin 13.0 - 17.0 g/dL 10.3(L) 10.2(L) 15.3  Hematocrit 39.0 - 52.0 % 31.9(L) 30.0(L) 45.0  Platelets 150 - 400 K/uL 418(H) - -   ABG    Component Value Date/Time   PHART 7.456 (H) 04/29/2019 0338   PCO2ART 49.7 (H)  04/29/2019 0338   PO2ART 70.0 (L) 04/29/2019 0338   HCO3 35.0 (H) 04/29/2019 0338   TCO2 37 (H) 04/29/2019 0338   ACIDBASEDEF 3.0 (H) 04/25/2019 0510   O2SAT 94.0 04/29/2019 0338   CBG (last 3)  Recent Labs    04/29/19 0442 04/29/19 0755 04/29/19 1129  GLUCAP 156* 146* 144*    D/w Dr. Sherral Hammers  CC time 9 minutes  Chesley Mires, MD [place] 04/29/2019, 2:32 PM

## 2019-04-29 NOTE — Progress Notes (Signed)
Attempted to call family at listed number.  No answer.  Chesley Mires, MD [facility] Pulmonary/Critical Care 04/29/2019, 2:52 PM

## 2019-04-29 NOTE — Progress Notes (Signed)
PROGRESS NOTE    Dennis Moore  [id number] DOB: March 02, 1956 DOA: 04/24/2019 PCP: Ann Held, DO   Brief Narrative:  63 y.o. BM PMHx depression, HTN.,  HLD, BPH   From PCP records I can see that the patient called 10/7 with fevers and cough onset 10/6.  He was tested for COVID on 10/8 and the test came back positive (RT PCR, result visible to [place] under Care Everywhere > Lab results [place] > Viral tests).  Patient presented to the ED with worsening SOB, respiratory distress.  Was satting 8s on RA  Per EDP note: "Has had progressively worsening cough, now with shortness of breath for the past 2 or 3 days. Denies chest pain. Intermittent fevers. No abdominal pain. No vomiting, no diarrhea. Symptoms constant, moderate in severity, no exacerbating or alleviating factors."  ED Course: Sats were still 89% on NRB, tachypnic with RR 30-40s.  CXR showing multifocal bilateral PNA c/w COVID  EDP made decision to intubate patient and patient was intubated.  Hospitalist has been called to admit patient over to [facility] and PCCM has been consulted.  Subjective: 10/19 intubated/sedated will wake up to stimuli.  Does not follow commands.   Assessment & Plan:   Principal Problem:   Acute respiratory distress syndrome (ARDS) due to COVID-19 virus [facility]) Active Problems:   Essential hypertension   Acute respiratory failure with hypoxia (HCC)   Depression   Acute on chronic respiratory failure with hypoxia (HCC)   VAP (ventilator-associated pneumonia) (HCC)  Acute respiratory failure with hypoxia/Covid 19 pneumonia -Remdesivir per pharmacy protocol -Decadron 6 mg daily -Spoke with Mrs. Estock and counseled her on the use of Covid convalescent plasma.  Informed not approving therapy for treatment of Covid 19 although there is some data showing benefit from its use.  She has agreed to sign consent form and is on her way to the hospital as we speak. -10/15  transfuse 1 unit Covid convalescent plasma; once paperwork signed -Patient with leukocytosis, possible bacterial infection hold on administration of Actemra -Prone patient for 16 hours/day, 8 hours off -Combivent QID -Adjust vent settings to remain within ARDS net guidelines -10/16 wean patient sedation medication as tolerated -10/18 continuing to wean patient daily as tolerated -Lasix PRN -10/19 Lasix IV 40 mg x 1  COVID-19 Labs  Recent Labs    04/27/19 0553 04/28/19 0548 04/29/19 0534  DDIMER 1.54* 1.89*  --   CRP 8.1* 15.2* 14.2*    Lab Results  Component Value Date   SARSCOV2NAA POSITIVE (A) 04/24/2019   [place] Not Detected 02/21/2019   ARDS -See Covid pneumonia  VAP Pneumonia? -Tracheal aspirate pending -Although procalcitonin and lactic acid negative patient with thick yellow sputum we will start empirically on ceftriaxone.  Essential HTN -Stable, currently not on pressors -Would start Levophed PRN to maintain MAP>65  Depression -Patient currently sedated and intubated not an issue     DVT prophylaxis: Lovenox--> Covid Pact trial Code Status: Full Family Communication: 10/18 Dr Marchelle Gearing will speak with family   Disposition Plan: TBD   Consultants:  PCCM     Procedures/Significant Events:  10/15 PCXR; worsening multifocal pneumonia     I have personally reviewed and interpreted all radiology studies and my findings are as above.  VENTILATOR SETTINGS: Vent mode; PRVC Set rate; 24 Vt Set; 470 FiO2; 40% PEEP; 10   Cultures 10/14 blood LEFT forearm positive coag negative staph (most likely contaminant) 10/14 SARS coronavirus positive 10/17 tracheal aspirate tracheal  aspirate positive cocci in pairs/GBR; reincubated     Antimicrobials: Anti-infectives (From admission, onward)   Start     Stop   04/28/19 1300  cefTRIAXone (ROCEPHIN) 2 g in sodium chloride 0.9 % 100 mL IVPB         04/28/19 1200  aztreonam (AZACTAM) 2 g in  sodium chloride 0.9 % 100 mL IVPB  Status:  Discontinued     04/28/19 1252   04/26/19 1200  vancomycin (VANCOCIN) 1,250 mg in sodium chloride 0.9 % 250 mL IVPB  Status:  Discontinued     04/28/19 1253   04/26/19 1000  remdesivir 100 mg in sodium chloride 0.9 % 250 mL IVPB     04/30/19 0959   04/25/19 2345  vancomycin (VANCOCIN) 2,000 mg in sodium chloride 0.9 % 500 mL IVPB     04/26/19 0148   04/25/19 0630  remdesivir 200 mg in sodium chloride 0.9 % 250 mL IVPB     04/25/19 0728   04/25/19 0000  levofloxacin (LEVAQUIN) IVPB 750 mg  Status:  Discontinued     04/25/19 1153       Devices    LINES / TUBES:      Continuous Infusions: . sodium chloride    . sodium chloride Stopped (04/28/19 1404)  . cefTRIAXone (ROCEPHIN)  IV Stopped (04/28/19 1434)  . feeding supplement (VITAL 1.5 CAL) 40 mL/hr at 04/28/19 1600  . fentaNYL infusion INTRAVENOUS 125 mcg/hr (04/29/19 0700)  . midazolam 2 mg/hr (04/29/19 0741)  . remdesivir 100 mg in NS 250 mL Stopped (04/28/19 0837)     Objective: Vitals:   04/29/19 0600 04/29/19 0630 04/29/19 0700 04/29/19 0747  BP: 129/67 128/65 134/68   Pulse: (!) 58 62 (!) 59   Resp: (!) 24 (!) 24 (!) 24   Temp:    99.9 F (37.7 C)  TempSrc:    Axillary  SpO2: 90% 94% 94%   Weight:      Height:        Intake/Output Summary (Last 24 hours) at 04/29/2019 0834 Last data filed at 04/29/2019 0700 Gross per 24 hour  Intake 2484.44 ml  Output 3525 ml  Net -1040.56 ml   Filed Weights   04/26/19 0500 04/27/19 0500 04/28/19 0500  Weight: 98.1 kg 93 kg 93.3 kg   Physical Exam:  General: Intubated/sedated, positive acute respiratory distress Eyes: negative scleral hemorrhage, negative anisocoria, negative icterus ENT: Negative Runny nose, negative gingival bleeding, #7.5 ETT tube in place, thick yellow sputum discharge from tube Neck:  Negative scars, masses, torticollis, lymphadenopathy, JVD Lungs: Tachypneic clear to auscultation bilaterally  without wheezes or crackles Cardiovascular: Bradycardic without murmur gallop or rub normal S1 and S2 Abdomen: negative abdominal pain, nondistended, positive soft, bowel sounds, no rebound, no ascites, no appreciable mass Extremities: No significant cyanosis, clubbing, or edema bilateral lower extremities Skin: Negative rashes, lesions, ulcers Psychiatric: Sedated/intubated Central nervous system: Sedated/intubated, however wakes up with minimal stimulation   Central nervous system: Withdraws to painful stimuli minimally        Data Reviewed: Care during the described time interval was provided by me .  I have reviewed this patient's available data, including medical history, events of note, physical examination, and all test results as part of my evaluation.   CBC: Recent Labs  Lab 04/25/19 0615 04/26/19 0555  04/27/19 0553 04/27/19 1233 04/28/19 0548 04/28/19 0809 04/29/19 0338 04/29/19 0534  WBC 12.0* 14.5*  --  14.3*  --  18.4*  --   --  17.3*  NEUTROABS 11.1* 12.8*  --  11.6*  --  14.4*  --   --  12.6*  HGB 10.9* 10.1*   < > 10.2* 11.9* 10.3* 15.3 10.2* 10.3*  HCT 33.0* 30.8*   < > 32.1* 35.0* 31.9* 45.0 30.0* 31.9*  MCV 89.2 92.2  --  93.0  --  93.3  --   --  92.7  PLT 331 340  --  341  --  415*  --   --  418*   < > = values in this interval not displayed.   Basic Metabolic Panel: Recent Labs  Lab 04/25/19 0615 04/25/19 1658 04/26/19 0555  04/27/19 0553 04/27/19 1233 04/28/19 0548 04/28/19 0809 04/29/19 0338 04/29/19 0534  NA 136  --  143   < > 150* 149* 151* 149* 149* 150*  K 3.9  --  4.0   < > 3.8 4.1 3.8 3.6 3.8 3.8  CL 101  --  109  --  110  --  111  --   --  107  CO2 22  --  25  --  29  --  30  --   --  33*  GLUCOSE 197*  --  170*  --  148*  --  122*  --   --  147*  BUN 26*  --  30*  --  30*  --  28*  --   --  31*  CREATININE 1.10  --  0.99  --  0.81  --  0.78  --   --  0.77  CALCIUM 8.1*  --  8.1*  --  8.8*  --  9.3  --   --  9.7  MG 2.1 2.2 2.4   --  2.0  --  2.0  --   --  2.0  PHOS 3.7 2.8 1.8*  --  2.1*  --  2.6  --   --  3.8   < > = values in this interval not displayed.   GFR: Estimated Creatinine Clearance: 112.2 mL/min (by C-G formula based on SCr of 0.77 mg/dL). Liver Function Tests: Recent Labs  Lab 04/25/19 0615 04/26/19 0555 04/27/19 0553 04/28/19 0548 04/29/19 0534  AST 63* 51* 54* 47* 55*  ALT 52* 43 46* 50* 63*  ALKPHOS 67 61 59 60 56  BILITOT 0.5 0.5 0.5 0.4 0.4  PROT 7.2 6.5 6.7 6.8 6.8  ALBUMIN 2.9* 2.7* 2.7* 2.6* 2.5*   No results for input(s): LIPASE, AMYLASE in the last 168 hours. No results for input(s): AMMONIA in the last 168 hours. Coagulation Profile: Recent Labs  Lab 04/25/19 1835 04/26/19 0555 04/27/19 0553 04/28/19 0548  INR 1.1 1.2 1.1 1.1   Cardiac Enzymes: No results for input(s): CKTOTAL, CKMB, CKMBINDEX, TROPONINI in the last 168 hours. BNP (last 3 results) No results for input(s): PROBNP in the last 8760 hours. HbA1C: No results for input(s): HGBA1C in the last 72 hours. CBG: Recent Labs  Lab 04/28/19 1620 04/28/19 1951 04/28/19 2328 04/29/19 0442 04/29/19 0755  GLUCAP 159* 148* 138* 156* 146*   Lipid Profile: No results for input(s): CHOL, HDL, LDLCALC, TRIG, CHOLHDL, LDLDIRECT in the last 72 hours. Thyroid Function Tests: No results for input(s): TSH, T4TOTAL, FREET4, T3FREE, THYROIDAB in the last 72 hours. Anemia Panel: No results for input(s): VITAMINB12, FOLATE, FERRITIN, TIBC, IRON, RETICCTPCT in the last 72 hours. Urine analysis:    Component Value Date/Time   COLORURINE YELLOW 01/05/2010 2000   APPEARANCEUR CLEAR 01/05/2010 2000  LABSPEC 1.027 01/05/2010 2000   PHURINE 8.5 (H) 01/05/2010 2000   GLUCOSEU NEGATIVE 01/05/2010 2000   HGBUR NEGATIVE 01/05/2010 2000   HGBUR negative 12/04/2008 0802   BILIRUBINUR neg 08/25/2015 1206   KETONESUR NEGATIVE 01/05/2010 2000   PROTEINUR neg 08/25/2015 1206   PROTEINUR 30 (A) 01/05/2010 2000   UROBILINOGEN 0.2  08/25/2015 1206   UROBILINOGEN 1.0 01/05/2010 2000   NITRITE neg 08/25/2015 1206   NITRITE NEGATIVE 01/05/2010 2000   LEUKOCYTESUR Negative 08/25/2015 1206   Sepsis Labs: @LABRCNTIP (procalcitonin:4,lacticidven:4)  ) Recent Results (from the past 240 hour(s))  SARS CORONAVIRUS 2 (TAT 6-24 HRS)     Status: Abnormal   Collection Time: 04/24/19  7:06 PM  Result Value Ref Range Status   SARS Coronavirus 2 POSITIVE (A) NEGATIVE Final    Comment: RESULT CALLED TO, READ BACK BY AND VERIFIED WITH: RN H BILLITER @ [phone] 04/25/19 BY S GEZAHEGN (NOTE) SARS-CoV-2 target nucleic acids are DETECTED. The SARS-CoV-2 RNA is generally detectable in upper and lower respiratory specimens during the acute phase of infection. Positive results are indicative of active infection with SARS-CoV-2. Clinical  correlation with patient history and other diagnostic information is necessary to determine patient infection status. Positive results do  not rule out bacterial infection or co-infection with other viruses. The expected result is Negative. Fact Sheet for Patients: SugarRoll.be Fact Sheet for Healthcare Providers: https://www.-mathews.com/ This test is not yet approved or cleared by the [place] FDA and  has been authorized for detection and/or diagnosis of SARS-CoV-2 by FDA under an Emergency Use Authorization (EUA). This EUA will remain  in effect (meaning this test can be use d) for the duration of the COVID-19 declaration under Section 564(b)(1) of the Act, 21 U.S.C. section 360bbb-3(b)(1), unless the authorization is terminated or revoked sooner. Performed at [facility], [place] [address] [place], [place] [address]   Culture, blood (single) w Reflex to ID Panel     Status: Abnormal   Collection Time: 04/24/19 10:00 PM   Specimen: BLOOD LEFT FOREARM  Result Value Ref Range Status   Specimen Description BLOOD LEFT FOREARM  Final    Special Requests   Final    BOTTLES DRAWN AEROBIC AND ANAEROBIC Blood Culture adequate volume   Culture  Setup Time   Final    IN BOTH AEROBIC AND ANAEROBIC BOTTLES GRAM POSITIVE COCCI CRITICAL RESULT CALLED TO, READ BACK BY AND VERIFIED WITH: N GLOGOVAC PHARMD 04/25/19 2230 JDW    Culture (A)  Final    STAPHYLOCOCCUS SPECIES (COAGULASE NEGATIVE) THE SIGNIFICANCE OF ISOLATING THIS ORGANISM FROM A SINGLE SET OF BLOOD CULTURES WHEN MULTIPLE SETS ARE DRAWN IS UNCERTAIN. PLEASE NOTIFY THE MICROBIOLOGY DEPARTMENT WITHIN ONE WEEK IF SPECIATION AND SENSITIVITIES ARE REQUIRED. MICROCOCCUS SPECIES Standardized susceptibility testing for this organism is not available. Performed at [facility], [place] [address] [place], [place] [address]    Report Status 04/27/2019 FINAL  Final  MRSA PCR Screening     Status: None   Collection Time: 04/26/19  1:00 PM   Specimen: Nasopharyngeal  Result Value Ref Range Status   MRSA by PCR NEGATIVE NEGATIVE Final    Comment:        The GeneXpert MRSA Assay (FDA approved for NASAL specimens only), is one component of a comprehensive MRSA colonization surveillance program. It is not intended to diagnose MRSA infection nor to guide or monitor treatment for MRSA infections. Performed at [facility], [place] [address] [place], [place]  [address]   Culture, respiratory (non-expectorated)     Status: None (Preliminary result)   Collection Time: 04/27/19 10:54 AM   Specimen: Tracheal Aspirate; Respiratory  Result Value Ref Range Status   Specimen Description   Final    TRACHEAL ASPIRATE Performed at [place] [address], [place], [place] [address]    Special Requests   Final    NONE Performed at [facility], [place] [address] [place], [place] [address]    Gram Stain   Final    FEW WBC PRESENT, PREDOMINANTLY PMN FEW GRAM POSITIVE COCCI IN PAIRS RARE GRAM POSITIVE RODS    Culture   Final     CULTURE REINCUBATED FOR BETTER GROWTH Performed at [facility], [place] [address] [place], [place] [address]    Report Status PENDING  Incomplete         Radiology Studies: Dg Chest Port 1 View  Result Date: 04/27/2019 CLINICAL DATA:  Follow-up coronavirus pneumonia. EXAM: PORTABLE CHEST 1 VIEW COMPARISON:  04/25/2019 FINDINGS: Endotracheal tube tip is 2 cm above the carina. Orogastric or nasogastric tube enters the abdomen. Widespread bilateral pulmonary infiltrates persist, similar to the previous study. No worsening or new finding. IMPRESSION: 1. Endotracheal tube 2 cm above the carina. 2. No change in bilateral pulmonary infiltrates. Electronically Signed   By: Nelson Chimes M.D.   On: 04/27/2019 10:21        Scheduled Meds: . sodium chloride   Intravenous Once  . chlorhexidine  15 mL Mouth/Throat BID  . Chlorhexidine Gluconate Cloth  6 each Topical Daily  . dexamethasone (DECADRON) injection  6 mg Intravenous Q24H  . enoxaparin (LOVENOX) injection  40 mg Subcutaneous Q24H  . etomidate  30 mg Intravenous Once  . feeding supplement (PRO-STAT SUGAR FREE 64)  60 mL Per Tube TID  . fentaNYL (SUBLIMAZE) injection  50 mcg Intravenous Once  . insulin aspart  0-9 Units Subcutaneous Q4H  . ipratropium-albuterol  3 mL Nebulization BID  . mouth rinse  15 mL Mouth Rinse 10 times per day  . pantoprazole sodium  40 mg Per Tube Daily  . polyethylene glycol  17 g Per Tube Daily  . succinylcholine  150 mg Intravenous Once   Continuous Infusions: . sodium chloride    . sodium chloride Stopped (04/28/19 1404)  . cefTRIAXone (ROCEPHIN)  IV Stopped (04/28/19 1434)  . feeding supplement (VITAL 1.5 CAL) 40 mL/hr at 04/28/19 1600  . fentaNYL infusion INTRAVENOUS 125 mcg/hr (04/29/19 0700)  . midazolam 2 mg/hr (04/29/19 0741)  . remdesivir 100 mg in NS 250 mL Stopped (04/28/19 0837)     LOS: 5 days   The patient is critically ill with multiple organ systems failure and requires high  complexity decision making for assessment and support, frequent evaluation and titration of therapies, application of advanced monitoring technologies and extensive interpretation of multiple databases. Critical Care Time devoted to patient care services described in this note  Time spent: 40 minutes     Jhanae Jaskowiak, Geraldo Docker, MD [facility] Pager [phone]  If 7PM-7AM, please contact night-coverage [email or web address] Password [facility] 04/29/2019, 8:34 AM

## 2019-04-29 NOTE — Progress Notes (Signed)
Paged Dr. Shanon Brow via Shea Evans to make her aware that patient has not voided since foley was removed last night at 1915.  Bladder scanned and reading only states > 200.  Asked MD for in and out catheter order. Waiting for call back.

## 2019-04-29 NOTE — Progress Notes (Signed)
Spoke with and updated pt's daughter on plan of care for the day. I was able to answer any of her questions and concerns also.

## 2019-04-29 NOTE — Progress Notes (Signed)
ANTICOAGULATION CONSULT NOTE - Initial Consult  Pharmacy Consult for Lovenox Indication: VTE prophylaxis (COVID-PACT trial - prophylaxis dose)   Allergies  Allergen Reactions  . Penicillins Swelling and Rash    Patient Measurements: Height: 6' (182.9 cm) Weight: 205 lb 11 oz (93.3 kg) IBW/kg (Calculated) : 77.6  Vital Signs: Temp: 99.9 F (37.7 C) (10/19 0747) Temp Source: Axillary (10/19 0747) BP: 137/74 (10/19 1139) Pulse Rate: 67 (10/19 1139)  Labs: Recent Labs    04/27/19 0553  04/28/19 0548 04/28/19 0809 04/29/19 0338 04/29/19 0534  HGB 10.2*   < > 10.3* 15.3 10.2* 10.3*  HCT 32.1*   < > 31.9* 45.0 30.0* 31.9*  PLT 341  --  415*  --   --  418*  LABPROT 14.2  --  14.5  --   --  14.8  INR 1.1  --  1.1  --   --  1.2  CREATININE 0.81  --  0.78  --   --  0.77   < > = values in this interval not displayed.    Estimated Creatinine Clearance: 112.2 mL/min (by C-G formula based on SCr of 0.77 mg/dL).   Medical History: Past Medical History:  Diagnosis Date  . Allergic rhinitis   . BPH (benign prostatic hypertrophy)   . Depression   . Erectile dysfunction   . Hyperlipidemia   . Hypertension   . Internal hemorrhoids   . Tubular adenoma of colon 11/2008    Medications:  Medications Prior to Admission  Medication Sig Dispense Refill Last Dose  . albuterol (PROVENTIL HFA;VENTOLIN HFA) 108 (90 Base) MCG/ACT inhaler Inhale 2 puffs into the lungs every 6 (six) hours as needed for wheezing. 54 Inhaler 3 04/24/2019 at Unknown time  . amLODipine (NORVASC) 5 MG tablet Take 5 mg by mouth daily.   04/23/2019  . aspirin EC 81 MG tablet Take 81 mg by mouth daily.   04/23/2019  . atorvastatin (LIPITOR) 80 MG tablet Take 80 mg by mouth daily.   04/23/2019  . benzonatate (TESSALON) 200 MG capsule Take 200 mg by mouth 3 (three) times daily as needed for cough.   04/23/2019  . cholecalciferol (VITAMIN D3) 25 MCG (1000 UT) tablet Take 1,000 Units by mouth daily.   04/23/2019  .  FLUoxetine (PROZAC) 20 MG capsule Take 20 mg by mouth daily.   04/23/2019  . lisinopril-hydrochlorothiazide (PRINZIDE,ZESTORETIC) 20-12.5 MG tablet Take 2 tablets by mouth daily. 60 tablet 11 04/23/2019  . omeprazole (PRILOSEC) 20 MG capsule Take 20 mg by mouth daily as needed for indigestion.   Past Week at Unknown time  . sildenafil (VIAGRA) 100 MG tablet Take 0.5 tablets (50 mg total) by mouth daily as needed for erectile dysfunction. 10 tablet 0 unk  . vitamin C (ASCORBIC ACID) 500 MG tablet Take 500 mg by mouth daily.   04/23/2019  . zinc gluconate 50 MG tablet Take 50 mg by mouth daily.   04/23/2019  . celecoxib (CELEBREX) 200 MG capsule Take 1 capsule (200 mg total) by mouth daily as needed. (Patient not taking: Reported on 04/25/2019) 30 capsule 11 Not Taking at Unknown time  . diazepam (VALIUM) 5 MG tablet Take 1 tablet (5 mg total) by mouth every 8 (eight) hours as needed for muscle spasms. (Patient not taking: Reported on 04/25/2019) 10 tablet 0 Not Taking at Unknown time  . fenofibrate 160 MG tablet Take 1 tablet (160 mg total) by mouth daily. (Patient not taking: Reported on 04/25/2019) 30 tablet 5 Not  Taking at Unknown time  . rosuvastatin (CRESTOR) 20 MG tablet Take 1 tablet (20 mg total) by mouth daily. 30 tablet 5     Assessment: 47 YOM admitted to the ICU with respiratory failure secondary to COVID-19 pneumonia. Pharmacy consulted to transition Lovenox to COVID-PACT regimen for VTE prophylaxis. Patient randomized to the prophylaxis dose arm.  Hgb low but stable at 10.3, platelets slightly elevated at 418. CrCl > 30 ml/min, BMI < 35. No overt bleeding noted.  Goal of Therapy:  VTE prophylaxis Monitor platelets by anticoagulation protocol: Yes   Plan:  -Continue Lovenox 40 mg SQ daily  -Monitor CBC and renal function   Richardine Service, PharmD PGY1 Pharmacy Resident 04/29/2019  12:46 PM

## 2019-04-29 NOTE — Progress Notes (Signed)
Spoke with Kathy(spouse) and gave updates on pt.  All questions answered at this time.

## 2019-04-30 ENCOUNTER — Inpatient Hospital Stay (HOSPITAL_COMMUNITY): Payer: 59

## 2019-04-30 LAB — COMPREHENSIVE METABOLIC PANEL
ALT: 132 U/L — ABNORMAL HIGH (ref 0–44)
AST: 118 U/L — ABNORMAL HIGH (ref 15–41)
Albumin: 2.6 g/dL — ABNORMAL LOW (ref 3.5–5.0)
Alkaline Phosphatase: 64 U/L (ref 38–126)
Anion gap: 13 (ref 5–15)
BUN: 36 mg/dL — ABNORMAL HIGH (ref 8–23)
CO2: 33 mmol/L — ABNORMAL HIGH (ref 22–32)
Calcium: 9.5 mg/dL (ref 8.9–10.3)
Chloride: 103 mmol/L (ref 98–111)
Creatinine, Ser: 0.82 mg/dL (ref 0.61–1.24)
GFR calc Af Amer: >60 mL/min (ref >60)
GFR calc non Af Amer: >60 mL/min (ref >60)
Glucose, Bld: 146 mg/dL — ABNORMAL HIGH (ref 70–99)
Potassium: 4.2 mmol/L (ref 3.5–5.1)
Sodium: 149 mmol/L — ABNORMAL HIGH (ref 135–145)
Total Bilirubin: 0.3 mg/dL (ref 0.3–1.2)
Total Protein: 7.1 g/dL (ref 6.5–8.1)

## 2019-04-30 LAB — PROTIME-INR
INR: 1.2 (ref 0.8–1.2)
Prothrombin Time: 14.7 seconds (ref 11.4–15.2)

## 2019-04-30 LAB — POCT I-STAT 7, (LYTES, BLD GAS, ICA,H+H)
Acid-Base Excess: 10 mmol/L — ABNORMAL HIGH (ref 0.0–2.0)
Bicarbonate: 35.5 mmol/L — ABNORMAL HIGH (ref 20.0–28.0)
Calcium, Ion: 1.3 mmol/L (ref 1.15–1.40)
HCT: 32 % — ABNORMAL LOW (ref 39.0–52.0)
Hemoglobin: 10.9 g/dL — ABNORMAL LOW (ref 13.0–17.0)
O2 Saturation: 94 %
Patient temperature: 41
Potassium: 3.8 mmol/L (ref 3.5–5.1)
Sodium: 147 mmol/L — ABNORMAL HIGH (ref 135–145)
TCO2: 37 mmol/L — ABNORMAL HIGH (ref 22–32)
pCO2 arterial: 59.4 mmHg — ABNORMAL HIGH (ref 32.0–48.0)
pH, Arterial: 7.4 (ref 7.350–7.450)
pO2, Arterial: 92 mmHg (ref 83.0–108.0)

## 2019-04-30 LAB — CBC WITH DIFFERENTIAL/PLATELET
Abs Immature Granulocytes: 1.49 10*3/uL — ABNORMAL HIGH (ref 0.00–0.07)
Basophils Absolute: 0.1 10*3/uL (ref 0.0–0.1)
Basophils Relative: 1 %
Eosinophils Absolute: 0 10*3/uL (ref 0.0–0.5)
Eosinophils Relative: 0 %
HCT: 33.1 % — ABNORMAL LOW (ref 39.0–52.0)
Hemoglobin: 10.7 g/dL — ABNORMAL LOW (ref 13.0–17.0)
Immature Granulocytes: 10 %
Lymphocytes Relative: 12 %
Lymphs Abs: 1.8 10*3/uL (ref 0.7–4.0)
MCH: 30 pg (ref 26.0–34.0)
MCHC: 32.3 g/dL (ref 30.0–36.0)
MCV: 92.7 fL (ref 80.0–100.0)
Monocytes Absolute: 1.5 10*3/uL — ABNORMAL HIGH (ref 0.1–1.0)
Monocytes Relative: 9 %
Neutro Abs: 10.6 10*3/uL — ABNORMAL HIGH (ref 1.7–7.7)
Neutrophils Relative %: 68 %
Platelets: 474 10*3/uL — ABNORMAL HIGH (ref 150–400)
RBC: 3.57 MIL/uL — ABNORMAL LOW (ref 4.22–5.81)
RDW: 15.4 % (ref 11.5–15.5)
WBC: 15.6 10*3/uL — ABNORMAL HIGH (ref 4.0–10.5)
nRBC: 0.4 % — ABNORMAL HIGH (ref 0.0–0.2)

## 2019-04-30 LAB — GLUCOSE, CAPILLARY
Glucose-Capillary: 136 mg/dL — ABNORMAL HIGH (ref 70–99)
Glucose-Capillary: 144 mg/dL — ABNORMAL HIGH (ref 70–99)
Glucose-Capillary: 145 mg/dL — ABNORMAL HIGH (ref 70–99)
Glucose-Capillary: 154 mg/dL — ABNORMAL HIGH (ref 70–99)
Glucose-Capillary: 156 mg/dL — ABNORMAL HIGH (ref 70–99)
Glucose-Capillary: 166 mg/dL — ABNORMAL HIGH (ref 70–99)

## 2019-04-30 LAB — PHOSPHORUS: Phosphorus: 5.3 mg/dL — ABNORMAL HIGH (ref 2.5–4.6)

## 2019-04-30 LAB — MAGNESIUM: Magnesium: 2.2 mg/dL (ref 1.7–2.4)

## 2019-04-30 MED ORDER — CLONAZEPAM 0.5 MG PO TBDP
1.0000 mg | ORAL_TABLET | Freq: Two times a day (BID) | ORAL | Status: DC
  Administered 2019-04-30 – 2019-05-02 (×5): 1 mg
  Filled 2019-04-30 (×5): qty 2

## 2019-04-30 MED ORDER — CLONAZEPAM 0.1 MG/ML ORAL SUSPENSION: 1.0000 mg | Freq: Two times a day (BID) | ORAL | Status: DC

## 2019-04-30 MED ORDER — QUETIAPINE FUMARATE 50 MG PO TABS
50.0000 mg | ORAL_TABLET | Freq: Every day | ORAL | Status: DC
  Administered 2019-04-30 – 2019-05-02 (×3): 50 mg
  Filled 2019-04-30 (×3): qty 1

## 2019-04-30 NOTE — Progress Notes (Signed)
PROGRESS NOTE    Dennis Moore  [id number] DOB: Mar 13, 1956 DOA: 04/24/2019 PCP: Ann Held, DO   Brief Narrative:  63 y.o. BM PMHx depression, HTN.,  HLD, BPH   From PCP records I can see that the patient called 10/7 with fevers and cough onset 10/6.  He was tested for COVID on 10/8 and the test came back positive (RT PCR, result visible to [place] under Care Everywhere > Lab results [facility] > Viral tests).  Patient presented to the ED with worsening SOB, respiratory distress.  Was satting 64s on RA  Per EDP note: "Has had progressively worsening cough, now with shortness of breath for the past 2 or 3 days. Denies chest pain. Intermittent fevers. No abdominal pain. No vomiting, no diarrhea. Symptoms constant, moderate in severity, no exacerbating or alleviating factors."  ED Course: Sats were still 89% on NRB, tachypnic with RR 30-40s.  CXR showing multifocal bilateral PNA c/w COVID  EDP made decision to intubate patient and patient was intubated.  Hospitalist has been called to admit patient over to [facility] and PCCM has been consulted.  Subjective: 10/20 intubated/minimally sedated wakes up with minimal stimuli, does not follow commands.  At times becomes wild--> tearing out lines.    Assessment & Plan:   Principal Problem:   Acute respiratory distress syndrome (ARDS) due to COVID-19 virus [facility]) Active Problems:   Essential hypertension   Acute respiratory failure with hypoxia (HCC)   Depression   Acute on chronic respiratory failure with hypoxia (HCC)   VAP (ventilator-associated pneumonia) (HCC)  Acute respiratory failure with hypoxia/Covid 19 pneumonia -Remdesivir per pharmacy protocol -Decadron 6 mg daily -Spoke with Mrs. Dibartolo and counseled her on the use of Covid convalescent plasma.  Informed not approving therapy for treatment of Covid 19 although there is some data showing benefit from its use.  She has agreed to sign consent form  and is on her way to the hospital as we speak. -10/15 transfuse 1 unit Covid convalescent plasma; once paperwork signed -Patient with leukocytosis, possible bacterial infection hold on administration of Actemra -Prone patient for 16 hours/day, 8 hours off -Combivent QID -Adjust vent settings to remain within ARDS net guidelines -Lasix PRN -10/19 Lasix IV 40 mg x 1 -10/20 wean as tolerated -10/20 start Klonopin 1 mg BID -10/20 start Seroquel 50 mg daily -If can wake patient up in a calm manner should be able to extubate.  COVID-19 Labs  Recent Labs    04/28/19 0548 04/29/19 0534  DDIMER 1.89* 1.44*  CRP 15.2* 14.2*    Lab Results  Component Value Date   SARSCOV2NAA POSITIVE (A) 04/24/2019   Dennis Moore Not Detected 02/21/2019   ARDS -See Covid pneumonia  VAP Pneumonia? -Tracheal aspirate; normal flora.  Discontinue ceftriaxone  Essential HTN -Stable, currently not on pressors -Would start Levophed PRN to maintain MAP>65  Depression -Patient currently sedated and intubated not an issue     DVT prophylaxis: Lovenox--> Covid Pact trial Code Status: Full Family Communication: 10/20 Dr Chesley Mires will speak with family   Disposition Plan: TBD   Consultants:  PCCM     Procedures/Significant Events:  10/15 PCXR; worsening multifocal pneumonia     I have personally reviewed and interpreted all radiology studies and my findings are as above.  VENTILATOR SETTINGS: Vent mode; PRVC Set rate; 24 Vt Set; 470 FiO2; 40% PEEP; 10   Cultures 10/14 blood LEFT forearm positive coag negative staph (most likely contaminant) 10/14 SARS coronavirus  positive 10/17 tracheal aspirate tracheal aspirate positive cocci in pairs/GBR; reincubated     Antimicrobials: Anti-infectives (From admission, onward)   Start     Stop   04/28/19 1300  cefTRIAXone (ROCEPHIN) 2 g in sodium chloride 0.9 % 100 mL IVPB  Status:  Discontinued     04/30/19 1002   04/28/19 1200   aztreonam (AZACTAM) 2 g in sodium chloride 0.9 % 100 mL IVPB  Status:  Discontinued     04/28/19 1252   04/26/19 1200  vancomycin (VANCOCIN) 1,250 mg in sodium chloride 0.9 % 250 mL IVPB  Status:  Discontinued     04/28/19 1253   04/26/19 1000  remdesivir 100 mg in sodium chloride 0.9 % 250 mL IVPB     04/29/19 1012   04/25/19 2345  vancomycin (VANCOCIN) 2,000 mg in sodium chloride 0.9 % 500 mL IVPB     04/26/19 0148   04/25/19 0630  remdesivir 200 mg in sodium chloride 0.9 % 250 mL IVPB     04/25/19 0728   04/25/19 0000  levofloxacin (LEVAQUIN) IVPB 750 mg  Status:  Discontinued     04/25/19 1153       Devices    LINES / TUBES:      Continuous Infusions: . sodium chloride Stopped (04/28/19 1404)  . cefTRIAXone (ROCEPHIN)  IV Stopped (04/29/19 1236)  . feeding supplement (VITAL 1.5 CAL) 1,000 mL (04/29/19 1228)  . fentaNYL infusion INTRAVENOUS 150 mcg/hr (04/30/19 0115)  . midazolam 6 mg/hr (04/30/19 0306)     Objective: Vitals:   04/30/19 0441 04/30/19 0500 04/30/19 0600 04/30/19 0700  BP: 124/68 130/74 134/74 126/65  Pulse: (!) 58 60 (!) 57 (!) 59  Resp: (!) 24 (!) 24 (!) 24 (!) 24  Temp:      TempSrc:      SpO2: 93% 91% 92% 93%  Weight:      Height:        Intake/Output Summary (Last 24 hours) at 04/30/2019 0729 Last data filed at 04/30/2019 0630 Gross per 24 hour  Intake 2643.46 ml  Output 3155 ml  Net -511.54 ml   Filed Weights   04/26/19 0500 04/27/19 0500 04/28/19 0500  Weight: 98.1 kg 93 kg 93.3 kg   Physical Exam:  General: Intubated/sedated, responds to painful stimuli, positive acute respiratory distress Eyes: negative scleral hemorrhage, negative anisocoria, negative icterus ENT: Negative Runny nose, negative gingival bleeding, #7.5 ET tube in place, no further secretions Neck:  Negative scars, masses, torticollis, lymphadenopathy, JVD Lungs: Tachypneic clear to auscultation bilaterally without wheezes or crackles Cardiovascular: Bradycardic  without murmur gallop or rub normal S1 and S2 Abdomen: negative abdominal pain, nondistended, positive soft, bowel sounds, no rebound, no ascites, no appreciable mass Extremities: No significant cyanosis, clubbing, or edema bilateral lower extremities Skin: Negative rashes, lesions, ulcers Psychiatric: Intubated/sedated Central nervous system: Intubated/sedated, however response to painful stimuli (will try to raise up out of the bed)         Data Reviewed: Care during the described time interval was provided by me .  I have reviewed this patient's available data, including medical history, events of note, physical examination, and all test results as part of my evaluation.   CBC: Recent Labs  Lab 04/25/19 0615 04/26/19 0555  04/27/19 0553  04/28/19 0548 04/28/19 0809 04/29/19 0338 04/29/19 0534 04/30/19 0451  WBC 12.0* 14.5*  --  14.3*  --  18.4*  --   --  17.3*  --   NEUTROABS 11.1* 12.8*  --  11.6*  --  14.4*  --   --  12.6*  --   HGB 10.9* 10.1*   < > 10.2*   < > 10.3* 15.3 10.2* 10.3* 10.9*  HCT 33.0* 30.8*   < > 32.1*   < > 31.9* 45.0 30.0* 31.9* 32.0*  MCV 89.2 92.2  --  93.0  --  93.3  --   --  92.7  --   PLT 331 340  --  341  --  415*  --   --  418*  --    < > = values in this interval not displayed.   Basic Metabolic Panel: Recent Labs  Lab 04/26/19 0555  04/27/19 0553  04/28/19 0548 04/28/19 0809 04/29/19 0338 04/29/19 0534 04/30/19 0451 04/30/19 0515  NA 143   < > 150*   < > 151* 149* 149* 150* 147* 149*  K 4.0   < > 3.8   < > 3.8 3.6 3.8 3.8 3.8 4.2  CL 109  --  110  --  111  --   --  107  --  103  CO2 25  --  29  --  30  --   --  33*  --  33*  GLUCOSE 170*  --  148*  --  122*  --   --  147*  --  146*  BUN 30*  --  30*  --  28*  --   --  31*  --  36*  CREATININE 0.99  --  0.81  --  0.78  --   --  0.77  --  0.82  CALCIUM 8.1*  --  8.8*  --  9.3  --   --  9.7  --  9.5  MG 2.4  --  2.0  --  2.0  --   --  2.0  --  2.2  PHOS 1.8*  --  2.1*  --  2.6  --    --  3.8  --  5.3*   < > = values in this interval not displayed.   GFR: Estimated Creatinine Clearance: 109.4 mL/min (by C-G formula based on SCr of 0.82 mg/dL). Liver Function Tests: Recent Labs  Lab 04/26/19 0555 04/27/19 0553 04/28/19 0548 04/29/19 0534 04/30/19 0515  AST 51* 54* 47* 55* 118*  ALT 43 46* 50* 63* 132*  ALKPHOS 61 59 60 56 64  BILITOT 0.5 0.5 0.4 0.4 0.3  PROT 6.5 6.7 6.8 6.8 7.1  ALBUMIN 2.7* 2.7* 2.6* 2.5* 2.6*   No results for input(s): LIPASE, AMYLASE in the last 168 hours. No results for input(s): AMMONIA in the last 168 hours. Coagulation Profile: Recent Labs  Lab 04/26/19 0555 04/27/19 0553 04/28/19 0548 04/29/19 0534 04/30/19 0515  INR 1.2 1.1 1.1 1.2 1.2   Cardiac Enzymes: No results for input(s): CKTOTAL, CKMB, CKMBINDEX, TROPONINI in the last 168 hours. BNP (last 3 results) No results for input(s): PROBNP in the last 8760 hours. HbA1C: No results for input(s): HGBA1C in the last 72 hours. CBG: Recent Labs  Lab 04/29/19 1129 04/29/19 1510 04/29/19 2009 04/29/19 2351 04/30/19 0356  GLUCAP 144* 178* 152* 166* 156*   Lipid Profile: No results for input(s): CHOL, HDL, LDLCALC, TRIG, CHOLHDL, LDLDIRECT in the last 72 hours. Thyroid Function Tests: No results for input(s): TSH, T4TOTAL, FREET4, T3FREE, THYROIDAB in the last 72 hours. Anemia Panel: No results for input(s): VITAMINB12, FOLATE, FERRITIN, TIBC, IRON, RETICCTPCT in the last 72 hours. Urine analysis:  Component Value Date/Time   COLORURINE YELLOW 01/05/2010 2000   APPEARANCEUR CLEAR 01/05/2010 2000   LABSPEC 1.027 01/05/2010 2000   PHURINE 8.5 (H) 01/05/2010 2000   GLUCOSEU NEGATIVE 01/05/2010 2000   HGBUR NEGATIVE 01/05/2010 2000   HGBUR negative 12/04/2008 0802   BILIRUBINUR neg 08/25/2015 1206   KETONESUR NEGATIVE 01/05/2010 2000   PROTEINUR neg 08/25/2015 1206   PROTEINUR 30 (A) 01/05/2010 2000   UROBILINOGEN 0.2 08/25/2015 1206   UROBILINOGEN 1.0 01/05/2010  2000   NITRITE neg 08/25/2015 1206   NITRITE NEGATIVE 01/05/2010 2000   LEUKOCYTESUR Negative 08/25/2015 1206   Sepsis Labs: @LABRCNTIP (procalcitonin:4,lacticidven:4)  ) Recent Results (from the past 240 hour(s))  SARS CORONAVIRUS 2 (TAT 6-24 HRS)     Status: Abnormal   Collection Time: 04/24/19  7:06 PM  Result Value Ref Range Status   SARS Coronavirus 2 POSITIVE (A) NEGATIVE Final    Comment: RESULT CALLED TO, READ BACK BY AND VERIFIED WITH: RN H BILLITER @ [phone] 04/25/19 BY S GEZAHEGN (NOTE) SARS-CoV-2 target nucleic acids are DETECTED. The SARS-CoV-2 RNA is generally detectable in upper and lower respiratory specimens during the acute phase of infection. Positive results are indicative of active infection with SARS-CoV-2. Clinical  correlation with patient history and other diagnostic information is necessary to determine patient infection status. Positive results do  not rule out bacterial infection or co-infection with other viruses. The expected result is Negative. Fact Sheet for Patients: SugarRoll.be Fact Sheet for Healthcare Providers: https://www.-mathews.com/ This test is not yet approved or cleared by the [place] FDA and  has been authorized for detection and/or diagnosis of SARS-CoV-2 by FDA under an Emergency Use Authorization (EUA). This EUA will remain  in effect (meaning this test can be use d) for the duration of the COVID-19 declaration under Section 564(b)(1) of the Act, 21 U.S.C. section 360bbb-3(b)(1), unless the authorization is terminated or revoked sooner. Performed at [facility], [place] [address] [place], [place] [address]   Culture, blood (single) w Reflex to ID Panel     Status: Abnormal   Collection Time: 04/24/19 10:00 PM   Specimen: BLOOD LEFT FOREARM  Result Value Ref Range Status   Specimen Description BLOOD LEFT FOREARM  Final   Special Requests   Final    BOTTLES DRAWN AEROBIC  AND ANAEROBIC Blood Culture adequate volume   Culture  Setup Time   Final    IN BOTH AEROBIC AND ANAEROBIC BOTTLES GRAM POSITIVE COCCI CRITICAL RESULT CALLED TO, READ BACK BY AND VERIFIED WITH: N GLOGOVAC PHARMD 04/25/19 2230 JDW    Culture (A)  Final    STAPHYLOCOCCUS SPECIES (COAGULASE NEGATIVE) THE SIGNIFICANCE OF ISOLATING THIS ORGANISM FROM A SINGLE SET OF BLOOD CULTURES WHEN MULTIPLE SETS ARE DRAWN IS UNCERTAIN. PLEASE NOTIFY THE MICROBIOLOGY DEPARTMENT WITHIN ONE WEEK IF SPECIATION AND SENSITIVITIES ARE REQUIRED. MICROCOCCUS SPECIES Standardized susceptibility testing for this organism is not available. Performed at [facility], [place] [address], [place], [place] [address]    Report Status 04/27/2019 FINAL  Final  MRSA PCR Screening     Status: None   Collection Time: 04/26/19  1:00 PM   Specimen: Nasopharyngeal  Result Value Ref Range Status   MRSA by PCR NEGATIVE NEGATIVE Final    Comment:        The GeneXpert MRSA Assay (FDA approved for NASAL specimens only), is one component of a comprehensive MRSA colonization surveillance program. It is not intended to diagnose MRSA infection nor to guide or  monitor treatment for MRSA infections. Performed at [facility], [place] [address], [place], [place] [address]   Culture, respiratory (non-expectorated)     Status: None   Collection Time: 04/27/19 10:54 AM   Specimen: Tracheal Aspirate; Respiratory  Result Value Ref Range Status   Specimen Description   Final    TRACHEAL ASPIRATE Performed at [facility] [address] [place], [place] [address]    Special Requests   Final    NONE Performed at [facility], [place] [address] [place], [place] [address]    Gram Stain   Final    FEW WBC PRESENT, PREDOMINANTLY PMN FEW GRAM POSITIVE COCCI IN PAIRS RARE GRAM POSITIVE RODS    Culture   Final    Consistent with normal respiratory flora. Performed at [facility], [place] [address], [place], [place] [address]    Report Status 04/29/2019 FINAL  Final         Radiology Studies: Dg Chest Port 1 View  Result Date: 04/30/2019 CLINICAL DATA:  Respiratory failure. EXAM: PORTABLE CHEST 1 VIEW COMPARISON:  Chest x-ray 04/27/2019. FINDINGS: Endotracheal tube and NG tube in stable position. Stable cardiomegaly. Diffuse bilateral pulmonary infiltrates/edema again noted. Slight improvement from prior exam. Progressive left base atelectasis. Left pleural effusion cannot be excluded. IMPRESSION: 1.  Endotracheal tube and NG tube in stable position. 2.  Stable cardiomegaly. 3. Diffuse bilateral pulmonary infiltrates/edema again noted. Slight improvement from prior exam. Progressive left base atelectasis. Left pleural effusion cannot be excluded. Electronically Signed   By: [place]   On: 04/30/2019 07:24        Scheduled Meds: . aspirin  81 mg Per Tube Daily  . chlorhexidine  15 mL Mouth/Throat BID  . Chlorhexidine Gluconate Cloth  6 each Topical Daily  . cholecalciferol  1,000 Units Per Tube Daily  . dexamethasone (DECADRON) injection  6 mg Intravenous Q24H  . enoxaparin (LOVENOX) injection  40 mg Subcutaneous Q24H  . feeding supplement (PRO-STAT SUGAR FREE 64)  60 mL Per Tube TID  . FLUoxetine  20 mg Per Tube Daily  . free water  200 mL Per Tube Q6H  . insulin aspart  0-9 Units Subcutaneous Q4H  . mouth rinse  15 mL Mouth Rinse 10 times per day  . pantoprazole sodium  40 mg Per Tube Daily  . polyethylene glycol  17 g Per Tube Daily  . rosuvastatin  20 mg Per Tube Daily  . tamsulosin  0.4 mg Oral BID  . vitamin C  500 mg Per Tube Daily  . zinc sulfate  220 mg Per Tube Daily   Continuous Infusions: . sodium chloride Stopped (04/28/19 1404)  . cefTRIAXone (ROCEPHIN)  IV Stopped (04/29/19 1236)  . feeding supplement (VITAL 1.5 CAL) 1,000 mL (04/29/19 1228)  . fentaNYL infusion INTRAVENOUS 150 mcg/hr (04/30/19 0115)  . midazolam 6 mg/hr  (04/30/19 0306)     LOS: 6 days   The patient is critically ill with multiple organ systems failure and requires high complexity decision making for assessment and support, frequent evaluation and titration of therapies, application of advanced monitoring technologies and extensive interpretation of multiple databases. Critical Care Time devoted to patient care services described in this note  Time spent: 40 minutes     Kaye Luoma, Geraldo Docker, MD [facility] Pager [phone]  If 7PM-7AM, please contact night-coverage [email or web address] Password [email or web address] 04/30/2019, 7:29 AM

## 2019-04-30 NOTE — Progress Notes (Signed)
Called [employer] and set up face time call at 4:00 with patient's wife and family.

## 2019-04-30 NOTE — Progress Notes (Signed)
Spoke to patient's wife, Juliann Pulse and gave her updates on patient care and condition. Answered all questions she had regarding patient's treatment and condition. She stated that she and other family members would like to face time with patient. Told her I would call [employer] and set up call.

## 2019-04-30 NOTE — Progress Notes (Signed)
Assisted tele visit to patient with mother.  Tyion Boylen Anderson, RN   

## 2019-04-30 NOTE — Progress Notes (Signed)
Spoke with pt's wife over the phone.  Updated about current status and treatment plan.  Chesley Mires, MD [facility] Pulmonary/Critical Care 04/30/2019, 1:09 PM

## 2019-04-30 NOTE — Progress Notes (Signed)
NAME:  Dennis Moore, MRN:  [id number], DOB:  10-26-1955, LOS: 6 ADMISSION DATE:  04/24/2019, CONSULTATION DATE:  10/14 REFERRING MD:  Alcario Drought, CHIEF COMPLAINT:  dyspnea   Brief History   63 yr old male brought to ER on 04/24/19 with dyspnea, cough, fever, hypoxia from COVID 19 PNA after testing positive for on 04/17/19.  Intubated in ED and admitted to [facility].   Past Medical History  BPH, Depression, HLD, HTN  Significant Hospital Events   10/14 Admit, start decadron and remdesivir 10/15 enroll in [place] PACT study; prone positioning; convalescent plasma; GPC in blood cx >> start vancomycin 10/16 d/c prone positioning 10/18 persistent fever >> start rocephin; Coag neg Staph in blood culture >> contaminate, d/c vancomcin  Consults:    Procedures:  10/14 ETT >   Significant Diagnostic Tests:    Micro Data:  10/14 SARS COV2 >> POSITIVE 10/14 Blood >> Coag neg Staph 10/17 Sputum 10/17 >> oral flora  COVID therapy:  10/14 remdesivir > 10/19 10/14 decadron >  10/15 convalescent plasma  Antibiotics:  Levaquin 10/14  Vancomycin 10/15 > 10/18 Rocephin 10/18 >>   Interim history/subjective:  PaO2:FiO2 148.  On 40% FiO2.  Deeply sedated >> no respiratory effort with SBT.  Overnight required more sedation due to severe agitation.  Objective   Blood pressure 119/70, pulse (!) 55, temperature 99.3 F (37.4 C), temperature source Oral, resp. rate (!) 24, height 6' (1.829 m), weight 93.3 kg, SpO2 93 %.    Vent Mode: PRVC FiO2 (%):  [40 %] 40 % Set Rate:  [24 bmp] 24 bmp Vt Set:  [470 mL] 470 mL PEEP:  [8 cmH20-10 cmH20] 8 cmH20 Plateau Pressure:  [20 cmH20-21 cmH20] 20 cmH20   Intake/Output Summary (Last 24 hours) at 04/30/2019 0910 Last data filed at 04/30/2019 0630 Gross per 24 hour  Intake 2533.01 ml  Output 3155 ml  Net -621.99 ml   Filed Weights   04/26/19 0500 04/27/19 0500 04/28/19 0500  Weight: 98.1 kg 93 kg 93.3 kg    Examination:  General - sedated  Eyes - pupils reactive ENT - ETT in place Cardiac - regular rate/rhythm, no murmur Chest - b/l rhonchi Abdomen - soft, non tender, + bowel sounds Extremities - 1+ edema Skin - no rashes Neuro - RASS -2  CXR (reviewed by me) - b/l ASD with some improvement in aeration   Resolved Hospital Problem list     Assessment & Plan:   Acute hypoxic, hypercapnic respiratory failure with ARDS from COVID 19 pneumonia. - goal SpO2 88 to 95%, plateau pressure < 30 cm H2O, driving pressure < 15 cm H2O - mental status seems to be main barrier to weaning >> try to limit sedation and start pressure support trials - day 7/10 of decadron - f/u CXR intermittently - completed remdesivir - received convalescent plasma - prn BDs - continue zinc, vit C  Fever with increased respiratory secretions. - fever curve better, and sputum culture negative - might be able to d/c Abx soon >> defer to hospitalist team  Hypernatremia from diuresis. - continue free water - f/u BMET - hold lasix for now  Acute metabolic encephalopathy. Hx of depression. - RASS goal 0 to -1 - continue prozac - add scheduled klonopin bid, and seroquel qhs - try to wean off versed and then fentanyl gtt - defer use of precedex in setting of sinus bradycardia  Hx of HTN, HLD. - hold outpt norvasc, HCTZ, lisinopril - continue outpt ASA, crestor  Anticoagulation. - enrolled in [place] PACT study  Steroid induced hyperglycemia. - SSI  BPH. - flomax  Best practice:  Diet: tube feeds DVT prophylaxis: lovenox GI prophylaxis: protonix Mobility: bed rest Code Status: full Disposition: ICU  Labs    CMP Latest Ref Rng & Units 04/30/2019 04/30/2019 04/29/2019  Glucose 70 - 99 mg/dL 146(H) - 147(H)  BUN 8 - 23 mg/dL 36(H) - 31(H)  Creatinine 0.61 - 1.24 mg/dL 0.82 - 0.77  Sodium 135 - 145 mmol/L 149(H) 147(H) 150(H)  Potassium 3.5 - 5.1 mmol/L 4.2 3.8 3.8  Chloride 98 - 111 mmol/L 103 - 107  CO2 22 - 32 mmol/L 33(H) -  33(H)  Calcium 8.9 - 10.3 mg/dL 9.5 - 9.7  Total Protein 6.5 - 8.1 g/dL 7.1 - 6.8  Total Bilirubin 0.3 - 1.2 mg/dL 0.3 - 0.4  Alkaline Phos 38 - 126 U/L 64 - 56  AST 15 - 41 U/L 118(H) - 55(H)  ALT 0 - 44 U/L 132(H) - 63(H)   CBC Latest Ref Rng & Units 04/30/2019 04/30/2019 04/29/2019  WBC 4.0 - 10.5 K/uL 15.6(H) - 17.3(H)  Hemoglobin 13.0 - 17.0 g/dL 10.7(L) 10.9(L) 10.3(L)  Hematocrit 39.0 - 52.0 % 33.1(L) 32.0(L) 31.9(L)  Platelets 150 - 400 K/uL 474(H) - 418(H)   ABG    Component Value Date/Time   PHART 7.400 04/30/2019 0451   PCO2ART 59.4 (H) 04/30/2019 0451   PO2ART 92.0 04/30/2019 0451   HCO3 35.5 (H) 04/30/2019 0451   TCO2 37 (H) 04/30/2019 0451   ACIDBASEDEF 3.0 (H) 04/25/2019 0510   O2SAT 94.0 04/30/2019 0451   CBG (last 3)  Recent Labs    04/29/19 2351 04/30/19 0356 04/30/19 0801  GLUCAP 166* 156* 144*    D/w Dr. Sherral Hammers  CC time 66 minutes  Chesley Mires, MD [place] 04/30/2019, 9:09 AM

## 2019-05-01 DIAGNOSIS — J80 Acute respiratory distress syndrome: Secondary | ICD-10-CM | POA: Diagnosis not present

## 2019-05-01 LAB — COMPREHENSIVE METABOLIC PANEL
ALT: 173 U/L — ABNORMAL HIGH (ref 0–44)
AST: 129 U/L — ABNORMAL HIGH (ref 15–41)
Albumin: 2.8 g/dL — ABNORMAL LOW (ref 3.5–5.0)
Alkaline Phosphatase: 59 U/L (ref 38–126)
Anion gap: 11 (ref 5–15)
BUN: 33 mg/dL — ABNORMAL HIGH (ref 8–23)
CO2: 31 mmol/L (ref 22–32)
Calcium: 9.5 mg/dL (ref 8.9–10.3)
Chloride: 103 mmol/L (ref 98–111)
Creatinine, Ser: 0.88 mg/dL (ref 0.61–1.24)
GFR calc Af Amer: >60 mL/min (ref >60)
GFR calc non Af Amer: >60 mL/min (ref >60)
Glucose, Bld: 148 mg/dL — ABNORMAL HIGH (ref 70–99)
Potassium: 4.4 mmol/L (ref 3.5–5.1)
Sodium: 145 mmol/L (ref 135–145)
Total Bilirubin: 0.5 mg/dL (ref 0.3–1.2)
Total Protein: 7.2 g/dL (ref 6.5–8.1)

## 2019-05-01 LAB — GLUCOSE, CAPILLARY
Glucose-Capillary: 173 mg/dL — ABNORMAL HIGH (ref 70–99)
Glucose-Capillary: 140 mg/dL — ABNORMAL HIGH (ref 70–99)
Glucose-Capillary: 146 mg/dL — ABNORMAL HIGH (ref 70–99)
Glucose-Capillary: 205 mg/dL — ABNORMAL HIGH (ref 70–99)
Glucose-Capillary: 176 mg/dL — ABNORMAL HIGH (ref 70–99)
Glucose-Capillary: 147 mg/dL — ABNORMAL HIGH (ref 70–99)

## 2019-05-01 LAB — CBC WITH DIFFERENTIAL/PLATELET
Abs Immature Granulocytes: 1.07 10*3/uL — ABNORMAL HIGH (ref 0.00–0.07)
Basophils Absolute: 0.1 10*3/uL (ref 0.0–0.1)
Basophils Relative: 1 %
Eosinophils Absolute: 0 10*3/uL (ref 0.0–0.5)
Eosinophils Relative: 0 %
HCT: 33.7 % — ABNORMAL LOW (ref 39.0–52.0)
Hemoglobin: 10.8 g/dL — ABNORMAL LOW (ref 13.0–17.0)
Immature Granulocytes: 7 %
Lymphocytes Relative: 14 %
Lymphs Abs: 2.1 10*3/uL (ref 0.7–4.0)
MCH: 29.8 pg (ref 26.0–34.0)
MCHC: 32 g/dL (ref 30.0–36.0)
MCV: 93.1 fL (ref 80.0–100.0)
Monocytes Absolute: 1.2 10*3/uL — ABNORMAL HIGH (ref 0.1–1.0)
Monocytes Relative: 8 %
Neutro Abs: 10.2 10*3/uL — ABNORMAL HIGH (ref 1.7–7.7)
Neutrophils Relative %: 70 %
Platelets: 462 10*3/uL — ABNORMAL HIGH (ref 150–400)
RBC: 3.62 MIL/uL — ABNORMAL LOW (ref 4.22–5.81)
RDW: 15.2 % (ref 11.5–15.5)
WBC: 14.7 10*3/uL — ABNORMAL HIGH (ref 4.0–10.5)
nRBC: 0.3 % — ABNORMAL HIGH (ref 0.0–0.2)

## 2019-05-01 LAB — PHOSPHORUS: Phosphorus: 4.3 mg/dL (ref 2.5–4.6)

## 2019-05-01 LAB — PROTIME-INR
INR: 1.2 (ref 0.8–1.2)
Prothrombin Time: 14.6 seconds (ref 11.4–15.2)

## 2019-05-01 LAB — MAGNESIUM: Magnesium: 2.2 mg/dL (ref 1.7–2.4)

## 2019-05-01 LAB — C-REACTIVE PROTEIN: CRP: 4.9 mg/dL — ABNORMAL HIGH (ref <1.0)

## 2019-05-01 LAB — D-DIMER, QUANTITATIVE: D-Dimer, Quant: 1.16 ug/mL-FEU — ABNORMAL HIGH (ref 0.00–0.50)

## 2019-05-01 MED ORDER — FREE WATER
100.0000 mL | Freq: Three times a day (TID) | Status: DC
  Administered 2019-05-01 – 2019-05-03 (×5): 100 mL

## 2019-05-01 NOTE — Progress Notes (Signed)
Pt's wife and two daughter's were able to facetime with him this afternoon.

## 2019-05-01 NOTE — Progress Notes (Signed)
NAME:  Dennis Moore, MRN:  [id number], DOB:  Oct 13, 1955, LOS: 7 ADMISSION DATE:  04/24/2019, CONSULTATION DATE:  10/14 REFERRING MD:  Alcario Drought, CHIEF COMPLAINT:  Dyspnea   Brief History   63 year old male brought to the emergency room on 1014 with dyspnea cough fever and hypoxemia from COVID-19 pneumonia.  He tested positive on October 7.  He was intubated in the emergency room and transferred to [facility].   Past Medical History  BPH Depression Hyperlipidemia Hypertension  Significant Hospital Events   10/14 Admit, start decadron and remdesivir 10/15 enroll in [place] study; prone positioning; convalescent plasma; GPC in blood cx >> start vancomycin 10/16 d/c prone positioning 10/18 persistent fever >> start rocephin; Coag neg Staph in blood culture >> contaminate, d/c vancomcin  Consults:  PCCM  Procedures:  10/14 ETT>   Significant Diagnostic Tests:    Micro Data:  10/14 SARS COV2 >> POSITIVE 10/14 Blood >> Coag neg Staph > micrococcus 10/17 Sputum 10/17 >> oral flora  Antimicrobials/COVID Rx:  10/14 remdesivir > 10/19 10/14 decadron >  10/15 convalescent plasma  Levaquin 10/14  Vancomycin 10/15 > 10/18 Rocephin 10/18 >> 10/19  Interim history/subjective:   Starting to wake up some Weaning on vent this morning  Objective   Blood pressure (!) 143/76, pulse 62, temperature 100 F (37.8 C), temperature source Axillary, resp. rate (!) 21, height 6' (1.829 m), weight 93.3 kg, SpO2 98 %.    Vent Mode: PRVC FiO2 (%):  [40 %] 40 % Set Rate:  [24 bmp] 24 bmp Vt Set:  [470 mL] 470 mL PEEP:  [8 cmH20-10 cmH20] 10 cmH20 Plateau Pressure:  [17 cmH20-21 cmH20] 20 cmH20   Intake/Output Summary (Last 24 hours) at 05/01/2019 [id number] Last data filed at 05/01/2019 0700 Gross per 24 hour  Intake 2000.21 ml  Output 1900 ml  Net 100.21 ml   Filed Weights   04/27/19 0500 04/28/19 0500 05/01/19 0500  Weight: 93 kg 93.3 kg 93.3 kg    Examination:   General:  In bed on vent HENT: NCAT ETT in place PULM: CTA B, vent supported breathing CV: RRR, no mgr GI: BS+, soft, nontender MSK: normal bulk and tone Neuro: awake, eyes open but not following commands    Resolved Hospital Problem list     Assessment & Plan:  ARDS due to COVID-19 pneumonia: improving oxygenation, ventilatory mechanics are OK Pressure support ventilation as tolerated today VAP prevention Wean sedation Hoping for extubation over next few days  Question healthcare associated pneumonia Monitor off of antibiotics  Need for sedation in setting of mechanical ventilation RASS target 0 to -1 WUA now Wean off versed and fentanyl Continue clonazepam and seroquel  Best practice:  Diet: tube feeding Pain/Anxiety/Delirium protocol (if indicated): yes, as above VAP protocol (if indicated): yes DVT prophylaxis: lovenox per COVID PACT trial GI prophylaxis: Pantoprazole for stress ulcer prophylaxis Glucose control: SSI Mobility: bed rest Code Status: FULL Family Communication: I updated his wife Juliann Pulse today by phone Disposition: remain in ICU  Labs   CBC: Recent Labs  Lab 04/27/19 0553  04/28/19 0548  04/29/19 0338 04/29/19 0534 04/30/19 0451 04/30/19 0545 05/01/19 0525  WBC 14.3*  --  18.4*  --   --  17.3*  --  15.6* 14.7*  NEUTROABS 11.6*  --  14.4*  --   --  12.6*  --  10.6* PENDING  HGB 10.2*   < > 10.3*   < > 10.2* 10.3* 10.9* 10.7* 10.8*  HCT  32.1*   < > 31.9*   < > 30.0* 31.9* 32.0* 33.1* 33.7*  MCV 93.0  --  93.3  --   --  92.7  --  92.7 93.1  PLT 341  --  415*  --   --  418*  --  474* 462*   < > = values in this interval not displayed.    Basic Metabolic Panel: Recent Labs  Lab 04/27/19 0553  04/28/19 0548  04/29/19 0338 04/29/19 0534 04/30/19 0451 04/30/19 0515 05/01/19 0525  NA 150*   < > 151*   < > 149* 150* 147* 149* 145  K 3.8   < > 3.8   < > 3.8 3.8 3.8 4.2 4.4  CL 110  --  111  --   --  107  --  103 103  CO2 29  --  30  --    --  33*  --  33* 31  GLUCOSE 148*  --  122*  --   --  147*  --  146* 148*  BUN 30*  --  28*  --   --  31*  --  36* 33*  CREATININE 0.81  --  0.78  --   --  0.77  --  0.82 0.88  CALCIUM 8.8*  --  9.3  --   --  9.7  --  9.5 9.5  MG 2.0  --  2.0  --   --  2.0  --  2.2 2.2  PHOS 2.1*  --  2.6  --   --  3.8  --  5.3* 4.3   < > = values in this interval not displayed.   GFR: Estimated Creatinine Clearance: 102 mL/min (by C-G formula based on SCr of 0.88 mg/dL). Recent Labs  Lab 04/24/19 2131  04/27/19 1800 04/28/19 0548 04/29/19 0534 04/30/19 0545 05/01/19 0525  PROCALCITON 0.31  --  <0.10  --   --   --   --   WBC  --    < >  --  18.4* 17.3* 15.6* 14.7*  LATICACIDVEN  --   --  1.6  --   --   --   --    < > = values in this interval not displayed.    Liver Function Tests: Recent Labs  Lab 04/27/19 0553 04/28/19 0548 04/29/19 0534 04/30/19 0515 05/01/19 0525  AST 54* 47* 55* 118* 129*  ALT 46* 50* 63* 132* 173*  ALKPHOS 59 60 56 64 59  BILITOT 0.5 0.4 0.4 0.3 0.5  PROT 6.7 6.8 6.8 7.1 7.2  ALBUMIN 2.7* 2.6* 2.5* 2.6* 2.8*   No results for input(s): LIPASE, AMYLASE in the last 168 hours. No results for input(s): AMMONIA in the last 168 hours.  ABG    Component Value Date/Time   PHART 7.400 04/30/2019 0451   PCO2ART 59.4 (H) 04/30/2019 0451   PO2ART 92.0 04/30/2019 0451   HCO3 35.5 (H) 04/30/2019 0451   TCO2 37 (H) 04/30/2019 0451   ACIDBASEDEF 3.0 (H) 04/25/2019 0510   O2SAT 94.0 04/30/2019 0451     Coagulation Profile: Recent Labs  Lab 04/26/19 0555 04/27/19 0553 04/28/19 0548 04/29/19 0534 04/30/19 0515  INR 1.2 1.1 1.1 1.2 1.2    Cardiac Enzymes: No results for input(s): CKTOTAL, CKMB, CKMBINDEX, TROPONINI in the last 168 hours.  HbA1C: Hgb A1c MFr Bld  Date/Time Value Ref Range Status  12/11/2012 11:10 AM 5.5 4.6 - 6.5 % Final    Comment:  Glycemic Control Guidelines for People with Diabetes:Non Diabetic:  <6%Goal of Therapy: <7%Additional Action  Suggested:  >8%   01/16/2008 08:47 AM 5.7 4.6 - 6.0 % Final    Comment:    See lab report for associated comment(s)    CBG: Recent Labs  Lab 04/30/19 1221 04/30/19 2050 04/30/19 2330 05/01/19 0305 05/01/19 0723  GLUCAP 145* 136* 154* 140* 146*     Critical care time: 35 minutes       Roselie Awkward, MD [place] [facility] Pager: [phone] Cell: [phone] If no response, call [phone]

## 2019-05-01 NOTE — Progress Notes (Signed)
Dennis Moore  [id number] DOB: 1956-05-13 DOA: 04/24/2019 PCP: Ann Held, DO    Brief Narrative:  63 year old with a history of depression, HTN, HLD, and BPH who suffered the onset of cough and fevers 10/6.  He tested positive for Covid on 10/8.  He presented to the ED when he developed worsening shortness of breath and respiratory distress.  His oxygen saturation was in the 80s on room air and improved only to 89% on nonrebreather mask.  He was tachypneic in the 30s and 40s.  Chest x-ray confirmed multifocal bilateral infiltrates.  Significant Events: 10/6 symptom onset 10/8 Covid positive 10/14 admit via [place] - intubated 10/15 enrolled in [place] PACT study  COVID-19 specific Treatment: Decadron 10/14 > Remdesivir 10/14 > 10/18 Convalescent plasma 10/15  Subjective: Waking up some with weaning of sedation for ventilator weaning trial.  No apparent distress.  No evidence of uncontrolled pain.  Assessment & Plan:  Covid pneumonia - ARDS - acute hypoxic and hypercapnic respiratory failure Ventilator management per PCCM -has completed the course of remdesivir -was dosed with convalescent plasma -Decadron continues  Recent Labs  Lab 04/24/19 2131  04/26/19 0555 04/27/19 0553 04/27/19 1800 04/28/19 0548 04/29/19 0534 04/30/19 0515 05/01/19 0525 05/01/19 0535  DDIMER 1.23*   < > 0.89* 1.54*  --  1.89* 1.44*  --  1.16*  --   FERRITIN 1,976*  --   --   --   --   --   --   --   --   --   CRP 25.5*   < > 10.7* 8.1*  --  15.2* 14.2*  --   --  4.9*  ALT  --    < > 43 46*  --  50* 63* 132* 173*  --   PROCALCITON 0.31  --   --   --  <0.10  --   --   --   --   --    < > = values in this interval not displayed.    Hypernatremia Corrected with free water administration  ?HCAP Presently off antibiotics - follow   Toxic metabolic encephalopathy Monitor mental status with weaning of sedation  Mild transaminitis Likely due to Covid itself versus remdesivir  -follow trend -hold statin for now  HTN Monitor blood pressure for now with no change in treatment  HLD  Steroid-induced hyperglycemia Continue sliding scale insulin -check A1c in a.m.  BPH  Nutrition Continue tube feeding as per nutrition  DVT prophylaxis: COVID PACT study Code Status: FULL CODE Family Communication:  Disposition Plan: ICU -ventilator weaning ongoing  Consultants:  PCCM  Antimicrobials:  Levaquin 10/14 Vancomycin 10/15 > 10/18 Rocephin 10/18 >  Objective: Blood pressure (!) 147/79, pulse 90, temperature 99.5 F (37.5 C), temperature source Axillary, resp. rate (!) 23, height 6' (1.829 m), weight 93.3 kg, SpO2 94 %.  Intake/Output Summary (Last 24 hours) at 05/01/2019 1347 Last data filed at 05/01/2019 1237 Gross per 24 hour  Intake 1933.15 ml  Output 1730 ml  Net 203.15 ml   Filed Weights   04/27/19 0500 04/28/19 0500 05/01/19 0500  Weight: 93 kg 93.3 kg 93.3 kg    Examination: General: No acute distress Lungs: Scattered diffuse crackles with no wheezing Cardiovascular: Regular rate and rhythm without murmur gallop or rub normal S1 and S2 Abdomen: Nontender, nondistended, soft, bowel sounds positive, no rebound, no ascites, no appreciable mass Extremities: No significant cyanosis, clubbing, or edema bilateral lower extremities  CBC: Recent  Labs  Lab 04/29/19 0534 04/30/19 0451 04/30/19 0545 05/01/19 0525  WBC 17.3*  --  15.6* 14.7*  NEUTROABS 12.6*  --  10.6* 10.2*  HGB 10.3* 10.9* 10.7* 10.8*  HCT 31.9* 32.0* 33.1* 33.7*  MCV 92.7  --  92.7 93.1  PLT 418*  --  474* [id number]*   Basic Metabolic Panel: Recent Labs  Lab 04/29/19 0534 04/30/19 0451 04/30/19 0515 05/01/19 0525  NA 150* 147* 149* 145  K 3.8 3.8 4.2 4.4  CL 107  --  103 103  CO2 33*  --  33* 31  GLUCOSE 147*  --  146* 148*  BUN 31*  --  36* 33*  CREATININE 0.77  --  0.82 0.88  CALCIUM 9.7  --  9.5 9.5  MG 2.0  --  2.2 2.2  PHOS 3.8  --  5.3* 4.3   GFR:  Estimated Creatinine Clearance: 102 mL/min (by C-G formula based on SCr of 0.88 mg/dL).  Liver Function Tests: Recent Labs  Lab 04/28/19 0548 04/29/19 0534 04/30/19 0515 05/01/19 0525  AST 47* 55* 118* 129*  ALT 50* 63* 132* 173*  ALKPHOS 60 56 64 59  BILITOT 0.4 0.4 0.3 0.5  PROT 6.8 6.8 7.1 7.2  ALBUMIN 2.6* 2.5* 2.6* 2.8*    Coagulation Profile: Recent Labs  Lab 04/27/19 0553 04/28/19 0548 04/29/19 0534 04/30/19 0515 05/01/19 0525  INR 1.1 1.1 1.2 1.2 1.2    HbA1C: Hgb A1c MFr Bld  Date/Time Value Ref Range Status  12/11/2012 11:10 AM 5.5 4.6 - 6.5 % Final    Comment:    Glycemic Control Guidelines for People with Diabetes:Non Diabetic:  <6%Goal of Therapy: <7%Additional Action Suggested:  >8%   01/16/2008 08:47 AM 5.7 4.6 - 6.0 % Final    Comment:    See lab report for associated comment(s)    CBG: Recent Labs  Lab 04/30/19 2050 04/30/19 2330 05/01/19 0305 05/01/19 0723 05/01/19 1208  GLUCAP 136* 154* 140* 146* 205*    Recent Results (from the past 240 hour(s))  SARS CORONAVIRUS 2 (TAT 6-24 HRS)     Status: Abnormal   Collection Time: 04/24/19  7:06 PM  Result Value Ref Range Status   SARS Coronavirus 2 POSITIVE (A) NEGATIVE Final    Comment: RESULT CALLED TO, READ BACK BY AND VERIFIED WITH: RN H BILLITER @ [phone] 04/25/19 BY S GEZAHEGN (NOTE) SARS-CoV-2 target nucleic acids are DETECTED. The SARS-CoV-2 RNA is generally detectable in upper and lower respiratory specimens during the acute phase of infection. Positive results are indicative of active infection with SARS-CoV-2. Clinical  correlation with patient history and other diagnostic information is necessary to determine patient infection status. Positive results do  not rule out bacterial infection or co-infection with other viruses. The expected result is Negative. Fact Sheet for Patients: SugarRoll.be Fact Sheet for Healthcare Providers:  https://www.woods-mathews.com/ This test is not yet approved or cleared by the [place] FDA and  has been authorized for detection and/or diagnosis of SARS-CoV-2 by FDA under an Emergency Use Authorization (EUA). This EUA will remain  in effect (meaning this test can be use d) for the duration of the COVID-19 declaration under Section 564(b)(1) of the Act, 21 U.S.C. section 360bbb-3(b)(1), unless the authorization is terminated or revoked sooner. Performed at [facility], [place] [address] [place], [place] [address]   Culture, blood (single) w Reflex to ID Panel     Status: Abnormal   Collection Time: 04/24/19 10:00 PM  Specimen: BLOOD LEFT FOREARM  Result Value Ref Range Status   Specimen Description BLOOD LEFT FOREARM  Final   Special Requests   Final    BOTTLES DRAWN AEROBIC AND ANAEROBIC Blood Culture adequate volume   Culture  Setup Time   Final    IN BOTH AEROBIC AND ANAEROBIC BOTTLES GRAM POSITIVE COCCI CRITICAL RESULT CALLED TO, READ BACK BY AND VERIFIED WITH: N GLOGOVAC PHARMD 04/25/19 2230 JDW    Culture (A)  Final    STAPHYLOCOCCUS SPECIES (COAGULASE NEGATIVE) THE SIGNIFICANCE OF ISOLATING THIS ORGANISM FROM A SINGLE SET OF BLOOD CULTURES WHEN MULTIPLE SETS ARE DRAWN IS UNCERTAIN. PLEASE NOTIFY THE MICROBIOLOGY DEPARTMENT WITHIN ONE WEEK IF SPECIATION AND SENSITIVITIES ARE REQUIRED. MICROCOCCUS SPECIES Standardized susceptibility testing for this organism is not available. Performed at [facility], [place] [address], [place], [place] [address]    Report Status 04/27/2019 FINAL  Final  MRSA PCR Screening     Status: None   Collection Time: 04/26/19  1:00 PM   Specimen: Nasopharyngeal  Result Value Ref Range Status   MRSA by PCR NEGATIVE NEGATIVE Final    Comment:        The GeneXpert MRSA Assay (FDA approved for NASAL specimens only), is one component of a comprehensive MRSA colonization surveillance program. It is not intended to  diagnose MRSA infection nor to guide or monitor treatment for MRSA infections. Performed at [facility], [place] [address], [place], [place] [address]   Culture, respiratory (non-expectorated)     Status: None   Collection Time: 04/27/19 10:54 AM   Specimen: Tracheal Aspirate; Respiratory  Result Value Ref Range Status   Specimen Description   Final    TRACHEAL ASPIRATE Performed at [place] [address], [place], [place] [address]    Special Requests   Final    NONE Performed at [facility], [place] [address] [place], [place] [address]    Gram Stain   Final    FEW WBC PRESENT, PREDOMINANTLY PMN FEW GRAM POSITIVE COCCI IN PAIRS RARE GRAM POSITIVE RODS    Culture   Final    Consistent with normal respiratory flora. Performed at [facility], [place] [address], [place], [place] [address]    Report Status 04/29/2019 FINAL  Final     Scheduled Meds: . aspirin  81 mg Per Tube Daily  . chlorhexidine  15 mL Mouth/Throat BID  . Chlorhexidine Gluconate Cloth  6 each Topical Daily  . cholecalciferol  1,000 Units Per Tube Daily  . clonazepam  1 mg Per Tube BID  . dexamethasone (DECADRON) injection  6 mg Intravenous Q24H  . enoxaparin (LOVENOX) injection  40 mg Subcutaneous Q24H  . feeding supplement (PRO-STAT SUGAR FREE 64)  60 mL Per Tube TID  . FLUoxetine  20 mg Per Tube Daily  . free water  200 mL Per Tube Q6H  . insulin aspart  0-9 Units Subcutaneous Q4H  . mouth rinse  15 mL Mouth Rinse 10 times per day  . pantoprazole sodium  40 mg Per Tube Daily  . polyethylene glycol  17 g Per Tube Daily  . QUEtiapine  50 mg Per Tube QHS  . rosuvastatin  20 mg Per Tube Daily  . tamsulosin  0.4 mg Oral BID  . vitamin C  500 mg Per Tube Daily  . zinc sulfate  220 mg Per Tube Daily   Continuous Infusions: . sodium chloride Stopped (04/28/19 1404)  .  feeding supplement (VITAL 1.5 CAL) 40 mL/hr at 05/01/19 0700  . fentaNYL  infusion INTRAVENOUS 75 mcg/hr (05/01/19 1308)  . midazolam Stopped (05/01/19 1036)     LOS: 7 days   Cherene Altes, MD [facility] Office  [phone] Pager - Text Page per Amion  If 7PM-7AM, please contact night-coverage per Amion 05/01/2019, 1:47 PM

## 2019-05-01 NOTE — Progress Notes (Signed)
Discussed Dennis Moore's care and progress with his daughter. Daughter expressed slight concern regarding her father not following commands yet. Daughter confirmed I answered all of her questions.

## 2019-05-02 DIAGNOSIS — U071 COVID-19: Secondary | ICD-10-CM | POA: Diagnosis present

## 2019-05-02 LAB — POCT I-STAT 7, (LYTES, BLD GAS, ICA,H+H)
Acid-Base Excess: 8 mmol/L — ABNORMAL HIGH (ref 0.0–2.0)
Bicarbonate: 32.8 mmol/L — ABNORMAL HIGH (ref 20.0–28.0)
Calcium, Ion: 1.28 mmol/L (ref 1.15–1.40)
HCT: 35 % — ABNORMAL LOW (ref 39.0–52.0)
Hemoglobin: 11.9 g/dL — ABNORMAL LOW (ref 13.0–17.0)
O2 Saturation: 96 %
Patient temperature: 100.7
Potassium: 3.9 mmol/L (ref 3.5–5.1)
Sodium: 145 mmol/L (ref 135–145)
TCO2: 34 mmol/L — ABNORMAL HIGH (ref 22–32)
pCO2 arterial: 46.9 mmHg (ref 32.0–48.0)
pH, Arterial: 7.457 — ABNORMAL HIGH (ref 7.350–7.450)
pO2, Arterial: 87 mmHg (ref 83.0–108.0)

## 2019-05-02 LAB — CBC WITH DIFFERENTIAL/PLATELET
Abs Immature Granulocytes: 0.73 10*3/uL — ABNORMAL HIGH (ref 0.00–0.07)
Basophils Absolute: 0.1 10*3/uL (ref 0.0–0.1)
Basophils Relative: 0 %
Eosinophils Absolute: 0 10*3/uL (ref 0.0–0.5)
Eosinophils Relative: 0 %
HCT: 36.2 % — ABNORMAL LOW (ref 39.0–52.0)
Hemoglobin: 11.7 g/dL — ABNORMAL LOW (ref 13.0–17.0)
Immature Granulocytes: 4 %
Lymphocytes Relative: 14 %
Lymphs Abs: 2.3 10*3/uL (ref 0.7–4.0)
MCH: 29.6 pg (ref 26.0–34.0)
MCHC: 32.3 g/dL (ref 30.0–36.0)
MCV: 91.6 fL (ref 80.0–100.0)
Monocytes Absolute: 1 10*3/uL (ref 0.1–1.0)
Monocytes Relative: 6 %
Neutro Abs: 12.7 10*3/uL — ABNORMAL HIGH (ref 1.7–7.7)
Neutrophils Relative %: 76 %
Platelets: 465 10*3/uL — ABNORMAL HIGH (ref 150–400)
RBC: 3.95 MIL/uL — ABNORMAL LOW (ref 4.22–5.81)
RDW: 14.9 % (ref 11.5–15.5)
WBC: 16.8 10*3/uL — ABNORMAL HIGH (ref 4.0–10.5)
nRBC: 0 % (ref 0.0–0.2)

## 2019-05-02 LAB — COMPREHENSIVE METABOLIC PANEL
ALT: 207 U/L — ABNORMAL HIGH (ref 0–44)
AST: 104 U/L — ABNORMAL HIGH (ref 15–41)
Albumin: 3 g/dL — ABNORMAL LOW (ref 3.5–5.0)
Alkaline Phosphatase: 72 U/L (ref 38–126)
Anion gap: 14 (ref 5–15)
BUN: 35 mg/dL — ABNORMAL HIGH (ref 8–23)
CO2: 29 mmol/L (ref 22–32)
Calcium: 9.5 mg/dL (ref 8.9–10.3)
Chloride: 101 mmol/L (ref 98–111)
Creatinine, Ser: 0.86 mg/dL (ref 0.61–1.24)
GFR calc Af Amer: >60 mL/min (ref >60)
GFR calc non Af Amer: >60 mL/min (ref >60)
Glucose, Bld: 137 mg/dL — ABNORMAL HIGH (ref 70–99)
Potassium: 3.8 mmol/L (ref 3.5–5.1)
Sodium: 144 mmol/L (ref 135–145)
Total Bilirubin: 0.6 mg/dL (ref 0.3–1.2)
Total Protein: 7.5 g/dL (ref 6.5–8.1)

## 2019-05-02 LAB — HEMOGLOBIN A1C
Hgb A1c MFr Bld: 6.7 % — ABNORMAL HIGH (ref 4.8–5.6)
Mean Plasma Glucose: 145.59 mg/dL

## 2019-05-02 LAB — GLUCOSE, CAPILLARY
Glucose-Capillary: 128 mg/dL — ABNORMAL HIGH (ref 70–99)
Glucose-Capillary: 147 mg/dL — ABNORMAL HIGH (ref 70–99)
Glucose-Capillary: 167 mg/dL — ABNORMAL HIGH (ref 70–99)
Glucose-Capillary: 175 mg/dL — ABNORMAL HIGH (ref 70–99)
Glucose-Capillary: 209 mg/dL — ABNORMAL HIGH (ref 70–99)
Glucose-Capillary: 214 mg/dL — ABNORMAL HIGH (ref 70–99)

## 2019-05-02 LAB — D-DIMER, QUANTITATIVE: D-Dimer, Quant: 1.62 ug/mL-FEU — ABNORMAL HIGH (ref 0.00–0.50)

## 2019-05-02 LAB — FIBRINOGEN: Fibrinogen: >800 mg/dL — ABNORMAL HIGH (ref 210–475)

## 2019-05-02 LAB — C-REACTIVE PROTEIN: CRP: 2.7 mg/dL — ABNORMAL HIGH (ref <1.0)

## 2019-05-02 LAB — PROTIME-INR
INR: 1.1 (ref 0.8–1.2)
Prothrombin Time: 13.9 seconds (ref 11.4–15.2)

## 2019-05-02 MED ORDER — MIDAZOLAM HCL 2 MG/2ML IJ SOLN
1.0000 mg | INTRAMUSCULAR | Status: DC | PRN
  Administered 2019-05-02: 2 mg via INTRAVENOUS
  Administered 2019-05-02: 1 mg via INTRAVENOUS
  Administered 2019-05-02 – 2019-05-03 (×3): 2 mg via INTRAVENOUS
  Filled 2019-05-02 (×5): qty 2

## 2019-05-02 MED ORDER — SIMETHICONE 40 MG/0.6ML PO SUSP
40.0000 mg | Freq: Four times a day (QID) | ORAL | Status: DC
  Filled 2019-05-02 (×5): qty 0.6

## 2019-05-02 MED ORDER — BETHANECHOL CHLORIDE 10 MG PO TABS
10.0000 mg | ORAL_TABLET | Freq: Four times a day (QID) | ORAL | Status: DC
  Administered 2019-05-02: 13:00:00 10 mg via ORAL
  Filled 2019-05-02 (×5): qty 1

## 2019-05-02 MED ORDER — SIMETHICONE 80 MG PO CHEW
40.0000 mg | CHEWABLE_TABLET | Freq: Four times a day (QID) | ORAL | Status: DC
  Administered 2019-05-02 – 2019-05-05 (×10): 40 mg via ORAL
  Filled 2019-05-02 (×17): qty 1

## 2019-05-02 MED ORDER — BETHANECHOL CHLORIDE 10 MG PO TABS
10.0000 mg | ORAL_TABLET | Freq: Four times a day (QID) | ORAL | Status: DC
  Administered 2019-05-02 – 2019-05-03 (×2): 10 mg
  Filled 2019-05-02 (×10): qty 1

## 2019-05-02 MED ORDER — FENTANYL CITRATE (PF) 100 MCG/2ML IJ SOLN
50.0000 ug | INTRAMUSCULAR | Status: DC | PRN
  Administered 2019-05-02 (×2): 50 ug via INTRAVENOUS
  Filled 2019-05-02 (×3): qty 2

## 2019-05-02 MED ORDER — FENTANYL CITRATE (PF) 100 MCG/2ML IJ SOLN
50.0000 ug | INTRAMUSCULAR | Status: DC | PRN
  Administered 2019-05-02 (×2): 100 ug via INTRAVENOUS
  Administered 2019-05-02: 150 ug via INTRAVENOUS
  Administered 2019-05-03: 200 ug via INTRAVENOUS
  Administered 2019-05-03 (×2): 100 ug via INTRAVENOUS
  Administered 2019-05-03: 200 ug via INTRAVENOUS
  Filled 2019-05-02: qty 2
  Filled 2019-05-02: qty 4
  Filled 2019-05-02 (×2): qty 2
  Filled 2019-05-02: qty 4
  Filled 2019-05-02: qty 2
  Filled 2019-05-02: qty 4
  Filled 2019-05-02: qty 2

## 2019-05-02 MED ORDER — VITAL 1.5 CAL PO LIQD
1000.0000 mL | ORAL | Status: DC
  Administered 2019-05-02 – 2019-05-03 (×2): 1000 mL
  Filled 2019-05-02 (×4): qty 1000

## 2019-05-02 NOTE — Progress Notes (Addendum)
1055: Discussed patient care and progress with patient's daughter. Daughter confirmed all questions answered.  1650: wasting Fentanyl 225 mL and wasting Versed 40 mL with second RN [employer] in [place].

## 2019-05-02 NOTE — Progress Notes (Signed)
ANTICOAGULATION CONSULT NOTE - Initial Consult  Pharmacy Consult for Lovenox Indication: VTE prophylaxis (COVID-PACT trial - prophylaxis dose)   Allergies  Allergen Reactions  . Penicillins Swelling and Rash    Patient Measurements: Height: 6' (182.9 cm) Weight: 110 lb 3.7 oz (50 kg) IBW/kg (Calculated) : 77.6  Vital Signs: Temp: 101.2 F (38.4 C) (10/22 0746) Temp Source: Oral (10/22 0746) BP: 114/67 (10/22 0746) Pulse Rate: 83 (10/22 0600)  Labs: Recent Labs    04/30/19 0515 04/30/19 0545 05/01/19 0525 05/02/19 0525 05/02/19 0530  HGB  --  10.7* 10.8* 11.7* 11.9*  HCT  --  33.1* 33.7* 36.2* 35.0*  PLT  --  474* 462* 465*  --   LABPROT 14.7  --  14.6 13.9  --   INR 1.2  --  1.2 1.1  --   CREATININE 0.82  --  0.88 0.86  --     Estimated Creatinine Clearance: 62.2 mL/min (by C-G formula based on SCr of 0.86 mg/dL).   Medical History: Past Medical History:  Diagnosis Date  . Allergic rhinitis   . BPH (benign prostatic hypertrophy)   . Depression   . Erectile dysfunction   . Hyperlipidemia   . Hypertension   . Internal hemorrhoids   . Tubular adenoma of colon 11/2008    Medications:  Medications Prior to Admission  Medication Sig Dispense Refill Last Dose  . albuterol (PROVENTIL HFA;VENTOLIN HFA) 108 (90 Base) MCG/ACT inhaler Inhale 2 puffs into the lungs every 6 (six) hours as needed for wheezing. 54 Inhaler 3 04/24/2019 at Unknown time  . amLODipine (NORVASC) 5 MG tablet Take 5 mg by mouth daily.   04/23/2019  . aspirin EC 81 MG tablet Take 81 mg by mouth daily.   04/23/2019  . atorvastatin (LIPITOR) 80 MG tablet Take 80 mg by mouth daily.   04/23/2019  . [EXPIRED] benzonatate (TESSALON) 200 MG capsule Take 200 mg by mouth 3 (three) times daily as needed for cough.   04/23/2019  . cholecalciferol (VITAMIN D3) 25 MCG (1000 UT) tablet Take 1,000 Units by mouth daily.   04/23/2019  . FLUoxetine (PROZAC) 20 MG capsule Take 20 mg by mouth daily.   04/23/2019   . lisinopril-hydrochlorothiazide (PRINZIDE,ZESTORETIC) 20-12.5 MG tablet Take 2 tablets by mouth daily. 60 tablet 11 04/23/2019  . omeprazole (PRILOSEC) 20 MG capsule Take 20 mg by mouth daily as needed for indigestion.   Past Week at Unknown time  . sildenafil (VIAGRA) 100 MG tablet Take 0.5 tablets (50 mg total) by mouth daily as needed for erectile dysfunction. 10 tablet 0 unk  . vitamin C (ASCORBIC ACID) 500 MG tablet Take 500 mg by mouth daily.   04/23/2019  . zinc gluconate 50 MG tablet Take 50 mg by mouth daily.   04/23/2019  . rosuvastatin (CRESTOR) 20 MG tablet Take 1 tablet (20 mg total) by mouth daily. 30 tablet 5     Assessment: Dennis Moore admitted to the ICU with respiratory failure secondary to COVID-19 pneumonia. Pharmacy consulted to transition Lovenox to COVID-PACT regimen for VTE prophylaxis. Patient randomized to the prophylaxis dose arm.  Hgb low but improved at 11.7, platelets remain elevated at 465. CrCl > 30 ml/min, BMI < 35. No overt bleeding noted.  Goal of Therapy:  VTE prophylaxis Monitor platelets by anticoagulation protocol: Yes   Plan:  -Continue Lovenox 40 mg SQ daily  -Monitor CBC and renal function   Richardine Service, PharmD PGY1 Pharmacy Resident 05/02/2019  8:20 AM

## 2019-05-02 NOTE — Progress Notes (Signed)
NAME:  Dennis Moore, MRN:  [id number], DOB:  [birth year], LOS: 8 ADMISSION DATE:  04/24/2019, CONSULTATION DATE:  10/14 REFERRING MD:  Alcario Drought, CHIEF COMPLAINT:  Dyspnea   Brief History   63 year old male brought to the emergency room on 1014 with dyspnea cough fever and hypoxemia from COVID-19 pneumonia.  He tested positive on October 7.  He was intubated in the emergency room and transferred to [facility].  Past Medical History  BPH Depression Hyperlipidemia Hypertension  Significant Hospital Events   10/14 Admit, start decadron and remdesivir 10/15 enroll in [place] study; prone positioning; convalescent plasma; GPC in blood cx >> start vancomycin 10/16 d/c prone positioning 10/18 persistent fever >> start rocephin; Coag neg Staph in blood culture >> contaminate, d/c vancomcin 10/21 weaned on vent all day, confused  Consults:  PCCM  Procedures:  10/14 ETT>   Significant Diagnostic Tests:    Micro Data:  10/14 SARS COV2 >> POSITIVE 10/14 Blood >> Coag neg Staph > micrococcus 10/17 Sputum 10/17 >> oral flora  Antimicrobials/COVID Rx:  10/14 remdesivir > 10/19 10/14 decadron >  10/15 convalescent plasma  Levaquin 10/14  Vancomycin 10/15 > 10/18 Rocephin 10/18 >> 10/19  Interim history/subjective:   Weaning on vent  More awake, following commands   Objective   Blood pressure 114/67, pulse 83, temperature (!) 101.2 F (38.4 C), temperature source Oral, resp. rate (!) 24, height 6' (1.829 m), weight 50 kg, SpO2 95 %.    Vent Mode: PRVC FiO2 (%):  [40 %] 40 % Set Rate:  [24 bmp] 24 bmp Vt Set:  [470 mL] 470 mL PEEP:  [8 cmH20] 8 cmH20 Pressure Support:  [8 [id number] cmH20] 8 cmH20 Plateau Pressure:  [14 cmH20-18 cmH20] 18 cmH20   Intake/Output Summary (Last 24 hours) at 05/02/2019 0758 Last data filed at 05/02/2019 0600 Gross per 24 hour  Intake 1934.57 ml  Output 2606 ml  Net -671.43 ml   Filed Weights   04/28/19 0500 05/01/19 0500  05/02/19 0500  Weight: 93.3 kg 93.3 kg 50 kg    Examination:  General:  In bed on vent HENT: NCAT ETT in place PULM: CTA B, vent supported breathing CV: RRR, no mgr GI: BS+, soft, nontender MSK: normal bulk and tone Neuro: awake, doesn't follow commands or nod head    Resolved Hospital Problem list     Assessment & Plan:  ARDS due to COVID-19 pneumonia: improving oxygenation, ventilatory mechanics are OK Pressure support again as long as tolerated VAP prevention  Hold sedation Hopeful for extubation if mental status improves  Question healthcare associated pneumonia Continue to monitor off of antibiotics  Need for sedation in setting of mechanical ventilation RASS target 0 to -1 WUA now D/C fentanyl/versed infusions Use fentanyl prn only  Stop clonazepam, seroquel today  Best practice:  Diet: tube feeding Pain/Anxiety/Delirium protocol (if indicated): yes, as above VAP protocol (if indicated): yes DVT prophylaxis: lovenox per COVID PACT trial GI prophylaxis: Pantoprazole for stress ulcer prophylaxis Glucose control: SSI Mobility: bed rest Code Status: FULL Family Communication: Updated Kathy by phone today Disposition: remain in ICU  Labs   CBC: Recent Labs  Lab 04/28/19 0548  04/29/19 0534 04/30/19 0451 04/30/19 0545 05/01/19 0525 05/02/19 0525 05/02/19 0530  WBC 18.4*  --  17.3*  --  15.6* 14.7* 16.8*  --   NEUTROABS 14.4*  --  12.6*  --  10.6* 10.2* 12.7*  --   HGB 10.3*   < > 10.3* 10.9* 10.7*  10.8* 11.7* 11.9*  HCT 31.9*   < > 31.9* 32.0* 33.1* 33.7* 36.2* 35.0*  MCV 93.3  --  92.7  --  92.7 93.1 91.6  --   PLT 415*  --  418*  --  474* 462* 465*  --    < > = values in this interval not displayed.    Basic Metabolic Panel: Recent Labs  Lab 04/27/19 0553  04/28/19 0548  04/29/19 0534 04/30/19 0451 04/30/19 0515 05/01/19 0525 05/02/19 0525 05/02/19 0530  NA 150*   < > 151*   < > 150* 147* 149* 145 144 145  K 3.8   < > 3.8   < > 3.8 3.8  4.2 4.4 3.8 3.9  CL 110  --  111  --  107  --  103 103 101  --   CO2 29  --  30  --  33*  --  33* 31 29  --   GLUCOSE 148*  --  122*  --  147*  --  146* 148* 137*  --   BUN 30*  --  28*  --  31*  --  36* 33* 35*  --   CREATININE 0.81  --  0.78  --  0.77  --  0.82 0.88 0.86  --   CALCIUM 8.8*  --  9.3  --  9.7  --  9.5 9.5 9.5  --   MG 2.0  --  2.0  --  2.0  --  2.2 2.2  --   --   PHOS 2.1*  --  2.6  --  3.8  --  5.3* 4.3  --   --    < > = values in this interval not displayed.   GFR: Estimated Creatinine Clearance: 62.2 mL/min (by C-G formula based on SCr of 0.86 mg/dL). Recent Labs  Lab 04/27/19 1800  04/29/19 0534 04/30/19 0545 05/01/19 0525 05/02/19 0525  PROCALCITON <0.10  --   --   --   --   --   WBC  --    < > 17.3* 15.6* 14.7* 16.8*  LATICACIDVEN 1.6  --   --   --   --   --    < > = values in this interval not displayed.    Liver Function Tests: Recent Labs  Lab 04/28/19 0548 04/29/19 0534 04/30/19 0515 05/01/19 0525 05/02/19 0525  AST 47* 55* 118* 129* 104*  ALT 50* 63* 132* 173* 207*  ALKPHOS 60 56 64 59 72  BILITOT 0.4 0.4 0.3 0.5 0.6  PROT 6.8 6.8 7.1 7.2 7.5  ALBUMIN 2.6* 2.5* 2.6* 2.8* 3.0*   No results for input(s): LIPASE, AMYLASE in the last 168 hours. No results for input(s): AMMONIA in the last 168 hours.  ABG    Component Value Date/Time   PHART 7.457 (H) 05/02/2019 0530   PCO2ART 46.9 05/02/2019 0530   PO2ART 87.0 05/02/2019 0530   HCO3 32.8 (H) 05/02/2019 0530   TCO2 34 (H) 05/02/2019 0530   ACIDBASEDEF 3.0 (H) 04/25/2019 0510   O2SAT 96.0 05/02/2019 0530     Coagulation Profile: Recent Labs  Lab 04/27/19 0553 04/28/19 0548 04/29/19 0534 04/30/19 0515 05/01/19 0525  INR 1.1 1.1 1.2 1.2 1.2    Cardiac Enzymes: No results for input(s): CKTOTAL, CKMB, CKMBINDEX, TROPONINI in the last 168 hours.  HbA1C: Hgb A1c MFr Bld  Date/Time Value Ref Range Status  12/11/2012 11:10 AM 5.5 4.6 - 6.5 % Final  Comment:    Glycemic Control  Guidelines for People with Diabetes:Non Diabetic:  <6%Goal of Therapy: <7%Additional Action Suggested:  >8%   01/16/2008 08:47 AM 5.7 4.6 - 6.0 % Final    Comment:    See lab report for associated comment(s)    CBG: Recent Labs  Lab 05/01/19 1555 05/01/19 2024 05/02/19 0023 05/02/19 0442 05/02/19 0739  GLUCAP 176* 147* 147* 128* 167*     Critical care time: 35 minutes       Roselie Awkward, MD [facility] Pager: [phone] Cell: [phone] If no response, call [phone]

## 2019-05-02 NOTE — Progress Notes (Signed)
Dennis Moore  [id number] DOB: 06-18-1956 DOA: 04/24/2019 PCP: Ann Held, DO    Brief Narrative:  63 year old with a history of depression, HTN, HLD, and BPH who suffered the onset of cough and fevers 10/6.  He tested positive for Covid on 10/8.  He presented to the ED when he developed worsening shortness of breath and respiratory distress.  His oxygen saturation was in the 80s on room air and improved only to 89% on nonrebreather mask.  He was tachypneic in the 30s and 40s.  Chest x-ray confirmed multifocal bilateral infiltrates.  Significant Events: 10/6 symptom onset 10/8 Covid positive 10/14 admit via [facility] - intubated 10/15 enrolled in Covid PACT study  COVID-19 specific Treatment: Decadron 10/14 > Remdesivir 10/14 > 10/18 Convalescent plasma 10/15  Subjective: The patient appears to be making slow steady progress.  He weaned for prolonged period of time successfully yesterday.  The plan is to attempt to wean him again today and if he does well to consider extubation.  There is no evidence of respiratory distress.  He is following the examiner but is somewhat sedate and therefore not able to follow simple commands at the time of rounds.  Assessment & Plan:  Covid pneumonia - ARDS - acute hypoxic and hypercapnic respiratory failure Ventilator management per PCCM -has completed a course of remdesivir -was dosed with convalescent plasma - Decadron continues -potential extubation today  Recent Labs  Lab 04/27/19 0553 04/27/19 1800 04/28/19 0548 04/29/19 0534 04/30/19 0515 05/01/19 0525 05/01/19 0535 05/02/19 0525  DDIMER 1.54*  --  1.89* 1.44*  --  1.16*  --  1.62*  CRP 8.1*  --  15.2* 14.2*  --   --  4.9* 2.7*  ALT 46*  --  50* 63* 132* 173*  --  207*  PROCALCITON  --  <0.10  --   --   --   --   --   --     Hypernatremia Corrected with free water administration -recheck in a.m.   ?HCAP Presently off antibiotics -no clinical suggestion of ongoing  infection at this time  Toxic metabolic encephalopathy Monitor mental status with continued weaning of sedation  Mild transaminitis Likely due to Covid itself versus remdesivir -follow trend -hold statin for now  Recent Labs  Lab 04/28/19 0548 04/29/19 0534 04/30/19 0515 05/01/19 0525 05/02/19 0525  AST 47* 55* 118* 129* 104*  ALT 50* 63* 132* 173* 207*  ALKPHOS 60 56 64 59 72  BILITOT 0.4 0.4 0.3 0.5 0.6  PROT 6.8 6.8 7.1 7.2 7.5  ALBUMIN 2.6* 2.5* 2.6* 2.8* 3.0*    HTN Monitor blood pressure for now with no change in treatment  HLD Holding statin in the setting of mild transaminitis  Steroid-induced hyperglycemia -newly diagnosed diabetes Continue sliding scale insulin - A1c 6.7 therefore technically meets criteria for diabetes -before making formal diagnosis will suggest follow-up in outpatient setting after complete recovery from Covid  BPH Having symptoms of intermittent retention -continue Flomax -and Urecholine and follow  Nutrition Continue tube feeding as per nutrition until improved to the point of tolerating oral intake  DVT prophylaxis: COVID PACT study Code Status: FULL CODE Family Communication:  Disposition Plan: ICU - ventilator weaning ongoing  Consultants:  PCCM  Antimicrobials:  Levaquin 10/14 Vancomycin 10/15 > 10/18 Rocephin 10/18 > 10/19  Objective: Blood pressure 114/67, pulse 83, temperature (!) 101.2 F (38.4 C), temperature source Oral, resp. rate (!) 24, height 6' (1.829 m), weight 50 kg,  SpO2 95 %.  Intake/Output Summary (Last 24 hours) at 05/02/2019 0809 Last data filed at 05/02/2019 0600 Gross per 24 hour  Intake 1880.11 ml  Output 2526 ml  Net -645.89 ml   Filed Weights   04/28/19 0500 05/01/19 0500 05/02/19 0500  Weight: 93.3 kg 93.3 kg 50 kg    Examination: General: No acute distress -alert but somewhat sedate Lungs: Faint diffuse crackles with no wheezing Cardiovascular: RRR without murmur or rub Abdomen: NT/ND,  soft, BS positive Extremities: Trace edema bilateral lower extremities  CBC: Recent Labs  Lab 04/30/19 0545 05/01/19 0525 05/02/19 0525 05/02/19 0530  WBC 15.6* 14.7* 16.8*  --   NEUTROABS 10.6* 10.2* 12.7*  --   HGB 10.7* 10.8* 11.7* 11.9*  HCT 33.1* 33.7* 36.2* 35.0*  MCV 92.7 93.1 91.6  --   PLT 474* 462* 465*  --    Basic Metabolic Panel: Recent Labs  Lab 04/29/19 0534  04/30/19 0515 05/01/19 0525 05/02/19 0525 05/02/19 0530  NA 150*   < > 149* 145 144 145  K 3.8   < > 4.2 4.4 3.8 3.9  CL 107  --  103 103 101  --   CO2 33*  --  33* 31 29  --   GLUCOSE 147*  --  146* 148* 137*  --   BUN 31*  --  36* 33* 35*  --   CREATININE 0.77  --  0.82 0.88 0.86  --   CALCIUM 9.7  --  9.5 9.5 9.5  --   MG 2.0  --  2.2 2.2  --   --   PHOS 3.8  --  5.3* 4.3  --   --    < > = values in this interval not displayed.   GFR: Estimated Creatinine Clearance: 62.2 mL/min (by C-G formula based on SCr of 0.86 mg/dL).  Liver Function Tests: Recent Labs  Lab 04/29/19 0534 04/30/19 0515 05/01/19 0525 05/02/19 0525  AST 55* 118* 129* 104*  ALT 63* 132* 173* 207*  ALKPHOS 56 64 59 72  BILITOT 0.4 0.3 0.5 0.6  PROT 6.8 7.1 7.2 7.5  ALBUMIN 2.5* 2.6* 2.8* 3.0*    Coagulation Profile: Recent Labs  Lab 04/28/19 0548 04/29/19 0534 04/30/19 0515 05/01/19 0525 05/02/19 0525  INR 1.1 1.2 1.2 1.2 1.1    HbA1C: Hgb A1c MFr Bld  Date/Time Value Ref Range Status  12/11/2012 11:10 AM 5.5 4.6 - 6.5 % Final    Comment:    Glycemic Control Guidelines for People with Diabetes:Non Diabetic:  <6%Goal of Therapy: <7%Additional Action Suggested:  >8%   01/16/2008 08:47 AM 5.7 4.6 - 6.0 % Final    Comment:    See lab report for associated comment(s)    CBG: Recent Labs  Lab 05/01/19 1555 05/01/19 2024 05/02/19 0023 05/02/19 0442 05/02/19 0739  GLUCAP 176* 147* 147* 128* 167*    Recent Results (from the past 240 hour(s))  SARS CORONAVIRUS 2 (TAT 6-24 HRS)     Status: Abnormal    Collection Time: 04/24/19  7:06 PM  Result Value Ref Range Status   SARS Coronavirus 2 POSITIVE (A) NEGATIVE Final    Comment: RESULT CALLED TO, READ BACK BY AND VERIFIED WITH: RN H BILLITER @ [phone] 04/25/19 BY S GEZAHEGN (NOTE) SARS-CoV-2 target nucleic acids are DETECTED. The SARS-CoV-2 RNA is generally detectable in upper and lower respiratory specimens during the acute phase of infection. Positive results are indicative of active infection with SARS-CoV-2. Clinical  correlation  with patient history and other diagnostic information is necessary to determine patient infection status. Positive results do  not rule out bacterial infection or co-infection with other viruses. The expected result is Negative. Fact Sheet for Patients: SugarRoll.be Fact Sheet for Healthcare Providers: https://www.woods-mathews.com/ This test is not yet approved or cleared by the [place] FDA and  has been authorized for detection and/or diagnosis of SARS-CoV-2 by FDA under an Emergency Use Authorization (EUA). This EUA will remain  in effect (meaning this test can be use d) for the duration of the COVID-19 declaration under Section 564(b)(1) of the Act, 21 U.S.C. section 360bbb-3(b)(1), unless the authorization is terminated or revoked sooner. Performed at [facility], [place] [address], [place], [place] [address]   Culture, blood (single) w Reflex to ID Panel     Status: Abnormal   Collection Time: 04/24/19 10:00 PM   Specimen: BLOOD LEFT FOREARM  Result Value Ref Range Status   Specimen Description BLOOD LEFT FOREARM  Final   Special Requests   Final    BOTTLES DRAWN AEROBIC AND ANAEROBIC Blood Culture adequate volume   Culture  Setup Time   Final    IN BOTH AEROBIC AND ANAEROBIC BOTTLES GRAM POSITIVE COCCI CRITICAL RESULT CALLED TO, READ BACK BY AND VERIFIED WITH: N GLOGOVAC PHARMD 04/25/19 2230 JDW    Culture (A)  Final    STAPHYLOCOCCUS SPECIES  (COAGULASE NEGATIVE) THE SIGNIFICANCE OF ISOLATING THIS ORGANISM FROM A SINGLE SET OF BLOOD CULTURES WHEN MULTIPLE SETS ARE DRAWN IS UNCERTAIN. PLEASE NOTIFY THE MICROBIOLOGY DEPARTMENT WITHIN ONE WEEK IF SPECIATION AND SENSITIVITIES ARE REQUIRED. MICROCOCCUS SPECIES Standardized susceptibility testing for this organism is not available. Performed at [facility], [place] [address], [place], [place] [address]    Report Status 04/27/2019 FINAL  Final  MRSA PCR Screening     Status: None   Collection Time: 04/26/19  1:00 PM   Specimen: Nasopharyngeal  Result Value Ref Range Status   MRSA by PCR NEGATIVE NEGATIVE Final    Comment:        The GeneXpert MRSA Assay (FDA approved for NASAL specimens only), is one component of a comprehensive MRSA colonization surveillance program. It is not intended to diagnose MRSA infection nor to guide or monitor treatment for MRSA infections. Performed at [facility], [place] [address] [place], [place] [address]   Culture, respiratory (non-expectorated)     Status: None   Collection Time: 04/27/19 10:54 AM   Specimen: Tracheal Aspirate; Respiratory  Result Value Ref Range Status   Specimen Description   Final    TRACHEAL ASPIRATE Performed at [place] [address] [place], [place] [address]    Special Requests   Final    NONE Performed at [facility], [place] [address] , [place] [id number]    Gram Stain   Final    FEW WBC PRESENT, PREDOMINANTLY PMN FEW GRAM POSITIVE COCCI IN PAIRS RARE GRAM POSITIVE RODS    Culture   Final    Consistent with normal respiratory flora. Performed at [facility], [place] [address] [place], [place] [address]    Report Status 04/29/2019 FINAL  Final     Scheduled Meds: . aspirin  81 mg Per Tube Daily  . chlorhexidine  15 mL Mouth/Throat BID  . Chlorhexidine Gluconate Cloth  6 each Topical Daily  . cholecalciferol  1,000 Units Per  Tube Daily  . clonazepam  1 mg Per Tube  BID  . dexamethasone (DECADRON) injection  6 mg Intravenous Q24H  . enoxaparin (LOVENOX) injection  40 mg Subcutaneous Q24H  . feeding supplement (PRO-STAT SUGAR FREE 64)  60 mL Per Tube TID  . FLUoxetine  20 mg Per Tube Daily  . free water  100 mL Per Tube Q8H  . insulin aspart  0-9 Units Subcutaneous Q4H  . mouth rinse  15 mL Mouth Rinse 10 times per day  . pantoprazole sodium  40 mg Per Tube Daily  . polyethylene glycol  17 g Per Tube Daily  . QUEtiapine  50 mg Per Tube QHS  . tamsulosin  0.4 mg Oral BID  . vitamin C  500 mg Per Tube Daily  . zinc sulfate  220 mg Per Tube Daily   Continuous Infusions: . sodium chloride 10 mL/hr at 05/02/19 0600  . feeding supplement (VITAL 1.5 CAL) 1,000 mL (05/02/19 0600)  . fentaNYL infusion INTRAVENOUS 175 mcg/hr (05/02/19 0600)  . midazolam 2 mg/hr (05/02/19 0600)     LOS: 8 days   Cherene Altes, MD [facility] Office  [phone] Pager - Text Page per Amion  If 7PM-7AM, please contact night-coverage per Amion 05/02/2019, 8:09 AM

## 2019-05-02 NOTE — Progress Notes (Signed)
Assisted tele visit to patient with family members  .  Gottlieb Zuercher, RN. 

## 2019-05-02 NOTE — Progress Notes (Signed)
Nutrition Follow-up  DOCUMENTATION CODES:   Not applicable  INTERVENTION:   Increase Vital 1.5 to 45 ml/hr   Continue 60 ml Prostat TID  Provides: 2260 kcal, 162 grams protein, and 825 ml free water. Total free water: 1125 ml   NUTRITION DIAGNOSIS:   Increased nutrient needs related to (COVID PNA) as evidenced by estimated needs. Ongoing.   GOAL:   Patient will meet greater than or equal to 90% of their needs Progressing.   MONITOR:   TF tolerance, Vent status  REASON FOR ASSESSMENT:   Consult, Ventilator Enteral/tube feeding initiation and management  ASSESSMENT:   Pt with PMH of depression, HLD, and HTN who recently tested positive for COVID 10/8 now admitted with acute respiratory failure due to COVID+ PNA  Per staff pt is weaning on vent.  10/16 d/c'ed prone  Patient is currently intubated on ventilator support MV: 11.7 L/min Temp (24hrs), Avg:100.1 F (37.8 C), Min:98.6 F (37 C), Max:101.2 F (38.4 C)  Medications reviewed and include: vitamin D, decadron, miralax, vitamin c, zinc,  100 ml free water every 8 hours = 300 ml  IVF: NS @ 125 ml/hr Labs reviewed   TF: Vital 1.5 @ 40 ml/hr and 60 ml Prostat TID Provides: 2040 kcal, 154 grams protein, and 733 ml free water.   NUTRITION - FOCUSED PHYSICAL EXAM:  Deferred; RD working remotely   Diet Order:   Diet Order            Diet NPO time specified  Diet effective now              EDUCATION NEEDS:   No education needs have been identified at this time  Skin:  Skin Assessment: Reviewed RN Assessment  Last BM:  10/22  Height:   Ht Readings from Last 1 Encounters:  04/24/19 6' (1.829 m)    Weight:   Wt Readings from Last 1 Encounters:  05/02/19 50 kg    Ideal Body Weight:  80.9 kg  BMI:  Body mass index is 14.95 kg/m.  Estimated Nutritional Needs:   Kcal:  2263  Protein:  >147 grams  Fluid:  2 L/day  Maylon Peppers RD, LDN, CNSC [phone] Pager [phone] After Hours  Pager

## 2019-05-02 NOTE — Progress Notes (Signed)
Bladder scanned patient, 539mL. Patient has been I&O for 24 hours. Notified physician. Per physician orders foley cath has been placed.

## 2019-05-03 LAB — CBC WITH DIFFERENTIAL/PLATELET
Abs Immature Granulocytes: 0.36 10*3/uL — ABNORMAL HIGH (ref 0.00–0.07)
Basophils Absolute: 0 10*3/uL (ref 0.0–0.1)
Basophils Relative: 0 %
Eosinophils Absolute: 0 10*3/uL (ref 0.0–0.5)
Eosinophils Relative: 0 %
HCT: 36.9 % — ABNORMAL LOW (ref 39.0–52.0)
Hemoglobin: 12.1 g/dL — ABNORMAL LOW (ref 13.0–17.0)
Immature Granulocytes: 2 %
Lymphocytes Relative: 17 %
Lymphs Abs: 3 10*3/uL (ref 0.7–4.0)
MCH: 30 pg (ref 26.0–34.0)
MCHC: 32.8 g/dL (ref 30.0–36.0)
MCV: 91.3 fL (ref 80.0–100.0)
Monocytes Absolute: 1.2 10*3/uL — ABNORMAL HIGH (ref 0.1–1.0)
Monocytes Relative: 7 %
Neutro Abs: 13.2 10*3/uL — ABNORMAL HIGH (ref 1.7–7.7)
Neutrophils Relative %: 74 %
Platelets: 418 10*3/uL — ABNORMAL HIGH (ref 150–400)
RBC: 4.04 MIL/uL — ABNORMAL LOW (ref 4.22–5.81)
RDW: 14.7 % (ref 11.5–15.5)
WBC: 17.7 10*3/uL — ABNORMAL HIGH (ref 4.0–10.5)
nRBC: 0.1 % (ref 0.0–0.2)

## 2019-05-03 LAB — POCT I-STAT 7, (LYTES, BLD GAS, ICA,H+H)
Acid-Base Excess: 5 mmol/L — ABNORMAL HIGH (ref 0.0–2.0)
Bicarbonate: 28.6 mmol/L — ABNORMAL HIGH (ref 20.0–28.0)
Calcium, Ion: 1.16 mmol/L (ref 1.15–1.40)
HCT: 38 % — ABNORMAL LOW (ref 39.0–52.0)
Hemoglobin: 12.9 g/dL — ABNORMAL LOW (ref 13.0–17.0)
O2 Saturation: 94 %
Patient temperature: 99.6
Potassium: 3.7 mmol/L (ref 3.5–5.1)
Sodium: 143 mmol/L (ref 135–145)
TCO2: 30 mmol/L (ref 22–32)
pCO2 arterial: 38.5 mmHg (ref 32.0–48.0)
pH, Arterial: 7.481 — ABNORMAL HIGH (ref 7.350–7.450)
pO2, Arterial: 67 mmHg — ABNORMAL LOW (ref 83.0–108.0)

## 2019-05-03 LAB — PROTIME-INR
INR: 1.1 (ref 0.8–1.2)
Prothrombin Time: 13.7 seconds (ref 11.4–15.2)

## 2019-05-03 LAB — COMPREHENSIVE METABOLIC PANEL
ALT: 202 U/L — ABNORMAL HIGH (ref 0–44)
AST: 79 U/L — ABNORMAL HIGH (ref 15–41)
Albumin: 3.1 g/dL — ABNORMAL LOW (ref 3.5–5.0)
Alkaline Phosphatase: 70 U/L (ref 38–126)
Anion gap: 14 (ref 5–15)
BUN: 34 mg/dL — ABNORMAL HIGH (ref 8–23)
CO2: 26 mmol/L (ref 22–32)
Calcium: 9.2 mg/dL (ref 8.9–10.3)
Chloride: 102 mmol/L (ref 98–111)
Creatinine, Ser: 0.88 mg/dL (ref 0.61–1.24)
GFR calc Af Amer: >60 mL/min (ref >60)
GFR calc non Af Amer: >60 mL/min (ref >60)
Glucose, Bld: 159 mg/dL — ABNORMAL HIGH (ref 70–99)
Potassium: 3.9 mmol/L (ref 3.5–5.1)
Sodium: 142 mmol/L (ref 135–145)
Total Bilirubin: 0.5 mg/dL (ref 0.3–1.2)
Total Protein: 7.6 g/dL (ref 6.5–8.1)

## 2019-05-03 LAB — GLUCOSE, CAPILLARY
Glucose-Capillary: 145 mg/dL — ABNORMAL HIGH (ref 70–99)
Glucose-Capillary: 146 mg/dL — ABNORMAL HIGH (ref 70–99)
Glucose-Capillary: 155 mg/dL — ABNORMAL HIGH (ref 70–99)
Glucose-Capillary: 169 mg/dL — ABNORMAL HIGH (ref 70–99)
Glucose-Capillary: 170 mg/dL — ABNORMAL HIGH (ref 70–99)
Glucose-Capillary: 184 mg/dL — ABNORMAL HIGH (ref 70–99)

## 2019-05-03 LAB — C-REACTIVE PROTEIN: CRP: 1.5 mg/dL — ABNORMAL HIGH (ref <1.0)

## 2019-05-03 LAB — D-DIMER, QUANTITATIVE: D-Dimer, Quant: 2.08 ug/mL-FEU — ABNORMAL HIGH (ref 0.00–0.50)

## 2019-05-03 MED ORDER — ACETAMINOPHEN 325 MG PO TABS: 650.0000 mg | ORAL_TABLET | Freq: Four times a day (QID) | ORAL | Status: DC | PRN

## 2019-05-03 MED ORDER — QUETIAPINE FUMARATE 50 MG PO TABS
50.0000 mg | ORAL_TABLET | Freq: Every day | ORAL | Status: DC
  Administered 2019-05-03: 50 mg via ORAL
  Filled 2019-05-03: qty 1

## 2019-05-03 MED ORDER — HALOPERIDOL LACTATE 5 MG/ML IJ SOLN
2.0000 mg | Freq: Four times a day (QID) | INTRAMUSCULAR | Status: DC | PRN
  Administered 2019-05-03 – 2019-05-04 (×3): 5 mg via INTRAVENOUS
  Filled 2019-05-03 (×3): qty 1

## 2019-05-03 MED ORDER — LORAZEPAM 2 MG/ML IJ SOLN
1.0000 mg | INTRAMUSCULAR | Status: DC | PRN
  Administered 2019-05-03 – 2019-05-05 (×3): 2 mg via INTRAVENOUS
  Filled 2019-05-03 (×3): qty 1

## 2019-05-03 MED ORDER — INSULIN ASPART 100 UNIT/ML ~~LOC~~ SOLN
0.0000 [IU] | Freq: Every day | SUBCUTANEOUS | Status: DC
  Administered 2019-05-03: 2 [IU] via SUBCUTANEOUS

## 2019-05-03 MED ORDER — BETHANECHOL CHLORIDE 10 MG PO TABS
10.0000 mg | ORAL_TABLET | Freq: Four times a day (QID) | ORAL | Status: DC
  Administered 2019-05-03 – 2019-05-05 (×7): 10 mg via ORAL
  Filled 2019-05-03 (×12): qty 1

## 2019-05-03 MED ORDER — CLONIDINE HCL 0.1 MG PO TABS
0.1000 mg | ORAL_TABLET | Freq: Three times a day (TID) | ORAL | Status: DC
  Administered 2019-05-03 – 2019-05-08 (×14): 0.1 mg via ORAL
  Filled 2019-05-03 (×14): qty 1

## 2019-05-03 MED ORDER — PANTOPRAZOLE SODIUM 40 MG PO TBEC
40.0000 mg | DELAYED_RELEASE_TABLET | Freq: Every day | ORAL | Status: DC
  Administered 2019-05-04 – 2019-05-05 (×2): 40 mg via ORAL
  Filled 2019-05-03 (×2): qty 1

## 2019-05-03 MED ORDER — METOPROLOL TARTRATE 5 MG/5ML IV SOLN
5.0000 mg | Freq: Four times a day (QID) | INTRAVENOUS | Status: DC
  Administered 2019-05-03 – 2019-05-04 (×3): 5 mg via INTRAVENOUS
  Filled 2019-05-03 (×3): qty 5

## 2019-05-03 MED ORDER — INSULIN ASPART 100 UNIT/ML ~~LOC~~ SOLN
0.0000 [IU] | Freq: Three times a day (TID) | SUBCUTANEOUS | Status: DC
  Administered 2019-05-03: 18:00:00 2 [IU] via SUBCUTANEOUS
  Administered 2019-05-04: 1 [IU] via SUBCUTANEOUS
  Administered 2019-05-06: 13:00:00 2 [IU] via SUBCUTANEOUS

## 2019-05-03 MED ORDER — POLYETHYLENE GLYCOL 3350 17 G PO PACK: 17.0000 g | PACK | Freq: Every day | ORAL | Status: DC

## 2019-05-03 MED ORDER — ASPIRIN 81 MG PO CHEW
81.0000 mg | CHEWABLE_TABLET | Freq: Every day | ORAL | Status: DC
  Administered 2019-05-04 – 2019-05-08 (×5): 81 mg via ORAL
  Filled 2019-05-03 (×5): qty 1

## 2019-05-03 MED ORDER — FLUOXETINE HCL 20 MG/5ML PO SOLN
20.0000 mg | Freq: Every day | ORAL | Status: DC
  Filled 2019-05-03: qty 5

## 2019-05-03 NOTE — Evaluation (Signed)
Clinical/Bedside Swallow Evaluation Patient Details  Name: KADE MAZZIOTTI MRN: [id number] Date of Birth: Dec 07, 1955  Today's Date: 05/03/2019 Time: SLP Start Time (ACUTE ONLY): 51 SLP Stop Time (ACUTE ONLY): 1645 SLP Time Calculation (min) (ACUTE ONLY): 15 min  Past Medical History:  Past Medical History:  Diagnosis Date  . Allergic rhinitis   . BPH (benign prostatic hypertrophy)   . Depression   . Erectile dysfunction   . Hyperlipidemia   . Hypertension   . Internal hemorrhoids   . Tubular adenoma of colon 11/2008   Past Surgical History:  Past Surgical History:  Procedure Laterality Date  . APPENDECTOMY    . CARPAL TUNNEL RELEASE     right  . ELBOW SURGERY     right  . INGUINAL HERNIA REPAIR     age 21   HPI:  63 year old with a history of depression, HTN, HLD, and BPH who suffered the onset of cough and fevers 10/6.  He tested positive for Covid on 10/8.  He presented to the ED when he developed worsening shortness of breath and respiratory distress.  His oxygen saturation was in the 80s on room air and improved only to 89% on nonrebreather mask.  He was tachypneic in the 30s and 40s.  Chest x-ray confirmed multifocal bilateral infiltrates. ETT 10/14-23.    Assessment / Plan / Recommendation Clinical Impression  Pt presents with a functional swallow following nine-day oral intubation.  He is confused; able to follow commands.  Oral mechanism exam unremarkable; most of his teeth/molars are absent.  Voice is low volume but quality is clear, suggesting functional glottal closure. Pt passed three oz water test with no s/s of aspiration.  He consumed applesauce with f/u boluses of water with sufficient attention, oral manipulation, and the appearance of adequate airway protection.  Swallow/respiratory synchrony appears WFL. Recommend beginning a full liquid diet cautiously; give meds whole in applesauce. Sit upright for all POs; hold tray if coughing.  SLP will f/u on Monday,  10/26.  D/W RN.  SLP Visit Diagnosis: Dysphagia, unspecified (R13.10)    Aspiration Risk  Mild aspiration risk    Diet Recommendation   full liquids  Medication Administration: Whole meds with puree    Other  Recommendations Oral Care Recommendations: Oral care BID   Follow up Recommendations Other (comment)(tba)      Frequency and Duration min 3x week  2 weeks       Prognosis Prognosis for Safe Diet Advancement: Good      Swallow Study   General Date of Onset: 04/24/19 HPI: 63 year old with a history of depression, HTN, HLD, and BPH who suffered the onset of cough and fevers 10/6.  He tested positive for Covid on 10/8.  He presented to the ED when he developed worsening shortness of breath and respiratory distress.  His oxygen saturation was in the 80s on room air and improved only to 89% on nonrebreather mask.  He was tachypneic in the 30s and 40s.  Chest x-ray confirmed multifocal bilateral infiltrates. ETT 10/14-23.  Type of Study: Bedside Swallow Evaluation Previous Swallow Assessment: no Diet Prior to this Study: NPO Temperature Spikes Noted: No Respiratory Status: Nasal cannula History of Recent Intubation: Yes Length of Intubations (days): 9 days Date extubated: 05/03/19 Behavior/Cognition: Alert;Confused Oral Cavity Assessment: Within Functional Limits Oral Care Completed by SLP: Recent completion by staff Oral Cavity - Dentition: Missing dentition Self-Feeding Abilities: Total assist Patient Positioning: Upright in bed Baseline Vocal Quality: Low vocal intensity Volitional Cough: Weak  Volitional Swallow: Able to elicit    Oral/Motor/Sensory Function Overall Oral Motor/Sensory Function: Within functional limits   Ice Chips Ice chips: Within functional limits   Thin Liquid Thin Liquid: Within functional limits Presentation: Cup;Straw    Nectar Thick Nectar Thick Liquid: Not tested   Honey Thick Honey Thick Liquid: Not tested   Puree Puree: Within functional  limits   Solid     Solid: Not tested      [name] 05/03/2019,5:18 PM  Estill Bamberg L. Tivis Ringer, [place] Office number [phone]

## 2019-05-03 NOTE — Progress Notes (Signed)
PT Note  Patient Details Name: Dennis Moore MRN: [id number] DOB: 1955/08/17   Cancelled Treatment:    PT orders received.  Pt extubated earlier today.  If pt remains in the ICU over the weekend PT will evaluate on Monday.  If pt moves to progressive care this weekend PT and/or OT may evaluate.    Thanks,  Barbarann Ehlers. Olin Gurski, PT, DPT  Acute Rehabilitation [phone] pager [phone] office  @ [facility]: [phone]    Dennis Moore 05/03/2019, 3:40 PM

## 2019-05-03 NOTE — Progress Notes (Signed)
Coordinated with eLink to facilitate videochat with pts wife and daughter.

## 2019-05-03 NOTE — Progress Notes (Signed)
NAME:  Dennis Moore, MRN:  [id number], DOB:  March 24, 1956, LOS: 9 ADMISSION DATE:  04/24/2019, CONSULTATION DATE:  10/14 REFERRING MD:  Alcario Drought, CHIEF COMPLAINT:  Dyspnea   Brief History   63 year old male brought to the emergency room on 1014 with dyspnea cough fever and hypoxemia from COVID-19 pneumonia.  He tested positive on October 7.  He was intubated in the emergency room and transferred to [facility].  Past Medical History  BPH Depression Hyperlipidemia Hypertension  Significant Hospital Events   10/14 Admit, start decadron and remdesivir 10/15 enroll in [place] study; prone positioning; convalescent plasma; GPC in blood cx >> start vancomycin 10/16 d/c prone positioning 10/18 persistent fever >> start rocephin; Coag neg Staph in blood culture >> contaminate, d/c vancomcin 10/21 weaned on vent all day, confused  Consults:  PCCM  Procedures:  10/14 ETT>   Significant Diagnostic Tests:    Micro Data:  10/14 SARS COV2 >> POSITIVE 10/14 Blood >> Coag neg Staph > micrococcus 10/17 Sputum 10/17 >> oral flora  Antimicrobials/COVID Rx:  10/14 remdesivir > 10/19 10/14 decadron >  10/15 convalescent plasma  Levaquin 10/14  Vancomycin 10/15 > 10/18 Rocephin 10/18 >> 10/19  Interim history/subjective:   Weaning on pressure support this morning Follows commands  Objective   Blood pressure (!) 159/82, pulse (!) 120, temperature 99.9 F (37.7 C), temperature source Oral, resp. rate (!) 38, height 6' (1.829 m), weight 50 kg, SpO2 97 %.    Vent Mode: PSV;CPAP FiO2 (%):  [40 %] 40 % Set Rate:  [24 bmp] 24 bmp Vt Set:  [470 mL] 470 mL PEEP:  [8 cmH20] 8 cmH20 Pressure Support:  [8 cmH20] 8 cmH20 Plateau Pressure:  [12 cmH20-26 cmH20] 19 cmH20   Intake/Output Summary (Last 24 hours) at 05/03/2019 1158 Last data filed at 05/03/2019 0800 Gross per 24 hour  Intake 490 ml  Output 1350 ml  Net -860 ml   Filed Weights   04/28/19 0500 05/01/19 0500  05/02/19 0500  Weight: 93.3 kg 93.3 kg 50 kg    Examination:  General:  In bed on vent HENT: NCAT ETT in place PULM: CTA B, vent supported breathing CV: RRR, no mgr GI: BS+, soft, nontender MSK: normal bulk and tone Neuro: awake on ventilator, follows commands, can raise arms and lift head off pillow   Resolved Hospital Problem list     Assessment & Plan:  ARDS due to COVID-19 pneumonia: improving oxygenation, ventilatory mechanics are OK Extubate today Aspiration precautions SLP evaluation after extubation Administer O2 to maintain O2 saturation > 88%  Question healthcare associated pneumonia Continue to monitor off antibiotics  Need for sedation in setting of mechanical ventilation : resolved Stop sedation orders   Best practice:  Diet: tube feeding Pain/Anxiety/Delirium protocol (if indicated): yes, as above VAP protocol (if indicated): yes DVT prophylaxis: lovenox per COVID PACT trial GI prophylaxis: Pantoprazole for stress ulcer prophylaxis Glucose control: SSI Mobility: bed rest Code Status: FULL Family Communication: I updated Kathy by phone today Disposition: remain in ICU  Labs   CBC: Recent Labs  Lab 04/29/19 0534  04/30/19 0545 05/01/19 0525 05/02/19 0525 05/02/19 0530 05/03/19 0543  WBC 17.3*  --  15.6* 14.7* 16.8*  --  17.7*  NEUTROABS 12.6*  --  10.6* 10.2* 12.7*  --  13.2*  HGB 10.3*   < > 10.7* 10.8* 11.7* 11.9* 12.1*  HCT 31.9*   < > 33.1* 33.7* 36.2* 35.0* 36.9*  MCV 92.7  --  92.7 93.1 91.6  --  91.3  PLT 418*  --  474* 462* 465*  --  418*   < > = values in this interval not displayed.    Basic Metabolic Panel: Recent Labs  Lab 04/27/19 0553  04/28/19 0548  04/29/19 0534  04/30/19 0515 05/01/19 0525 05/02/19 0525 05/02/19 0530 05/03/19 0543  NA 150*   < > 151*   < > 150*   < > 149* 145 144 145 142  K 3.8   < > 3.8   < > 3.8   < > 4.2 4.4 3.8 3.9 3.9  CL 110  --  111  --  107  --  103 103 101  --  102  CO2 29  --  30  --   33*  --  33* 31 29  --  26  GLUCOSE 148*  --  122*  --  147*  --  146* 148* 137*  --  159*  BUN 30*  --  28*  --  31*  --  36* 33* 35*  --  34*  CREATININE 0.81  --  0.78  --  0.77  --  0.82 0.88 0.86  --  0.88  CALCIUM 8.8*  --  9.3  --  9.7  --  9.5 9.5 9.5  --  9.2  MG 2.0  --  2.0  --  2.0  --  2.2 2.2  --   --   --   PHOS 2.1*  --  2.6  --  3.8  --  5.3* 4.3  --   --   --    < > = values in this interval not displayed.   GFR: Estimated Creatinine Clearance: 60.8 mL/min (by C-G formula based on SCr of 0.88 mg/dL). Recent Labs  Lab 04/27/19 1800  04/30/19 0545 05/01/19 0525 05/02/19 0525 05/03/19 0543  PROCALCITON <0.10  --   --   --   --   --   WBC  --    < > 15.6* 14.7* 16.8* 17.7*  LATICACIDVEN 1.6  --   --   --   --   --    < > = values in this interval not displayed.    Liver Function Tests: Recent Labs  Lab 04/29/19 0534 04/30/19 0515 05/01/19 0525 05/02/19 0525 05/03/19 0543  AST 55* 118* 129* 104* 79*  ALT 63* 132* 173* 207* 202*  ALKPHOS 56 64 59 72 70  BILITOT 0.4 0.3 0.5 0.6 0.5  PROT 6.8 7.1 7.2 7.5 7.6  ALBUMIN 2.5* 2.6* 2.8* 3.0* 3.1*   No results for input(s): LIPASE, AMYLASE in the last 168 hours. No results for input(s): AMMONIA in the last 168 hours.  ABG    Component Value Date/Time   PHART 7.457 (H) 05/02/2019 0530   PCO2ART 46.9 05/02/2019 0530   PO2ART 87.0 05/02/2019 0530   HCO3 32.8 (H) 05/02/2019 0530   TCO2 34 (H) 05/02/2019 0530   ACIDBASEDEF 3.0 (H) 04/25/2019 0510   O2SAT 96.0 05/02/2019 0530     Coagulation Profile: Recent Labs  Lab 04/29/19 0534 04/30/19 0515 05/01/19 0525 05/02/19 0525 05/03/19 0543  INR 1.2 1.2 1.2 1.1 1.1    Cardiac Enzymes: No results for input(s): CKTOTAL, CKMB, CKMBINDEX, TROPONINI in the last 168 hours.  HbA1C: Hgb A1c MFr Bld  Date/Time Value Ref Range Status  05/02/2019 05:25 AM 6.7 (H) 4.8 - 5.6 % Final    Comment:    (NOTE) Pre  diabetes:          5.7%-6.4% Diabetes:               >6.4% Glycemic control for   <7.0% adults with diabetes   12/11/2012 11:10 AM 5.5 4.6 - 6.5 % Final    Comment:    Glycemic Control Guidelines for People with Diabetes:Non Diabetic:  <6%Goal of Therapy: <7%Additional Action Suggested:  >8%     CBG: Recent Labs  Lab 05/02/19 1939 05/02/19 2357 05/03/19 0502 05/03/19 0745 05/03/19 1128  GLUCAP 175* 170* 146* 155* 145*     Critical care time: 40 minutes       Roselie Awkward, MD [place] [facility] Pager: [phone] Cell: [phone] If no response, call [phone]

## 2019-05-03 NOTE — Progress Notes (Signed)
Dennis Moore  [id number] DOB: 1956-04-19 DOA: 04/24/2019 PCP: Ann Held, DO    Brief Narrative:  63 year old with a history of depression, HTN, HLD, and BPH who suffered the onset of cough and fevers 10/6.  He tested positive for Covid on 10/8.  He presented to the ED when he developed worsening shortness of breath and respiratory distress.  His oxygen saturation was in the 80s on room air and improved only to 89% on nonrebreather mask.  He was tachypneic in the 30s and 40s.  Chest x-ray confirmed multifocal bilateral infiltrates.  Significant Events: 10/6 symptom onset 10/8 Covid positive 10/14 admit via [place] - intubated 10/15 enrolled in Covid PACT study  COVID-19 specific Treatment: Decadron 10/14 > 23 Remdesivir 10/14 > 10/18 Convalescent plasma 10/15  Subjective: Patient had a low-grade fever earlier this morning at 101.1.  He is much more alert and interacts with examiner.  He is tolerating pressure support.  Plan is to attempt extubation today.  Some mild agitation but no evidence of severe distress.  Assessment & Plan:  Covid pneumonia - ARDS - acute hypoxic and hypercapnic respiratory failure Ventilator management per PCCM -has completed a course of remdesivir - was dosed with convalescent plasma - Decadron dosing continues today -extubate today and follow  Recent Labs  Lab 04/27/19 0553 04/27/19 1800 04/28/19 0548 04/29/19 0534 04/30/19 0515 05/01/19 0525 05/01/19 0535 05/02/19 0525 05/03/19 0525 05/03/19 0543  DDIMER 1.54*  --  1.89* 1.44*  --  1.16*  --  1.62*  --   --   CRP 8.1*  --  15.2* 14.2*  --   --  4.9* 2.7* 1.5*  --   ALT 46*  --  50* 63* 132* 173*  --  207*  --  202*  PROCALCITON  --  <0.10  --   --   --   --   --   --   --   --     Hypernatremia Corrected with free water  Recent Labs  Lab 05/01/19 0525 05/02/19 0525 05/02/19 0530 05/03/19 0543 05/03/19 1424  NA 145 144 145 142 [id number]    Toxic metabolic  encephalopathy Monitor mental status with continued weaning of sedation  Mild transaminitis Likely due to Covid itself versus remdesivir -follow trend -hold statin for now  Recent Labs  Lab 04/29/19 0534 04/30/19 0515 05/01/19 0525 05/02/19 0525 05/03/19 0543  AST 55* 118* 129* 104* 79*  ALT 63* 132* 173* 207* 202*  ALKPHOS 56 64 59 72 70  BILITOT 0.4 0.3 0.5 0.6 0.5  PROT 6.8 7.1 7.2 7.5 7.6  ALBUMIN 2.5* 2.6* 2.8* 3.0* 3.1*    HTN With agitation the patient's blood pressure is climbing -treat agitation and also add antihypertensive   HLD Holding statin in the setting of mild transaminitis  Steroid-induced hyperglycemia - newly diagnosed diabetes Continue sliding scale insulin - A1c 6.7 therefore technically meets criteria for diabetes -before making formal diagnosis will suggest follow-up in outpatient setting after complete recovery from Covid  BPH Having symptoms of intermittent retention -continue Flomax and Urecholine  Nutrition Continue tube feeding as per nutrition until improved to the point of tolerating oral intake  DVT prophylaxis: COVID PACT study Code Status: FULL CODE Family Communication:  Disposition Plan: ICU -anticipate extubation today  Consultants:  PCCM  Antimicrobials:  Levaquin 10/14 Vancomycin 10/15 > 10/18 Rocephin 10/18 > 10/19  Objective: Blood pressure (!) 159/82, pulse (!) 120, temperature (!) 101.1 F (38.4 C), temperature  source Oral, resp. rate (!) 38, height 6' (1.829 m), weight 50 kg, SpO2 97 %.  Intake/Output Summary (Last 24 hours) at 05/03/2019 0808 Last data filed at 05/03/2019 0600 Gross per 24 hour  Intake 851.82 ml  Output 1950 ml  Net -1098.18 ml   Filed Weights   04/28/19 0500 05/01/19 0500 05/02/19 0500  Weight: 93.3 kg 93.3 kg 50 kg    Examination: General: No acute distress -mildly agitated Lungs: Faint diffuse crackles without change Cardiovascular: Mild tachycardia without murmur Abdomen: NT/ND,  soft, BS positive Extremities: No significant edema bilateral lower extremities  CBC: Recent Labs  Lab 05/01/19 0525 05/02/19 0525 05/02/19 0530 05/03/19 0543  WBC 14.7* 16.8*  --  17.7*  NEUTROABS 10.2* 12.7*  --  13.2*  HGB 10.8* 11.7* 11.9* 12.1*  HCT 33.7* 36.2* 35.0* 36.9*  MCV 93.1 91.6  --  91.3  PLT 462* 465*  --  [id number]*   Basic Metabolic Panel: Recent Labs  Lab 04/29/19 0534  04/30/19 0515 05/01/19 0525 05/02/19 0525 05/02/19 0530 05/03/19 0543  NA 150*   < > 149* 145 144 145 142  K 3.8   < > 4.2 4.4 3.8 3.9 3.9  CL 107  --  103 103 101  --  102  CO2 33*  --  33* 31 29  --  26  GLUCOSE 147*  --  146* 148* 137*  --  159*  BUN 31*  --  36* 33* 35*  --  34*  CREATININE 0.77  --  0.82 0.88 0.86  --  0.88  CALCIUM 9.7  --  9.5 9.5 9.5  --  9.2  MG 2.0  --  2.2 2.2  --   --   --   PHOS 3.8  --  5.3* 4.3  --   --   --    < > = values in this interval not displayed.   GFR: Estimated Creatinine Clearance: 60.8 mL/min (by C-G formula based on SCr of 0.88 mg/dL).  Liver Function Tests: Recent Labs  Lab 04/30/19 0515 05/01/19 0525 05/02/19 0525 05/03/19 0543  AST 118* 129* 104* 79*  ALT 132* 173* 207* 202*  ALKPHOS 64 59 72 70  BILITOT 0.3 0.5 0.6 0.5  PROT 7.1 7.2 7.5 7.6  ALBUMIN 2.6* 2.8* 3.0* 3.1*    Coagulation Profile: Recent Labs  Lab 04/28/19 0548 04/29/19 0534 04/30/19 0515 05/01/19 0525 05/02/19 0525  INR 1.1 1.2 1.2 1.2 1.1    HbA1C: Hgb A1c MFr Bld  Date/Time Value Ref Range Status  05/02/2019 05:25 AM 6.7 (H) 4.8 - 5.6 % Final    Comment:    (NOTE) Pre diabetes:          5.7%-6.4% Diabetes:              >6.4% Glycemic control for   <7.0% adults with diabetes   12/11/2012 11:10 AM 5.5 4.6 - 6.5 % Final    Comment:    Glycemic Control Guidelines for People with Diabetes:Non Diabetic:  <6%Goal of Therapy: <7%Additional Action Suggested:  >8%     CBG: Recent Labs  Lab 05/02/19 1559 05/02/19 1939 05/02/19 2357 05/03/19 0502  05/03/19 0745  GLUCAP 209* 175* 170* 146* 155*    Recent Results (from the past 240 hour(s))  SARS CORONAVIRUS 2 (TAT 6-24 HRS)     Status: Abnormal   Collection Time: 04/24/19  7:06 PM  Result Value Ref Range Status   SARS Coronavirus 2 POSITIVE (A) NEGATIVE Final  Comment: RESULT CALLED TO, READ BACK BY AND VERIFIED WITH: RN H BILLITER @ [phone] 04/25/19 BY S GEZAHEGN (NOTE) SARS-CoV-2 target nucleic acids are DETECTED. The SARS-CoV-2 RNA is generally detectable in upper and lower respiratory specimens during the acute phase of infection. Positive results are indicative of active infection with SARS-CoV-2. Clinical  correlation with patient history and other diagnostic information is necessary to determine patient infection status. Positive results do  not rule out bacterial infection or co-infection with other viruses. The expected result is Negative. Fact Sheet for Patients: SugarRoll.be Fact Sheet for Healthcare Providers: https://www.woods-mathews.com/ This test is not yet approved or cleared by the [place] FDA and  has been authorized for detection and/or diagnosis of SARS-CoV-2 by FDA under an Emergency Use Authorization (EUA). This EUA will remain  in effect (meaning this test can be use d) for the duration of the COVID-19 declaration under Section 564(b)(1) of the Act, 21 U.S.C. section 360bbb-3(b)(1), unless the authorization is terminated or revoked sooner. Performed at [facility], [place] [address], [place], [place] [address]   Culture, blood (single) w Reflex to ID Panel     Status: Abnormal   Collection Time: 04/24/19 10:00 PM   Specimen: BLOOD LEFT FOREARM  Result Value Ref Range Status   Specimen Description BLOOD LEFT FOREARM  Final   Special Requests   Final    BOTTLES DRAWN AEROBIC AND ANAEROBIC Blood Culture adequate volume   Culture  Setup Time   Final    IN BOTH AEROBIC AND ANAEROBIC BOTTLES GRAM  POSITIVE COCCI CRITICAL RESULT CALLED TO, READ BACK BY AND VERIFIED WITH: N GLOGOVAC PHARMD 04/25/19 2230 JDW    Culture (A)  Final    STAPHYLOCOCCUS SPECIES (COAGULASE NEGATIVE) THE SIGNIFICANCE OF ISOLATING THIS ORGANISM FROM A SINGLE SET OF BLOOD CULTURES WHEN MULTIPLE SETS ARE DRAWN IS UNCERTAIN. PLEASE NOTIFY THE MICROBIOLOGY DEPARTMENT WITHIN ONE WEEK IF SPECIATION AND SENSITIVITIES ARE REQUIRED. MICROCOCCUS SPECIES Standardized susceptibility testing for this organism is not available. Performed at [facility], [place] [address], [place], [place] [address]    Report Status 04/27/2019 FINAL  Final  MRSA PCR Screening     Status: None   Collection Time: 04/26/19  1:00 PM   Specimen: Nasopharyngeal  Result Value Ref Range Status   MRSA by PCR NEGATIVE NEGATIVE Final    Comment:        The GeneXpert MRSA Assay (FDA approved for NASAL specimens only), is one component of a comprehensive MRSA colonization surveillance program. It is not intended to diagnose MRSA infection nor to guide or monitor treatment for MRSA infections. Performed at [facility], [place] [address], [place], [place] [address]   Culture, respiratory (non-expectorated)     Status: None   Collection Time: 04/27/19 10:54 AM   Specimen: Tracheal Aspirate; Respiratory  Result Value Ref Range Status   Specimen Description   Final    TRACHEAL ASPIRATE Performed at [place] [address] [place], [place] [address]    Special Requests   Final    NONE Performed at [facility], [place] [address] [place], [place] [address]    Gram Stain   Final    FEW WBC PRESENT, PREDOMINANTLY PMN FEW GRAM POSITIVE COCCI IN PAIRS RARE GRAM POSITIVE RODS    Culture   Final    Consistent with normal respiratory flora. Performed at [facility], [place] [address] [place], [place] [address]  Report Status 04/29/2019 FINAL  Final     Scheduled Meds: .  aspirin  81 mg Per Tube Daily  . bethanechol  10 mg Per Tube QID  . chlorhexidine  15 mL Mouth/Throat BID  . Chlorhexidine Gluconate Cloth  6 each Topical Daily  . cholecalciferol  1,000 Units Per Tube Daily  . dexamethasone (DECADRON) injection  6 mg Intravenous Q24H  . enoxaparin (LOVENOX) injection  40 mg Subcutaneous Q24H  . feeding supplement (PRO-STAT SUGAR FREE 64)  60 mL Per Tube TID  . FLUoxetine  20 mg Per Tube Daily  . free water  100 mL Per Tube Q8H  . insulin aspart  0-9 Units Subcutaneous Q4H  . mouth rinse  15 mL Mouth Rinse 10 times per day  . pantoprazole sodium  40 mg Per Tube Daily  . polyethylene glycol  17 g Per Tube Daily  . QUEtiapine  50 mg Per Tube QHS  . simethicone  40 mg Oral QID  . tamsulosin  0.4 mg Oral BID  . vitamin C  500 mg Per Tube Daily  . zinc sulfate  220 mg Per Tube Daily   Continuous Infusions: . sodium chloride Stopped (05/02/19 1005)  . feeding supplement (VITAL 1.5 CAL) 1,000 mL (05/03/19 0137)     LOS: 9 days   Cherene Altes, MD [facility] Office  [phone] Pager - Text Page per Amion  If 7PM-7AM, please contact night-coverage per Amion 05/03/2019, 8:08 AM

## 2019-05-03 NOTE — Progress Notes (Signed)
Spoke with pts daughter on phone.  Updated on pt status.  Doing well after extubation today.  Requiring minimal oxygen.  Passed swallow exam and will be able to have full liquid diet for dinner.  All questions answered.

## 2019-05-03 NOTE — Procedures (Signed)
Extubation Procedure Note  Patient Details:   Name: TATEUM ELVIDGE DOB: 1956-07-09 MRN: [id number]   Airway Documentation:  Airway 7.5 mm (Active)  Secured at (cm) 24 cm 05/03/19 0759  Measured From Lips 05/03/19 0759  Secured Location Right 05/03/19 0759  Secured By [employer] 05/03/19 0759  Tube Holder Repositioned Yes 05/03/19 0759  Cuff Pressure (cm H2O) 28 cm H2O 05/01/19 2005  Site Condition Dry 05/03/19 0759  Date Prophylactic Dressing Applied (if applicable) 123XX123 [id number] 1530   Vent end date: (not recorded) Vent end time: (not recorded)   Evaluation  O2 sats: stable throughout Complications: No apparent complications Patient did tolerate procedure well. Bilateral Breath Sounds: Diminished   Yes   Leak text positive.  No stridor noted.  Elfreda Blanchet 05/03/2019, 12:57 PM

## 2019-05-04 LAB — GLUCOSE, CAPILLARY
Glucose-Capillary: 108 mg/dL — ABNORMAL HIGH (ref 70–99)
Glucose-Capillary: 108 mg/dL — ABNORMAL HIGH (ref 70–99)
Glucose-Capillary: 131 mg/dL — ABNORMAL HIGH (ref 70–99)
Glucose-Capillary: 122 mg/dL — ABNORMAL HIGH (ref 70–99)

## 2019-05-04 LAB — COMPREHENSIVE METABOLIC PANEL
ALT: 169 U/L — ABNORMAL HIGH (ref 0–44)
AST: 62 U/L — ABNORMAL HIGH (ref 15–41)
Albumin: 3.1 g/dL — ABNORMAL LOW (ref 3.5–5.0)
Alkaline Phosphatase: 58 U/L (ref 38–126)
Anion gap: 11 (ref 5–15)
BUN: 32 mg/dL — ABNORMAL HIGH (ref 8–23)
CO2: 27 mmol/L (ref 22–32)
Calcium: 8.9 mg/dL (ref 8.9–10.3)
Chloride: 104 mmol/L (ref 98–111)
Creatinine, Ser: 0.77 mg/dL (ref 0.61–1.24)
GFR calc Af Amer: >60 mL/min (ref >60)
GFR calc non Af Amer: >60 mL/min (ref >60)
Glucose, Bld: 125 mg/dL — ABNORMAL HIGH (ref 70–99)
Potassium: 3.3 mmol/L — ABNORMAL LOW (ref 3.5–5.1)
Sodium: 142 mmol/L (ref 135–145)
Total Bilirubin: 1 mg/dL (ref 0.3–1.2)
Total Protein: 7.1 g/dL (ref 6.5–8.1)

## 2019-05-04 LAB — CBC
HCT: 34.4 % — ABNORMAL LOW (ref 39.0–52.0)
Hemoglobin: 11.3 g/dL — ABNORMAL LOW (ref 13.0–17.0)
MCH: 29.8 pg (ref 26.0–34.0)
MCHC: 32.8 g/dL (ref 30.0–36.0)
MCV: 90.8 fL (ref 80.0–100.0)
Platelets: 389 10*3/uL (ref 150–400)
RBC: 3.79 MIL/uL — ABNORMAL LOW (ref 4.22–5.81)
RDW: 14.6 % (ref 11.5–15.5)
WBC: 15.5 10*3/uL — ABNORMAL HIGH (ref 4.0–10.5)
nRBC: 0 % (ref 0.0–0.2)

## 2019-05-04 LAB — PROTIME-INR
INR: 1.1 (ref 0.8–1.2)
Prothrombin Time: 13.7 seconds (ref 11.4–15.2)

## 2019-05-04 MED ORDER — FLUOXETINE HCL 20 MG PO CAPS
20.0000 mg | ORAL_CAPSULE | Freq: Every day | ORAL | Status: DC
  Administered 2019-05-04 – 2019-05-08 (×5): 20 mg via ORAL
  Filled 2019-05-04 (×7): qty 1

## 2019-05-04 MED ORDER — POTASSIUM CHLORIDE 20 MEQ/15ML (10%) PO SOLN
40.0000 meq | Freq: Once | ORAL | Status: AC
  Administered 2019-05-04: 40 meq via ORAL
  Filled 2019-05-04: qty 30

## 2019-05-04 MED ORDER — QUETIAPINE FUMARATE 25 MG PO TABS
25.0000 mg | ORAL_TABLET | Freq: Every day | ORAL | Status: DC
  Administered 2019-05-04: 25 mg via ORAL
  Filled 2019-05-04: qty 1

## 2019-05-04 MED ORDER — HALOPERIDOL LACTATE 5 MG/ML IJ SOLN
2.0000 mg | Freq: Four times a day (QID) | INTRAMUSCULAR | Status: DC | PRN
  Administered 2019-05-04: 22:00:00 2 mg via INTRAVENOUS
  Filled 2019-05-04 (×2): qty 1

## 2019-05-04 NOTE — Progress Notes (Signed)
NAME:  Dennis Moore, MRN:  [id number], DOB:  1955/08/28, LOS: 78 ADMISSION DATE:  04/24/2019, CONSULTATION DATE:  10/14 REFERRING MD:  Alcario Drought, CHIEF COMPLAINT:  Dyspnea   Brief History   63 year old male brought to the emergency room on 1014 with dyspnea cough fever and hypoxemia from COVID-19 pneumonia.  He tested positive on October 7.  He was intubated in the emergency room and transferred to [facility].  Past Medical History  BPH Depression Hyperlipidemia Hypertension  Significant Hospital Events   10/14 Admit, start decadron and remdesivir 10/15 enroll in [place] study; prone positioning; convalescent plasma; GPC in blood cx >> start vancomycin 10/16 d/c prone positioning 10/18 persistent fever >> start rocephin; Coag neg Staph in blood culture >> contaminate, d/c vancomcin 10/21 weaned on vent all day, confused 10/24 extubated  Consults:  PCCM  Procedures:  10/14 ETT>   Significant Diagnostic Tests:    Micro Data:  10/14 SARS COV2 >> POSITIVE 10/14 Blood >> Coag neg Staph > micrococcus 10/17 Sputum 10/17 >> oral flora  Antimicrobials/COVID Rx:  10/14 remdesivir > 10/19 10/14 decadron >  10/15 convalescent plasma  Levaquin 10/14  Vancomycin 10/15 > 10/18 Rocephin 10/18 >> 10/19  Interim history/subjective:   Extubated yesterday Doing OK Passed SLP evaluation   Objective   Blood pressure 123/75, pulse 64, temperature (!) 97.4 F (36.3 C), temperature source Axillary, resp. rate 16, height 6' (1.829 m), weight 50 kg, SpO2 93 %.    Vent Mode: PSV;CPAP FiO2 (%):  [40 %] 40 % Set Rate:  [24 bmp] 24 bmp Vt Set:  [470 mL] 470 mL PEEP:  [8 cmH20] 8 cmH20 Pressure Support:  [8 cmH20] 8 cmH20 Plateau Pressure:  [19 cmH20] 19 cmH20   Intake/Output Summary (Last 24 hours) at 05/04/2019 0748 Last data filed at 05/04/2019 0600 Gross per 24 hour  Intake 1687.5 ml  Output 1521 ml  Net 166.5 ml   Filed Weights   04/28/19 0500 05/01/19  0500 05/02/19 0500  Weight: 93.3 kg 93.3 kg 50 kg    Examination:  General:  Resting comfortably in bed HENT: NCAT OP clear PULM: CTA B, normal effort CV: RRR, no mgr GI: BS+, soft, nontender MSK: normal bulk and tone Neuro: awake, alert, confused, [place] [facility] Problem list     Assessment & Plan:  ARDS due to COVID-19 pneumonia: improving oxygenation, ventilatory mechanics are OK Wean off O2 to maintain O2 saturation > 85% Monitor in ICU setting today Out of bed If mental status improves and strength improving then can move to PCU  Physical deconditioning PT consult OT consult Out of bed today To chair   Best practice:  Diet: tube feeding Pain/Anxiety/Delirium protocol (if indicated): yes, as above VAP protocol (if indicated): yes DVT prophylaxis: lovenox per COVID PACT trial GI prophylaxis: Pantoprazole for stress ulcer prophylaxis Glucose control: SSI Mobility: bed rest Code Status: FULL Family Communication: I updated Kathy by phone today Disposition: remain in ICU  Labs   CBC: Recent Labs  Lab 04/29/19 0534  04/30/19 0545 05/01/19 0525 05/02/19 0525 05/02/19 0530 05/03/19 0543 05/03/19 1424 05/04/19 0425  WBC 17.3*  --  15.6* 14.7* 16.8*  --  17.7*  --  15.5*  NEUTROABS 12.6*  --  10.6* 10.2* 12.7*  --  13.2*  --   --   HGB 10.3*   < > 10.7* 10.8* 11.7* 11.9* 12.1* 12.9* 11.3*  HCT 31.9*   < > 33.1* 33.7* 36.2* 35.0*  36.9* 38.0* 34.4*  MCV 92.7  --  92.7 93.1 91.6  --  91.3  --  90.8  PLT 418*  --  474* 462* 465*  --  418*  --  389   < > = values in this interval not displayed.    Basic Metabolic Panel: Recent Labs  Lab 04/28/19 0548  04/29/19 0534  04/30/19 0515 05/01/19 0525 05/02/19 0525 05/02/19 0530 05/03/19 0543 05/03/19 1424 05/04/19 0425  NA 151*   < > 150*   < > 149* 145 144 145 142 143 142  K 3.8   < > 3.8   < > 4.2 4.4 3.8 3.9 3.9 3.7 3.3*  CL 111  --  107  --  103 103 101  --  102  --  104  CO2 30  --  33*   --  33* 31 29  --  26  --  27  GLUCOSE 122*  --  147*  --  146* 148* 137*  --  159*  --  125*  BUN 28*  --  31*  --  36* 33* 35*  --  34*  --  32*  CREATININE 0.78  --  0.77  --  0.82 0.88 0.86  --  0.88  --  0.77  CALCIUM 9.3  --  9.7  --  9.5 9.5 9.5  --  9.2  --  8.9  MG 2.0  --  2.0  --  2.2 2.2  --   --   --   --   --   PHOS 2.6  --  3.8  --  5.3* 4.3  --   --   --   --   --    < > = values in this interval not displayed.   GFR: Estimated Creatinine Clearance: 66.8 mL/min (by C-G formula based on SCr of 0.77 mg/dL). Recent Labs  Lab 04/27/19 1800  05/01/19 0525 05/02/19 0525 05/03/19 0543 05/04/19 0425  PROCALCITON <0.10  --   --   --   --   --   WBC  --    < > 14.7* 16.8* 17.7* 15.5*  LATICACIDVEN 1.6  --   --   --   --   --    < > = values in this interval not displayed.    Liver Function Tests: Recent Labs  Lab 04/30/19 0515 05/01/19 0525 05/02/19 0525 05/03/19 0543 05/04/19 0425  AST 118* 129* 104* 79* 62*  ALT 132* 173* 207* 202* 169*  ALKPHOS 64 59 72 70 58  BILITOT 0.3 0.5 0.6 0.5 1.0  PROT 7.1 7.2 7.5 7.6 7.1  ALBUMIN 2.6* 2.8* 3.0* 3.1* 3.1*   No results for input(s): LIPASE, AMYLASE in the last 168 hours. No results for input(s): AMMONIA in the last 168 hours.  ABG    Component Value Date/Time   PHART 7.481 (H) 05/03/2019 1424   PCO2ART 38.5 05/03/2019 1424   PO2ART 67.0 (L) 05/03/2019 1424   HCO3 28.6 (H) 05/03/2019 1424   TCO2 30 05/03/2019 1424   ACIDBASEDEF 3.0 (H) 04/25/2019 0510   O2SAT 94.0 05/03/2019 1424     Coagulation Profile: Recent Labs  Lab 04/30/19 0515 05/01/19 0525 05/02/19 0525 05/03/19 0543 05/04/19 0425  INR 1.2 1.2 1.1 1.1 1.1    Cardiac Enzymes: No results for input(s): CKTOTAL, CKMB, CKMBINDEX, TROPONINI in the last 168 hours.  HbA1C: Hgb A1c MFr Bld  Date/Time Value Ref Range Status  05/02/2019 05:25 AM  6.7 (H) 4.8 - 5.6 % Final    Comment:    (NOTE) Pre diabetes:          5.7%-6.4% Diabetes:               >6.4% Glycemic control for   <7.0% adults with diabetes   12/11/2012 11:10 AM 5.5 4.6 - 6.5 % Final    Comment:    Glycemic Control Guidelines for People with Diabetes:Non Diabetic:  <6%Goal of Therapy: <7%Additional Action Suggested:  >8%     CBG: Recent Labs  Lab 05/03/19 0745 05/03/19 1128 05/03/19 1730 05/03/19 2142 05/04/19 0729  GLUCAP 155* 145* 184* 169* 108*     Critical care time: n/a      Roselie Awkward, MD [place] [facility] Pager: [phone] Cell: [phone] If no response, call [phone]

## 2019-05-04 NOTE — Progress Notes (Signed)
Today at approximately 1500 the primary RN left the floor for a brief bathroom break and requested that the surrounding nursing team listen for her patients. Per RN the patient was resting comfortably in bed prior to her doffing her PPE. Reportedly 5 minutes later the patient rolled out of bed and landed on the floor. The patient was immediately attended to by the nursing staff. The patient appeared unharmed by the fall, no obvious injuries were noted. Dr. Joette Catching was notified immediately and restraints were implemented, per order. Post-fall a low-bed and floor mats were ordered, updated the patient's safety status to High Fall Risk. Family was contacted by primary RN and responded well. All questions welcomed and answered. No further action taken at this time.

## 2019-05-04 NOTE — Progress Notes (Signed)
Dennis Moore  [id number] DOB: Sep 18, 1955 DOA: 04/24/2019 PCP: Ann Held, DO    Brief Narrative:  63 year old with a history of depression, HTN, HLD, and BPH who suffered the onset of cough and fevers 10/6.  He tested positive for Covid on 10/8.  He presented to the ED when he developed worsening shortness of breath and respiratory distress.  His oxygen saturation was in the 80s on room air and improved only to 89% on nonrebreather mask.  He was tachypneic in the 30s and 40s.  Chest x-ray confirmed multifocal bilateral infiltrates.  Significant Events: 10/6 symptom onset 10/8 Covid positive 10/14 admit via [place] - intubated 10/15 enrolled in Covid PACT study 10/23 extubated  COVID-19 specific Treatment: Decadron 10/14 > 23 Remdesivir 10/14 > 10/18 Convalescent plasma 10/15  Subjective: Extubated.  Mildly confused but interactive.  Respirations appear comfortable.  Denies chest pain shortness of breath nausea vomiting or abdominal pain.  Reports a poor appetite.  Assessment & Plan:  Covid pneumonia - ARDS - acute hypoxic and hypercapnic respiratory failure Successfully extubated 10/23 - has completed a course of remdesivir - was dosed with convalescent plasma - Decadron course has been completed -monitor in ICU overnight with potential for transfer to PCU 10/25 if remains stable  Hypernatremia Corrected with free water  Hypokalemia  Supplement and follow -check magnesium in a.m.  Toxic metabolic encephalopathy Monitor mental status with continued weaning of sedation -much improved this morning  Mild transaminitis Likely due to Covid itself versus remdesivir - follow trend - holding statin for now  Recent Labs  Lab 04/30/19 0515 05/01/19 0525 05/02/19 0525 05/03/19 0543 05/04/19 0425  AST 118* 129* 104* 79* 62*  ALT 132* 173* 207* 202* 169*  ALKPHOS 64 59 72 70 58  BILITOT 0.3 0.5 0.6 0.5 1.0  PROT 7.1 7.2 7.5 7.6 7.1  ALBUMIN 2.6* 2.8* 3.0*  3.1* 3.1*    HTN Patient has required upward titration of blood pressure meds over the last 24 hours -blood pressure now stable -follow trend without change today  HLD Holding statin in the setting of mild transaminitis  Steroid-induced hyperglycemia - newly diagnosed diabetes Continue sliding scale insulin - A1c 6.7 therefore technically meets criteria for diabetes -before making formal diagnosis will suggest follow-up in outpatient setting after complete recovery from Covid -CBG presently well controlled  BPH Having symptoms of intermittent retention - continue Flomax and Urecholine  Nutrition Feeding tube output post extubation -cleared for full liquid diet by SLP post extubation -advance diet as able  Coag negative staph in blood culture Felt to be simple contaminant -no clinical evidence of active infection presently  DVT prophylaxis: COVID PACT study Code Status: FULL CODE Family Communication:  Disposition Plan: ICU - probable tx to PCU in AM   Consultants:  PCCM  Antimicrobials:  Levaquin 10/14 Vancomycin 10/15 > 10/18 Rocephin 10/18 > 10/19  Objective: Blood pressure 123/75, pulse 64, temperature (!) 97.4 F (36.3 C), temperature source Axillary, resp. rate 16, height 6' (1.829 m), weight 50 kg, SpO2 93 %.  Intake/Output Summary (Last 24 hours) at 05/04/2019 0751 Last data filed at 05/04/2019 0600 Gross per 24 hour  Intake 1687.5 ml  Output 1521 ml  Net 166.5 ml   Filed Weights   04/28/19 0500 05/01/19 0500 05/02/19 0500  Weight: 93.3 kg 93.3 kg 50 kg    Examination: General: No acute distress -alert and calm Lungs: Poor air movement bilateral bases with good air movement throughout other fields  with no wheezing Cardiovascular: Regular rate and rhythm without murmur or rub Abdomen: NT/ND, soft, BS positive Extremities: No significant edema B LE   CBC: Recent Labs  Lab 05/01/19 0525 05/02/19 0525  05/03/19 0543 05/03/19 1424 05/04/19 0425  WBC  14.7* 16.8*  --  17.7*  --  15.5*  NEUTROABS 10.2* 12.7*  --  13.2*  --   --   HGB 10.8* 11.7*   < > 12.1* 12.9* 11.3*  HCT 33.7* 36.2*   < > 36.9* 38.0* 34.4*  MCV 93.1 91.6  --  91.3  --  90.8  PLT 462* 465*  --  418*  --  389   < > = values in this interval not displayed.   Basic Metabolic Panel: Recent Labs  Lab 04/29/19 0534  04/30/19 0515 05/01/19 0525 05/02/19 0525  05/03/19 0543 05/03/19 1424 05/04/19 0425  NA 150*   < > 149* 145 144   < > 142 143 142  K 3.8   < > 4.2 4.4 3.8   < > 3.9 3.7 3.3*  CL 107  --  103 103 101  --  102  --  104  CO2 33*  --  33* 31 29  --  26  --  27  GLUCOSE 147*  --  146* 148* 137*  --  159*  --  125*  BUN 31*  --  36* 33* 35*  --  34*  --  32*  CREATININE 0.77  --  0.82 0.88 0.86  --  0.88  --  0.77  CALCIUM 9.7  --  9.5 9.5 9.5  --  9.2  --  8.9  MG 2.0  --  2.2 2.2  --   --   --   --   --   PHOS 3.8  --  5.3* 4.3  --   --   --   --   --    < > = values in this interval not displayed.   GFR: Estimated Creatinine Clearance: 66.8 mL/min (by C-G formula based on SCr of 0.77 mg/dL).  Liver Function Tests: Recent Labs  Lab 05/01/19 0525 05/02/19 0525 05/03/19 0543 05/04/19 0425  AST 129* 104* 79* 62*  ALT 173* 207* 202* 169*  ALKPHOS 59 72 70 58  BILITOT 0.5 0.6 0.5 1.0  PROT 7.2 7.5 7.6 7.1  ALBUMIN 2.8* 3.0* 3.1* 3.1*    Coagulation Profile: Recent Labs  Lab 04/30/19 0515 05/01/19 0525 05/02/19 0525 05/03/19 0543 05/04/19 0425  INR 1.2 1.2 1.1 1.1 1.1    HbA1C: Hgb A1c MFr Bld  Date/Time Value Ref Range Status  05/02/2019 05:25 AM 6.7 (H) 4.8 - 5.6 % Final    Comment:    (NOTE) Pre diabetes:          5.7%-6.4% Diabetes:              >6.4% Glycemic control for   <7.0% adults with diabetes   12/11/2012 11:10 AM 5.5 4.6 - 6.5 % Final    Comment:    Glycemic Control Guidelines for People with Diabetes:Non Diabetic:  <6%Goal of Therapy: <7%Additional Action Suggested:  >8%     CBG: Recent Labs  Lab 05/03/19  0745 05/03/19 1128 05/03/19 1730 05/03/19 2142 05/04/19 0729  GLUCAP 155* 145* 184* 169* 108*    Recent Results (from the past 240 hour(s))  SARS CORONAVIRUS 2 (TAT 6-24 HRS)     Status: Abnormal   Collection Time: 04/24/19  7:06 PM  Result Value Ref Range Status   SARS Coronavirus 2 POSITIVE (A) NEGATIVE Final    Comment: RESULT CALLED TO, READ BACK BY AND VERIFIED WITH: RN H BILLITER @ [phone] 04/25/19 BY S GEZAHEGN (NOTE) SARS-CoV-2 target nucleic acids are DETECTED. The SARS-CoV-2 RNA is generally detectable in upper and lower respiratory specimens during the acute phase of infection. Positive results are indicative of active infection with SARS-CoV-2. Clinical  correlation with patient history and other diagnostic information is necessary to determine patient infection status. Positive results do  not rule out bacterial infection or co-infection with other viruses. The expected result is Negative. Fact Sheet for Patients: SugarRoll.be Fact Sheet for Healthcare Providers: https://www.woods-mathews.com/ This test is not yet approved or cleared by the [place] FDA and  has been authorized for detection and/or diagnosis of SARS-CoV-2 by FDA under an Emergency Use Authorization (EUA). This EUA will remain  in effect (meaning this test can be use d) for the duration of the COVID-19 declaration under Section 564(b)(1) of the Act, 21 U.S.C. section 360bbb-3(b)(1), unless the authorization is terminated or revoked sooner. Performed at [facility], [place] [address] [place], [place] [address]   Culture, blood (single) w Reflex to ID Panel     Status: Abnormal   Collection Time: 04/24/19 10:00 PM   Specimen: BLOOD LEFT FOREARM  Result Value Ref Range Status   Specimen Description BLOOD LEFT FOREARM  Final   Special Requests   Final    BOTTLES DRAWN AEROBIC AND ANAEROBIC Blood Culture adequate volume   Culture  Setup Time   Final     IN BOTH AEROBIC AND ANAEROBIC BOTTLES GRAM POSITIVE COCCI CRITICAL RESULT CALLED TO, READ BACK BY AND VERIFIED WITH: N GLOGOVAC PHARMD 04/25/19 2230 JDW    Culture (A)  Final    STAPHYLOCOCCUS SPECIES (COAGULASE NEGATIVE) THE SIGNIFICANCE OF ISOLATING THIS ORGANISM FROM A SINGLE SET OF BLOOD CULTURES WHEN MULTIPLE SETS ARE DRAWN IS UNCERTAIN. PLEASE NOTIFY THE MICROBIOLOGY DEPARTMENT WITHIN ONE WEEK IF SPECIATION AND SENSITIVITIES ARE REQUIRED. MICROCOCCUS SPECIES Standardized susceptibility testing for this organism is not available. Performed at [facility], [place] [address], [place], [place] [address]    Report Status 04/27/2019 FINAL  Final  MRSA PCR Screening     Status: None   Collection Time: 04/26/19  1:00 PM   Specimen: Nasopharyngeal  Result Value Ref Range Status   MRSA by PCR NEGATIVE NEGATIVE Final    Comment:        The GeneXpert MRSA Assay (FDA approved for NASAL specimens only), is one component of a comprehensive MRSA colonization surveillance program. It is not intended to diagnose MRSA infection nor to guide or monitor treatment for MRSA infections. Performed at [facility], [place] [address], [place], [place] [address]   Culture, respiratory (non-expectorated)     Status: None   Collection Time: 04/27/19 10:54 AM   Specimen: Tracheal Aspirate; Respiratory  Result Value Ref Range Status   Specimen Description   Final    TRACHEAL ASPIRATE Performed at [place] [address] [place], [place] [address]    Special Requests   Final    NONE Performed at [facility], [place] [address] [place], [place] [address]    Gram Stain   Final    FEW WBC PRESENT, PREDOMINANTLY PMN FEW GRAM POSITIVE COCCI IN PAIRS RARE GRAM POSITIVE RODS    Culture   Final    Consistent with normal respiratory  flora. Performed at [facility], [place] [address] [place], [place] [address]    Report Status  04/29/2019 FINAL  Final     Scheduled Meds: . aspirin  81 mg Oral Daily  . bethanechol  10 mg Oral QID  . chlorhexidine  15 mL Mouth/Throat BID  . Chlorhexidine Gluconate Cloth  6 each Topical Daily  . cloNIDine  0.1 mg Oral TID  . enoxaparin (LOVENOX) injection  40 mg Subcutaneous Q24H  . FLUoxetine  20 mg Oral Daily  . insulin aspart  0-5 Units Subcutaneous QHS  . insulin aspart  0-9 Units Subcutaneous TID WC  . metoprolol tartrate  5 mg Intravenous Q6H  . pantoprazole  40 mg Oral Daily  . polyethylene glycol  17 g Oral Daily  . QUEtiapine  50 mg Oral QHS  . simethicone  40 mg Oral QID  . tamsulosin  0.4 mg Oral BID     LOS: 10 days   Cherene Altes, MD [facility] Office  [phone] Pager - Text Page per Amion  If 7PM-7AM, please contact night-coverage per Amion 05/04/2019, 7:51 AM

## 2019-05-05 LAB — CBC
HCT: 34.6 % — ABNORMAL LOW (ref 39.0–52.0)
Hemoglobin: 11.3 g/dL — ABNORMAL LOW (ref 13.0–17.0)
MCH: 29.7 pg (ref 26.0–34.0)
MCHC: 32.7 g/dL (ref 30.0–36.0)
MCV: 90.8 fL (ref 80.0–100.0)
Platelets: 369 10*3/uL (ref 150–400)
RBC: 3.81 MIL/uL — ABNORMAL LOW (ref 4.22–5.81)
RDW: 14.6 % (ref 11.5–15.5)
WBC: 11 10*3/uL — ABNORMAL HIGH (ref 4.0–10.5)
nRBC: 0 % (ref 0.0–0.2)

## 2019-05-05 LAB — GLUCOSE, CAPILLARY
Glucose-Capillary: 98 mg/dL (ref 70–99)
Glucose-Capillary: 108 mg/dL — ABNORMAL HIGH (ref 70–99)
Glucose-Capillary: 93 mg/dL (ref 70–99)
Glucose-Capillary: 137 mg/dL — ABNORMAL HIGH (ref 70–99)

## 2019-05-05 LAB — BASIC METABOLIC PANEL
Anion gap: 12 (ref 5–15)
Anion gap: 12 (ref 5–15)
BUN: 25 mg/dL — ABNORMAL HIGH (ref 8–23)
BUN: 21 mg/dL (ref 8–23)
CO2: 24 mmol/L (ref 22–32)
CO2: 21 mmol/L — ABNORMAL LOW (ref 22–32)
Calcium: 8.7 mg/dL — ABNORMAL LOW (ref 8.9–10.3)
Calcium: 8.8 mg/dL — ABNORMAL LOW (ref 8.9–10.3)
Chloride: 101 mmol/L (ref 98–111)
Chloride: 102 mmol/L (ref 98–111)
Creatinine, Ser: 0.85 mg/dL (ref 0.61–1.24)
Creatinine, Ser: 0.87 mg/dL (ref 0.61–1.24)
GFR calc Af Amer: >60 mL/min (ref >60)
GFR calc Af Amer: >60 mL/min (ref >60)
GFR calc non Af Amer: >60 mL/min (ref >60)
GFR calc non Af Amer: >60 mL/min (ref >60)
Glucose, Bld: 109 mg/dL — ABNORMAL HIGH (ref 70–99)
Glucose, Bld: 121 mg/dL — ABNORMAL HIGH (ref 70–99)
Potassium: 2.8 mmol/L — ABNORMAL LOW (ref 3.5–5.1)
Potassium: 3.3 mmol/L — ABNORMAL LOW (ref 3.5–5.1)
Sodium: 137 mmol/L (ref 135–145)
Sodium: 135 mmol/L (ref 135–145)

## 2019-05-05 LAB — PROTIME-INR
INR: 1.1 (ref 0.8–1.2)
Prothrombin Time: 13.6 seconds (ref 11.4–15.2)

## 2019-05-05 LAB — MAGNESIUM: Magnesium: 2.1 mg/dL (ref 1.7–2.4)

## 2019-05-05 LAB — FIBRINOGEN: Fibrinogen: 609 mg/dL — ABNORMAL HIGH (ref 210–475)

## 2019-05-05 MED ORDER — QUETIAPINE FUMARATE 50 MG PO TABS
50.0000 mg | ORAL_TABLET | Freq: Every day | ORAL | Status: DC
  Administered 2019-05-05: 23:00:00 50 mg via ORAL
  Filled 2019-05-05: qty 1

## 2019-05-05 MED ORDER — POTASSIUM CHLORIDE 20 MEQ/15ML (10%) PO SOLN
40.0000 meq | Freq: Once | ORAL | Status: AC
  Administered 2019-05-05: 11:00:00 40 meq via ORAL
  Filled 2019-05-05: qty 30

## 2019-05-05 MED ORDER — LOPERAMIDE HCL 2 MG PO CAPS
2.0000 mg | ORAL_CAPSULE | ORAL | Status: DC | PRN
  Administered 2019-05-06: 2 mg via ORAL
  Filled 2019-05-05 (×2): qty 1

## 2019-05-05 MED ORDER — DEXMEDETOMIDINE HCL IN NACL 400 MCG/100ML IV SOLN
0.4000 ug/kg/h | INTRAVENOUS | Status: DC
  Administered 2019-05-05: 0.8 ug/kg/h via INTRAVENOUS
  Administered 2019-05-05: 19:00:00 1.2 ug/kg/h via INTRAVENOUS
  Filled 2019-05-05 (×3): qty 100

## 2019-05-05 MED ORDER — LORAZEPAM 2 MG/ML IJ SOLN: 0.5000 mg | Freq: Four times a day (QID) | INTRAMUSCULAR | Status: DC | PRN

## 2019-05-05 MED ORDER — BETHANECHOL CHLORIDE 25 MG PO TABS
25.0000 mg | ORAL_TABLET | Freq: Four times a day (QID) | ORAL | Status: DC
  Administered 2019-05-05 – 2019-05-06 (×3): 25 mg via ORAL
  Filled 2019-05-05 (×8): qty 1

## 2019-05-05 MED ORDER — HALOPERIDOL LACTATE 5 MG/ML IJ SOLN
5.0000 mg | Freq: Once | INTRAMUSCULAR | Status: AC
  Administered 2019-05-05: 5 mg via INTRAVENOUS

## 2019-05-05 MED ORDER — POTASSIUM CHLORIDE CRYS ER 20 MEQ PO TBCR
40.0000 meq | EXTENDED_RELEASE_TABLET | Freq: Once | ORAL | Status: AC
  Administered 2019-05-05: 40 meq via ORAL
  Filled 2019-05-05: qty 2

## 2019-05-05 MED ORDER — BETHANECHOL CHLORIDE 25 MG PO TABS
25.0000 mg | ORAL_TABLET | Freq: Four times a day (QID) | ORAL | Status: DC
  Filled 2019-05-05 (×5): qty 1

## 2019-05-05 MED ORDER — IPRATROPIUM-ALBUTEROL 20-100 MCG/ACT IN AERS
1.0000 | INHALATION_SPRAY | Freq: Four times a day (QID) | RESPIRATORY_TRACT | Status: DC | PRN
  Filled 2019-05-05: qty 4

## 2019-05-05 MED ORDER — POTASSIUM CHLORIDE 10 MEQ/100ML IV SOLN
10.0000 meq | INTRAVENOUS | Status: AC
  Administered 2019-05-05 (×4): 10 meq via INTRAVENOUS
  Filled 2019-05-05 (×3): qty 100

## 2019-05-05 NOTE — Progress Notes (Signed)
NAME:  Dennis Moore, MRN:  [id number], DOB:  04-19-1956, LOS: 37 ADMISSION DATE:  04/24/2019, CONSULTATION DATE:  10/14 REFERRING MD:  Alcario Drought, CHIEF COMPLAINT:  Dyspnea   Brief History   62 year old male brought to the emergency room on 10/14 with dyspnea cough fever and hypoxemia from COVID-19 pneumonia.  He tested positive on October 7.  He was intubated in the emergency room and transferred to [facility].  Past Medical History  BPH Depression Hyperlipidemia Hypertension  Significant Hospital Events   10/14 Admit, start decadron and remdesivir 10/15 enroll in [place] study; prone positioning; convalescent plasma; GPC in blood cx >> start vancomycin 10/16 d/c prone positioning 10/18 persistent fever >> start rocephin; Coag neg Staph in blood culture >> contaminate, d/c vancomcin 10/21 weaned on vent all day, confused 10/24 extubated  Consults:  PCCM  Procedures:  10/14 ETT>   Significant Diagnostic Tests:    Micro Data:  10/14 SARS COV2 >> POSITIVE 10/14 Blood >> Coag neg Staph > micrococcus 10/17 Sputum 10/17 >> oral flora  Antimicrobials/COVID Rx:  10/14 remdesivir > 10/19 10/14 decadron >  10/15 convalescent plasma  Levaquin 10/14  Vancomycin 10/15 > 10/18 Rocephin 10/18 >> 10/19  Interim history/subjective:    [name] out of bed Confused yesterday, a little clearer this morning but still intermittently agitated Didn't sleep well  Objective   Blood pressure 119/83, pulse 72, temperature 98.8 F (37.1 C), temperature source Oral, resp. rate (!) 30, height 6' (1.829 m), weight 99.4 kg, SpO2 100 %.        Intake/Output Summary (Last 24 hours) at 05/05/2019 0754 Last data filed at 05/05/2019 0500 Gross per 24 hour  Intake 1800 ml  Output 2190 ml  Net -390 ml   Filed Weights   05/01/19 0500 05/02/19 0500 05/05/19 0500  Weight: 93.3 kg 50 kg 99.4 kg    Examination:  General:  Resting comfortably in bed HENT: NCAT OP clear PULM:  CTA B, normal effort CV: RRR, no mgr GI: BS+, soft, nontender MSK: normal bulk and tone Neuro: awake, alert, oriented to place, year, situation, voice clear, [name] [facility] Problem list     Assessment & Plan:  ARDS due to COVID-19 pneumonia: resolved Out of bed Move to PCU today  Physical deconditioning PT/OT consulted Out of bed  Acute encephalopathy: ICU delirium, toxic metabolic encephalopathy Frequent orientation Lights on at night  Out of ICU  PCCM available prn  Best practice:  Diet: as tolerated Pain/Anxiety/Delirium protocol (if indicated): n/a VAP protocol (if indicated): n/a DVT prophylaxis: lovenox per COVID PACT trial GI prophylaxis: Pantoprazole for stress ulcer prophylaxis Glucose control: SSI Mobility: bed rest Code Status: FULL Family Communication: I updated his wife Juliann Pulse today by phone Disposition: remain in ICU  Labs   CBC: Recent Labs  Lab 04/29/19 0534  04/30/19 0545 05/01/19 0525 05/02/19 0525 05/02/19 0530 05/03/19 0543 05/03/19 1424 05/04/19 0425 05/05/19 0523  WBC 17.3*  --  15.6* 14.7* 16.8*  --  17.7*  --  15.5* 11.0*  NEUTROABS 12.6*  --  10.6* 10.2* 12.7*  --  13.2*  --   --   --   HGB 10.3*   < > 10.7* 10.8* 11.7* 11.9* 12.1* 12.9* 11.3* 11.3*  HCT 31.9*   < > 33.1* 33.7* 36.2* 35.0* 36.9* 38.0* 34.4* 34.6*  MCV 92.7  --  92.7 93.1 91.6  --  91.3  --  90.8 90.8  PLT 418*  --  474*  462* 465*  --  418*  --  389 369   < > = values in this interval not displayed.    Basic Metabolic Panel: Recent Labs  Lab 04/29/19 0534  04/30/19 0515 05/01/19 0525 05/02/19 0525 05/02/19 0530 05/03/19 0543 05/03/19 1424 05/04/19 0425 05/05/19 0523  NA 150*   < > 149* 145 144 145 142 143 142 137  K 3.8   < > 4.2 4.4 3.8 3.9 3.9 3.7 3.3* 2.8*  CL 107  --  103 103 101  --  102  --  104 101  CO2 33*  --  33* 31 29  --  26  --  27 24  GLUCOSE 147*  --  146* 148* 137*  --  159*  --  125* 109*  BUN 31*  --  36* 33* 35*  --   34*  --  32* 25*  CREATININE 0.77  --  0.82 0.88 0.86  --  0.88  --  0.77 0.85  CALCIUM 9.7  --  9.5 9.5 9.5  --  9.2  --  8.9 8.7*  MG 2.0  --  2.2 2.2  --   --   --   --   --  2.1  PHOS 3.8  --  5.3* 4.3  --   --   --   --   --   --    < > = values in this interval not displayed.   GFR: Estimated Creatinine Clearance: 108.6 mL/min (by C-G formula based on SCr of 0.85 mg/dL). Recent Labs  Lab 05/02/19 0525 05/03/19 0543 05/04/19 0425 05/05/19 0523  WBC 16.8* 17.7* 15.5* 11.0*    Liver Function Tests: Recent Labs  Lab 04/30/19 0515 05/01/19 0525 05/02/19 0525 05/03/19 0543 05/04/19 0425  AST 118* 129* 104* 79* 62*  ALT 132* 173* 207* 202* 169*  ALKPHOS 64 59 72 70 58  BILITOT 0.3 0.5 0.6 0.5 1.0  PROT 7.1 7.2 7.5 7.6 7.1  ALBUMIN 2.6* 2.8* 3.0* 3.1* 3.1*   No results for input(s): LIPASE, AMYLASE in the last 168 hours. No results for input(s): AMMONIA in the last 168 hours.  ABG    Component Value Date/Time   PHART 7.481 (H) 05/03/2019 1424   PCO2ART 38.5 05/03/2019 1424   PO2ART 67.0 (L) 05/03/2019 1424   HCO3 28.6 (H) 05/03/2019 1424   TCO2 30 05/03/2019 1424   ACIDBASEDEF 3.0 (H) 04/25/2019 0510   O2SAT 94.0 05/03/2019 1424     Coagulation Profile: Recent Labs  Lab 05/01/19 0525 05/02/19 0525 05/03/19 0543 05/04/19 0425 05/05/19 0523  INR 1.2 1.1 1.1 1.1 1.1    Cardiac Enzymes: No results for input(s): CKTOTAL, CKMB, CKMBINDEX, TROPONINI in the last 168 hours.  HbA1C: Hgb A1c MFr Bld  Date/Time Value Ref Range Status  05/02/2019 05:25 AM 6.7 (H) 4.8 - 5.6 % Final    Comment:    (NOTE) Pre diabetes:          5.7%-6.4% Diabetes:              >6.4% Glycemic control for   <7.0% adults with diabetes   12/11/2012 11:10 AM 5.5 4.6 - 6.5 % Final    Comment:    Glycemic Control Guidelines for People with Diabetes:Non Diabetic:  <6%Goal of Therapy: <7%Additional Action Suggested:  >8%     CBG: Recent Labs  Lab 05/04/19 0729 05/04/19 1214  05/04/19 1731 05/04/19 2059 05/05/19 0734  GLUCAP 108* 108* 131* 122* 98  Critical care time: n/a      Roselie Awkward, MD [place] [facility] Pager: [phone] Cell: [phone] If no response, call [phone]

## 2019-05-05 NOTE — Progress Notes (Addendum)
Dennis Moore  [id number] DOB: 1955/12/02 DOA: 04/24/2019 PCP: Ann Held, DO    Brief Narrative:  63 year old with a history of depression, HTN, HLD, and BPH who suffered the onset of cough and fevers 10/6.  He tested positive for Covid on 10/8.  He presented to the ED when he developed worsening shortness of breath and respiratory distress.  His oxygen saturation was in the 80s on room air and improved only to 89% on nonrebreather mask.  He was tachypneic in the 30s and 40s.  Chest x-ray confirmed multifocal bilateral infiltrates.  Significant Events: 10/6 symptom onset 10/8 Covid positive 10/14 admit via [place] - intubated 10/15 enrolled in Covid PACT study 10/23 extubated  COVID-19 specific Treatment: Decadron 10/14 > 23 Remdesivir 10/14 > 10/18 Convalescent plasma 10/15  Subjective: Remains stable from a respiratory standpoint.  Delirium proving to be an issue.  More alert today.  Knows where he is and why.  Remains subtly confused and is quite impulsive.  Exceedingly high fall risk having suffered a fall yesterday from bed.  Assessment & Plan:  Covid pneumonia - ARDS - acute hypoxic and hypercapnic respiratory failure Successfully extubated 10/23 - has completed a course of remdesivir - was dosed with convalescent plasma - Decadron course has been completed -respiratory status much improved -essentially weaned to room air at this time  Hypernatremia Corrected with free water  Severe hypokalemia  Due to GI losses and poor oral intake - supplement per IV and oral -magnesium is normal  Post-extubation dysphagia Tolerating full liquid diet thus far - no chronic dyphagia issues - SLP to re-eval on Monday  Watery diarrhea Imodium -no features to suggest an infectious colitis at this time - may simply be due to simethicone and miralax (stopped both)  Toxic metabolic encephalopathy Monitor mental status with continued weaning of sedation -slowly improving  but remains quite impulsive putting him at high risk for fall with injury  Mild transaminitis Likely due to Covid itself versus remdesivir -steadily improving -continue to hold statin for now  Recent Labs  Lab 04/30/19 0515 05/01/19 0525 05/02/19 0525 05/03/19 0543 05/04/19 0425  AST 118* 129* 104* 79* 62*  ALT 132* 173* 207* 202* 169*  ALKPHOS 64 59 72 70 58  BILITOT 0.3 0.5 0.6 0.5 1.0  PROT 7.1 7.2 7.5 7.6 7.1  ALBUMIN 2.6* 2.8* 3.0* 3.1* 3.1*    HTN Blood pressure currently reasonably controlled - follow without change  HLD Holding statin in the setting of mild transaminitis  Steroid-induced hyperglycemia - possible newly diagnosed diabetes Continue sliding scale insulin -CBG proving easy to control - A1c 6.7 technically meets criteria for diabetes -before making formal diagnosis will suggest follow-up in outpatient setting after complete recovery from Covid   BPH Intermittent retention continues to pose a challenge -hopefully getting the patient up in a chair and moving more with assist with this - continue Flomax and Urecholine -attempt to avoid replacing Foley catheter using I&O cath for the next 24 hours if significant retention confirmed with bladder scanning  Nutrition Feeding tube output post extubation -cleared for full liquid diet by SLP post extubation -advance diet as able  Coag negative staph in blood culture Felt to be simple contaminant -no clinical evidence of active infection presently  DVT prophylaxis: COVID PACT study Code Status: FULL CODE Family Communication:  Disposition Plan: Consider transfer to PCU later today if mental status will allow - presently concerned about likelihood of falling again if not very  closely monitored  Consultants:  PCCM  Antimicrobials:  Levaquin 10/14 Vancomycin 10/15 > 10/18 Rocephin 10/18 > 10/19  Objective: Blood pressure 119/83, pulse 72, temperature 98.8 F (37.1 C), temperature source Oral, resp. rate (!)  30, height 6' (1.829 m), weight 99.4 kg, SpO2 100 %.  Intake/Output Summary (Last 24 hours) at 05/05/2019 0756 Last data filed at 05/05/2019 0500 Gross per 24 hour  Intake 1800 ml  Output 2190 ml  Net -390 ml   Filed Weights   05/01/19 0500 05/02/19 0500 05/05/19 0500  Weight: 93.3 kg 50 kg 99.4 kg    Examination: General: No acute distress - alert and oriented but mildly confused Lungs: Good air movement throughout with no wheezing Cardiovascular: RRR without murmur or rub Abdomen: NT/ND, soft, BS positive Extremities: No significant edema bilateral lower extremities  CBC: Recent Labs  Lab 05/01/19 0525 05/02/19 0525  05/03/19 0543 05/03/19 1424 05/04/19 0425 05/05/19 0523  WBC 14.7* 16.8*  --  17.7*  --  15.5* 11.0*  NEUTROABS 10.2* 12.7*  --  13.2*  --   --   --   HGB 10.8* 11.7*   < > 12.1* 12.9* 11.3* 11.3*  HCT 33.7* 36.2*   < > 36.9* 38.0* 34.4* 34.6*  MCV 93.1 91.6  --  91.3  --  90.8 90.8  PLT 462* 465*  --  418*  --  389 369   < > = values in this interval not displayed.   Basic Metabolic Panel: Recent Labs  Lab 04/29/19 0534  04/30/19 0515 05/01/19 0525  05/03/19 0543 05/03/19 1424 05/04/19 0425 05/05/19 0523  NA 150*   < > 149* 145   < > 142 143 142 137  K 3.8   < > 4.2 4.4   < > 3.9 3.7 3.3* 2.8*  CL 107  --  103 103   < > 102  --  104 101  CO2 33*  --  33* 31   < > 26  --  27 24  GLUCOSE 147*  --  146* 148*   < > 159*  --  125* 109*  BUN 31*  --  36* 33*   < > 34*  --  32* 25*  CREATININE 0.77  --  0.82 0.88   < > 0.88  --  0.77 0.85  CALCIUM 9.7  --  9.5 9.5   < > 9.2  --  8.9 8.7*  MG 2.0  --  2.2 2.2  --   --   --   --  2.1  PHOS 3.8  --  5.3* 4.3  --   --   --   --   --    < > = values in this interval not displayed.   GFR: Estimated Creatinine Clearance: 108.6 mL/min (by C-G formula based on SCr of 0.85 mg/dL).  Liver Function Tests: Recent Labs  Lab 05/01/19 0525 05/02/19 0525 05/03/19 0543 05/04/19 0425  AST 129* 104* 79* 62*   ALT 173* 207* 202* 169*  ALKPHOS 59 72 70 58  BILITOT 0.5 0.6 0.5 1.0  PROT 7.2 7.5 7.6 7.1  ALBUMIN 2.8* 3.0* 3.1* 3.1*    Coagulation Profile: Recent Labs  Lab 05/01/19 0525 05/02/19 0525 05/03/19 0543 05/04/19 0425 05/05/19 0523  INR 1.2 1.1 1.1 1.1 1.1    HbA1C: Hgb A1c MFr Bld  Date/Time Value Ref Range Status  05/02/2019 05:25 AM 6.7 (H) 4.8 - 5.6 % Final    Comment:    (  NOTE) Pre diabetes:          5.7%-6.4% Diabetes:              >6.4% Glycemic control for   <7.0% adults with diabetes   12/11/2012 11:10 AM 5.5 4.6 - 6.5 % Final    Comment:    Glycemic Control Guidelines for People with Diabetes:Non Diabetic:  <6%Goal of Therapy: <7%Additional Action Suggested:  >8%     CBG: Recent Labs  Lab 05/04/19 0729 05/04/19 1214 05/04/19 1731 05/04/19 2059 05/05/19 0734  GLUCAP 108* 108* 131* 122* 98    Recent Results (from the past 240 hour(s))  MRSA PCR Screening     Status: None   Collection Time: 04/26/19  1:00 PM   Specimen: Nasopharyngeal  Result Value Ref Range Status   MRSA by PCR NEGATIVE NEGATIVE Final    Comment:        The GeneXpert MRSA Assay (FDA approved for NASAL specimens only), is one component of a comprehensive MRSA colonization surveillance program. It is not intended to diagnose MRSA infection nor to guide or monitor treatment for MRSA infections. Performed at [facility], [place] [address] [place], [place] [address]   Culture, respiratory (non-expectorated)     Status: None   Collection Time: 04/27/19 10:54 AM   Specimen: Tracheal Aspirate; Respiratory  Result Value Ref Range Status   Specimen Description   Final    TRACHEAL ASPIRATE Performed at [place] [address] [place], [place] [address]    Special Requests   Final    NONE Performed at [facility], [place] [address], [place], [place] [address]    Gram Stain   Final    FEW WBC PRESENT, PREDOMINANTLY PMN  FEW GRAM POSITIVE COCCI IN PAIRS RARE GRAM POSITIVE RODS    Culture   Final    Consistent with normal respiratory flora. Performed at [facility], [place] [address], [place], [place] [address]    Report Status 04/29/2019 FINAL  Final     Scheduled Meds: . aspirin  81 mg Oral Daily  . bethanechol  10 mg Oral QID  . chlorhexidine  15 mL Mouth/Throat BID  . Chlorhexidine Gluconate Cloth  6 each Topical Daily  . cloNIDine  0.1 mg Oral TID  . enoxaparin (LOVENOX) injection  40 mg Subcutaneous Q24H  . FLUoxetine  20 mg Oral Daily  . insulin aspart  0-5 Units Subcutaneous QHS  . insulin aspart  0-9 Units Subcutaneous TID WC  . pantoprazole  40 mg Oral Daily  . polyethylene glycol  17 g Oral Daily  . QUEtiapine  25 mg Oral QHS  . simethicone  40 mg Oral QID  . tamsulosin  0.4 mg Oral BID     LOS: 11 days   Cherene Altes, MD [facility] Office  [phone] Pager - Text Page per Amion  If 7PM-7AM, please contact night-coverage per Amion 05/05/2019, 7:56 AM

## 2019-05-05 NOTE — Progress Notes (Signed)
[place] for Lovenox Indication: VTE prophylaxis (COVID-PACT trial - prophylaxis dose)   Allergies  Allergen Reactions  . Penicillins Swelling and Rash    Patient Measurements: Height: 6' (182.9 cm) Weight: 219 lb 2.2 oz (99.4 kg) IBW/kg (Calculated) : 77.6  Vital Signs: Temp: 97.5 F (36.4 C) (10/25 0800) Temp Source: Oral (10/25 0800) BP: 136/76 (10/25 1000) Pulse Rate: 86 (10/25 1000)  Labs: Recent Labs    05/03/19 0543 05/03/19 1424 05/04/19 0425 05/05/19 0523  HGB 12.1* 12.9* 11.3* 11.3*  HCT 36.9* 38.0* 34.4* 34.6*  PLT 418*  --  389 369  LABPROT 13.7  --  13.7 13.6  INR 1.1  --  1.1 1.1  CREATININE 0.88  --  0.77 0.85    Estimated Creatinine Clearance: 108.6 mL/min (by C-G formula based on SCr of 0.85 mg/dL).   Medical History: Past Medical History:  Diagnosis Date  . Allergic rhinitis   . BPH (benign prostatic hypertrophy)   . Depression   . Erectile dysfunction   . Hyperlipidemia   . Hypertension   . Internal hemorrhoids   . Tubular adenoma of colon 11/2008    Medications:  Medications Prior to Admission  Medication Sig Dispense Refill Last Dose  . albuterol (PROVENTIL HFA;VENTOLIN HFA) 108 (90 Base) MCG/ACT inhaler Inhale 2 puffs into the lungs every 6 (six) hours as needed for wheezing. 54 Inhaler 3 04/24/2019 at Unknown time  . amLODipine (NORVASC) 5 MG tablet Take 5 mg by mouth daily.   04/23/2019  . aspirin EC 81 MG tablet Take 81 mg by mouth daily.   04/23/2019  . atorvastatin (LIPITOR) 80 MG tablet Take 80 mg by mouth daily.   04/23/2019  . [EXPIRED] benzonatate (TESSALON) 200 MG capsule Take 200 mg by mouth 3 (three) times daily as needed for cough.   04/23/2019  . cholecalciferol (VITAMIN D3) 25 MCG (1000 UT) tablet Take 1,000 Units by mouth daily.   04/23/2019  . FLUoxetine (PROZAC) 20 MG capsule Take 20 mg by mouth daily.   04/23/2019  . lisinopril-hydrochlorothiazide (PRINZIDE,ZESTORETIC) 20-12.5  MG tablet Take 2 tablets by mouth daily. 60 tablet 11 04/23/2019  . omeprazole (PRILOSEC) 20 MG capsule Take 20 mg by mouth daily as needed for indigestion.   Past Week at Unknown time  . sildenafil (VIAGRA) 100 MG tablet Take 0.5 tablets (50 mg total) by mouth daily as needed for erectile dysfunction. 10 tablet 0 unk  . vitamin C (ASCORBIC ACID) 500 MG tablet Take 500 mg by mouth daily.   04/23/2019  . zinc gluconate 50 MG tablet Take 50 mg by mouth daily.   04/23/2019  . rosuvastatin (CRESTOR) 20 MG tablet Take 1 tablet (20 mg total) by mouth daily. 30 tablet 5     Assessment: Dennis Moore admitted to the ICU with respiratory failure secondary to COVID-19 pneumonia. Pharmacy consulted to transition Lovenox to COVID-PACT regimen for VTE prophylaxis. Patient randomized to the prophylaxis dose arm.  Hgb low but stable at 11.3, platelets decreased to WNL at 369.  CrCl > 30 ml/min, BMI < 35. No overt bleeding noted.  Goal of Therapy:  VTE prophylaxis Monitor platelets by anticoagulation protocol: Yes   Plan:  -Continue Lovenox 40 mg SQ daily  -Monitor CBC and renal function    Gretta Arab PharmD, BCPS Clinical pharmacist phone 7am- 5pm: [phone] 05/05/2019 11:51 AM

## 2019-05-05 NOTE — Progress Notes (Signed)
[facility] PCCM Evening Rounds  Climbing out of bed Would not redirect for me or nurse Has pulled out multiple IV's Gave haldol, no response Plan precedex infusion, seroquel  Roselie Awkward, MD [facility] Pager: [phone] Cell: [phone] If no response, call [phone]

## 2019-05-05 NOTE — Progress Notes (Signed)
Spoke with daughter Luevenia Maxin on the phone. Updated her on her father's condition.    Spoke with pt's wife on facetime with pt. Pt has his personal phone at bedside and is facetiming multiple family members

## 2019-05-05 NOTE — Progress Notes (Signed)
Spoke with wife updated on status.  All questions answered at this time.

## 2019-05-05 NOTE — Progress Notes (Signed)
Around 1500 I took a bathroom break and asked fellow nursing staff to assist in watching pt while I was briefly off the unit. I assured the pt was lying comfortably in bed prior to my leaving the unit. Upon my arrival back to unit, I was informed the pt had rolled out of bed and was found lying on the floor by fellow nursing staff members. Pt was back in bed by the time I arrived back in the room. Pt appeared to be unharmed. No overt injuries noted on pt. VSS. Dr. Thereasa Solo informed of pt's fall. I called pt's wife, Dennis Moore, and informed her that her pt had rolled out of bed. Assured her that no signs of injury were noted. Informed her of further fall precautions we had initiated to prevent another fall from happening. Wife responded well to information provided. Wife had no further questions at that time. Implemented high fall risk precautions on pt to help prevent further falls from occurring. Will continue to monitor pt.

## 2019-05-06 LAB — COMPREHENSIVE METABOLIC PANEL
ALT: 127 U/L — ABNORMAL HIGH (ref 0–44)
AST: 49 U/L — ABNORMAL HIGH (ref 15–41)
Albumin: 3.1 g/dL — ABNORMAL LOW (ref 3.5–5.0)
Alkaline Phosphatase: 67 U/L (ref 38–126)
Anion gap: 10 (ref 5–15)
BUN: 16 mg/dL (ref 8–23)
CO2: 21 mmol/L — ABNORMAL LOW (ref 22–32)
Calcium: 8.6 mg/dL — ABNORMAL LOW (ref 8.9–10.3)
Chloride: 107 mmol/L (ref 98–111)
Creatinine, Ser: 0.71 mg/dL (ref 0.61–1.24)
GFR calc Af Amer: >60 mL/min (ref >60)
GFR calc non Af Amer: >60 mL/min (ref >60)
Glucose, Bld: 125 mg/dL — ABNORMAL HIGH (ref 70–99)
Potassium: 3.6 mmol/L (ref 3.5–5.1)
Sodium: 138 mmol/L (ref 135–145)
Total Bilirubin: 0.4 mg/dL (ref 0.3–1.2)
Total Protein: 6.7 g/dL (ref 6.5–8.1)

## 2019-05-06 LAB — PROTIME-INR
INR: 1.1 (ref 0.8–1.2)
Prothrombin Time: 13.9 seconds (ref 11.4–15.2)

## 2019-05-06 LAB — GLUCOSE, CAPILLARY
Glucose-Capillary: 117 mg/dL — ABNORMAL HIGH (ref 70–99)
Glucose-Capillary: 152 mg/dL — ABNORMAL HIGH (ref 70–99)
Glucose-Capillary: 84 mg/dL (ref 70–99)
Glucose-Capillary: 105 mg/dL — ABNORMAL HIGH (ref 70–99)

## 2019-05-06 LAB — FOLATE: Folate: 15.1 ng/mL (ref >5.9)

## 2019-05-06 LAB — VITAMIN B12: Vitamin B-12: 1046 pg/mL — ABNORMAL HIGH (ref 180–914)

## 2019-05-06 LAB — MAGNESIUM: Magnesium: 2.4 mg/dL (ref 1.7–2.4)

## 2019-05-06 MED ORDER — QUETIAPINE FUMARATE 25 MG PO TABS
50.0000 mg | ORAL_TABLET | Freq: Two times a day (BID) | ORAL | Status: DC
  Administered 2019-05-06 – 2019-05-08 (×5): 50 mg via ORAL
  Filled 2019-05-06 (×2): qty 2
  Filled 2019-05-06: qty 1
  Filled 2019-05-06: qty 2
  Filled 2019-05-06: qty 1

## 2019-05-06 MED ORDER — BETHANECHOL CHLORIDE 10 MG PO TABS
10.0000 mg | ORAL_TABLET | Freq: Four times a day (QID) | ORAL | Status: DC
  Administered 2019-05-06 – 2019-05-08 (×7): 10 mg via ORAL
  Filled 2019-05-06 (×12): qty 1

## 2019-05-06 MED ORDER — POTASSIUM CHLORIDE CRYS ER 20 MEQ PO TBCR
40.0000 meq | EXTENDED_RELEASE_TABLET | Freq: Once | ORAL | Status: AC
  Administered 2019-05-06: 40 meq via ORAL
  Filled 2019-05-06: qty 2

## 2019-05-06 NOTE — Progress Notes (Signed)
Dennis Moore  [id number] DOB: July 20, 1955 DOA: 04/24/2019 PCP: Ann Held, DO    Brief Narrative:  63 year old with a history of depression, HTN, HLD, and BPH who suffered the onset of cough and fevers 10/6.  He tested positive for Covid on 10/8.  He presented to the ED when he developed worsening shortness of breath and respiratory distress.  His oxygen saturation was in the 80s on room air and improved only to 89% on nonrebreather mask.  He was tachypneic in the 30s and 40s.  Chest x-ray confirmed multifocal bilateral infiltrates.  Significant Events: 10/6 symptom onset 10/8 Covid positive 10/14 admit via [place] - intubated 10/15 enrolled in Covid PACT study 10/23 extubated  COVID-19 specific Treatment: Decadron 10/14 > 23 Remdesivir 10/14 > 10/18 Convalescent plasma 10/15  Subjective: Severe intermittent agitation remains an ongoing problem.  Required initiation of Precedex drip last evening.  Sedated at time of exam today.  In no acute respiratory distress.  Assessment & Plan:  Covid pneumonia - ARDS - acute hypoxic and hypercapnic respiratory failure Successfully extubated 10/23 - has completed a course of remdesivir - was dosed with convalescent plasma - Decadron course has been completed -respiratory status much improved -essentially weaned to room air at this time  Acute toxic metabolic encephalopathy Increase Seroquel dose -wean off Precedex as able -delirium precautions and delirium prevention measures to continue  Hypernatremia Corrected   Severe hypokalemia  Due to GI losses and poor oral intake -improved -supplement again today with goal of 4.0  Post-extubation dysphagia no chronic dyphagia issues -cleared to advance to regular diet by SLP today  Watery diarrhea Imodium -no features to suggest an infectious colitis at this time - may simply be due to simethicone and miralax (stopped both) -appears to be improving  Mild transaminitis Likely  due to Covid itself versus remdesivir -steadily improving -continue to hold statin for now  Recent Labs  Lab 05/01/19 0525 05/02/19 0525 05/03/19 0543 05/04/19 0425 05/06/19 0835  AST 129* 104* 79* 62* 49*  ALT 173* 207* 202* 169* 127*  ALKPHOS 59 72 70 58 67  BILITOT 0.5 0.6 0.5 1.0 0.4  PROT 7.2 7.5 7.6 7.1 6.7  ALBUMIN 2.8* 3.0* 3.1* 3.1* 3.1*    HTN Blood pressure currently reasonably controlled - follow without change  HLD Holding statin in the setting of mild transaminitis  Steroid-induced hyperglycemia - possible newly diagnosed diabetes Continue sliding scale insulin -CBG proving easy to control - A1c 6.7 technically meets criteria for diabetes -before making formal diagnosis will suggest follow-up in outpatient setting after complete recovery from Covid   BPH Intermittent retention continues to pose a challenge -hopefully getting the patient up in a chair and moving more with assist with this - continue Flomax and Urecholine -given the need for repeat in and out caths over the last 36 hours we will replace Foley catheter today and give voiding trial again when more mobile and alert  Coag negative staph in blood culture Felt to be simple contaminant -no clinical evidence of active infection presently  DVT prophylaxis: COVID PACT study Code Status: FULL CODE Family Communication:  Disposition Plan: Remain in ICU due to need for Precedex drip  Consultants:  PCCM  Antimicrobials:  Levaquin 10/14 Vancomycin 10/15 > 10/18 Rocephin 10/18 > 10/19  Objective: Blood pressure 128/79, pulse (!) 48, temperature 98 F (36.7 C), temperature source Oral, resp. rate (!) 23, height 6' (1.829 m), weight 99.4 kg, SpO2 99 %.  Intake/Output  Summary (Last 24 hours) at 05/06/2019 1424 Last data filed at 05/06/2019 0600 Gross per 24 hour  Intake 298.98 ml  Output 1800 ml  Net -1501.02 ml   Filed Weights   05/01/19 0500 05/02/19 0500 05/05/19 0500  Weight: 93.3 kg 50 kg 99.4  kg    Examination: General: No acute distress - sedated on Precedex Lungs: No focal crackles or wheezing -good air movement throughout Cardiovascular: RRR-no murmur Abdomen: NT/ND, soft, BS positive Extremities: No significant edema B LE   CBC: Recent Labs  Lab 05/01/19 0525 05/02/19 0525  05/03/19 0543 05/03/19 1424 05/04/19 0425 05/05/19 0523  WBC 14.7* 16.8*  --  17.7*  --  15.5* 11.0*  NEUTROABS 10.2* 12.7*  --  13.2*  --   --   --   HGB 10.8* 11.7*   < > 12.1* 12.9* 11.3* 11.3*  HCT 33.7* 36.2*   < > 36.9* 38.0* 34.4* 34.6*  MCV 93.1 91.6  --  91.3  --  90.8 90.8  PLT 462* 465*  --  418*  --  389 369   < > = values in this interval not displayed.   Basic Metabolic Panel: Recent Labs  Lab 04/30/19 0515 05/01/19 0525  05/05/19 0523 05/05/19 1450 05/06/19 0835  NA 149* 145   < > 137 135 138  K 4.2 4.4   < > 2.8* 3.3* 3.6  CL 103 103   < > 101 102 107  CO2 33* 31   < > 24 21* 21*  GLUCOSE 146* 148*   < > 109* 121* 125*  BUN 36* 33*   < > 25* 21 16  CREATININE 0.82 0.88   < > 0.85 0.87 0.71  CALCIUM 9.5 9.5   < > 8.7* 8.8* 8.6*  MG 2.2 2.2  --  2.1  --  2.4  PHOS 5.3* 4.3  --   --   --   --    < > = values in this interval not displayed.   GFR: Estimated Creatinine Clearance: 115.4 mL/min (by C-G formula based on SCr of 0.71 mg/dL).  Liver Function Tests: Recent Labs  Lab 05/02/19 0525 05/03/19 0543 05/04/19 0425 05/06/19 0835  AST 104* 79* 62* 49*  ALT 207* 202* 169* 127*  ALKPHOS 72 70 58 67  BILITOT 0.6 0.5 1.0 0.4  PROT 7.5 7.6 7.1 6.7  ALBUMIN 3.0* 3.1* 3.1* 3.1*    Coagulation Profile: Recent Labs  Lab 05/02/19 0525 05/03/19 0543 05/04/19 0425 05/05/19 0523 05/06/19 0835  INR 1.1 1.1 1.1 1.1 1.1    HbA1C: Hgb A1c MFr Bld  Date/Time Value Ref Range Status  05/02/2019 05:25 AM 6.7 (H) 4.8 - 5.6 % Final    Comment:    (NOTE) Pre diabetes:          5.7%-6.4% Diabetes:              >6.4% Glycemic control for   <7.0% adults with  diabetes   12/11/2012 11:10 AM 5.5 4.6 - 6.5 % Final    Comment:    Glycemic Control Guidelines for People with Diabetes:Non Diabetic:  <6%Goal of Therapy: <7%Additional Action Suggested:  >8%     CBG: Recent Labs  Lab 05/05/19 1219 05/05/19 1713 05/05/19 2110 05/06/19 0801 05/06/19 1239  GLUCAP 108* 93 137* 117* 152*    Recent Results (from the past 240 hour(s))  Culture, respiratory (non-expectorated)     Status: None   Collection Time: 04/27/19 10:54 AM   Specimen:  Tracheal Aspirate; Respiratory  Result Value Ref Range Status   Specimen Description   Final    TRACHEAL ASPIRATE Performed at [facility] [address], [place], [place] [address]    Special Requests   Final    NONE Performed at [facility], [place] [address], [place], [place] [address]    Gram Stain   Final    FEW WBC PRESENT, PREDOMINANTLY PMN FEW GRAM POSITIVE COCCI IN PAIRS RARE GRAM POSITIVE RODS    Culture   Final    Consistent with normal respiratory flora. Performed at [facility], [place] [address], [place], [place] [address]    Report Status 04/29/2019 FINAL  Final     Scheduled Meds: . aspirin  81 mg Oral Daily  . bethanechol  25 mg Oral QID  . chlorhexidine  15 mL Mouth/Throat BID  . Chlorhexidine Gluconate Cloth  6 each Topical Daily  . cloNIDine  0.1 mg Oral TID  . enoxaparin (LOVENOX) injection  40 mg Subcutaneous Q24H  . FLUoxetine  20 mg Oral Daily  . insulin aspart  0-5 Units Subcutaneous QHS  . insulin aspart  0-9 Units Subcutaneous TID WC  . QUEtiapine  50 mg Oral BID  . tamsulosin  0.4 mg Oral BID   . dexmedetomidine (PRECEDEX) IV infusion 0.9 mcg/kg/hr (05/06/19 0711)    LOS: 12 days   Cherene Altes, MD [facility] Office  [phone] Pager - Text Page per Amion  If 7PM-7AM, please contact night-coverage per Amion 05/06/2019, 2:24 PM

## 2019-05-06 NOTE — Progress Notes (Signed)
OT Evaluation  Pt coming out of sedation. Pt states "I was causing trouble". 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 VSS during session on RA; likly hypotensive after standing. Required Mod A +2 with mobility and overall mod A with ADL. If pt continues to progress, will most likely be able to DC home with [facility] and 24/7 S. Will need information about PLOF, home set up and available assistance after DC. Will follow acutely.     05/06/19 1600  OT Visit Information  Last OT Received On 05/06/19  Assistance Needed +2  PT/OT/SLP Co-Evaluation/Treatment Yes  Reason for Co-Treatment Complexity of the patient's impairments (multi-system involvement);Necessary to address cognition/behavior during functional activity;To address functional/ADL transfers  OT goals addressed during session ADL's and self-care  History of Present Illness HPI: 63 year old with a history of depression, HTN, HLD, and BPH who suffered the onset of cough and fevers 10/6.  He tested positive for Covid on 10/8.  He presented to the ED when he developed worsening shortness of breath and respiratory distress.  His oxygen saturation was in the 80s on room air and improved only to 89% on nonrebreather mask.  He was tachypneic in the 30s and 40s.  Chest x-ray confirmed multifocal bilateral infiltrates. ETT 10/14-23.  Precautions  Precautions Fall  Precaution Comments has been agitiated  Home Living  Family/patient expects to be discharged to: Private residence  Living Arrangements Spouse/significant other  Available Help at Discharge Family  Additional Comments unable to get history completed from patient. patient states that he was in [employer], has a PhD, was a POW.  Prior Function  Level of Independence Independent  Communication  Communication Expressive difficulties (soft voice)  Pain Assessment   Pain Assessment Faces  Faces Pain Scale 2  Pain Location general discomfort  Pain Descriptors / Indicators Discomfort  Pain Intervention(s) Limited activity within patient's tolerance  Cognition  Arousal/Alertness Awake/alert  Behavior During Therapy Flat affect  Overall Cognitive Status Impaired/Different from baseline  Area of Impairment Orientation;Attention;Memory;Following commands;Safety/judgement;Awareness;Problem solving  Orientation Level Disoriented to;Time  Current Attention Level Sustained  Memory Decreased recall of precautions;Decreased short-term memory  Following Commands Follows one step commands with increased time  Safety/Judgement Decreased awareness of safety;Decreased awareness of deficits  Awareness Emergent  Problem Solving Slow processing;Decreased initiation;Difficulty sequencing;Requires verbal cues  General Comments Pt asking address of hospital; talking about being in the army and his time in [place] and [place]; states he was in prison camp. Has a PhD; Will need to get home set up information  Upper Extremity Assessment  Upper Extremity Assessment Generalized weakness  Lower Extremity Assessment  Lower Extremity Assessment Defer to PT evaluation  Cervical / Trunk Assessment  Cervical / Trunk Assessment Normal  ADL  Overall ADL's  Needs assistance/impaired  Eating/Feeding Minimal assistance  Grooming Minimal assistance;Sitting  Upper Body Bathing Moderate assistance;Sitting  Lower Body Bathing Moderate assistance;Sit to/from stand  Upper Body Dressing  Minimal assistance;Sitting  Lower Body Dressing Moderate assistance;Sit to/from stand  Functional mobility during ADLs +2 for physical assistance;Moderate assistance  Vision- History  Baseline Vision/History Wears glasses  Bed Mobility  Overal bed mobility Needs Assistance  Bed Mobility Rolling;Sidelying to Sit;Sit to Sidelying  Rolling Min assist  Sidelying to sit Min assist;Mod assist  General  bed mobility comments multimodal cueas to roll, push up to  sitting, assist with  legs and trunk to  sit up and to return to sidelying,  Transfers  Overall transfer level Needs assistance  Equipment used 2 person hand held assist  Transfers Sit to/from Stand  Sit to Stand Mod assist;+2 physical assistance;+2 safety/equipment  General transfer comment multimodal cues to push from bed raised, small side steps along bed with 2 HHA.  Balance  Overall balance assessment Needs assistance  Sitting-balance support Bilateral upper extremity supported;Feet unsupported  Sitting balance-Leahy Scale Fair  Standing balance support During functional activity;Bilateral upper extremity supported  Standing balance-Leahy Scale Poor  Standing balance comment tending to sway posteriorly  OT - End of Session  Activity Tolerance Patient tolerated treatment well  Patient left in bed;with call bell/phone within reach;with bed alarm set  Nurse Communication Mobility status  OT Assessment  OT Recommendation/Assessment Patient needs continued OT Services  OT Visit Diagnosis Unsteadiness on feet (R26.81);Other abnormalities of gait and mobility (R26.89);Muscle weakness (generalized) (M62.81);Other symptoms and signs involving cognitive function  OT Problem List Decreased strength;Decreased activity tolerance;Impaired balance (sitting and/or standing);Decreased cognition;Decreased safety awareness;Decreased knowledge of use of DME or AE;Cardiopulmonary status limiting activity  OT Plan  OT Frequency (ACUTE ONLY) Min 3X/week  OT Treatment/Interventions (ACUTE ONLY) Self-care/ADL training;Therapeutic exercise;Neuromuscular education;Energy conservation;DME and/or AE instruction;Therapeutic activities;Cognitive remediation/compensation;Patient/family education;Balance training  AM-PAC OT "6 Clicks" Daily Activity Outcome Measure (Version 2)  Help from another person eating meals? 3  Help from another person taking care of  personal grooming? 3  Help from another person toileting, which includes using toliet, bedpan, or urinal? 2  Help from another person bathing (including washing, rinsing, drying)? 2  Help from another person to put on and taking off regular upper body clothing? 2  Help from another person to put on and taking off regular lower body clothing? 2  6 Click Score 14  OT Recommendation  Follow Up Recommendations Home health OT;Supervision/Assistance - 24 hour;SNF (pending progress)  OT Equipment 3 in 1 bedside commode  Individuals Consulted  Consulted and Agree with Results and Recommendations Patient unable/family or caregiver not available  Acute Rehab OT Goals  Patient Stated Goal agreeable to sitting up  OT Goal Formulation Patient unable to participate in goal setting  Time For Goal Achievement 05/20/19  Potential to Achieve Goals Good  OT Time Calculation  OT Start Time (ACUTE ONLY) 0940  OT Stop Time (ACUTE ONLY) 1019  OT Time Calculation (min) 39 min  OT General Charges  $OT Visit 1 Visit  OT Evaluation  $OT Eval Moderate Complexity 1 Mod  OT Treatments  $Self Care/Home Management  8-22 mins  Written Expression  Dominant Hand Right  Maurie Boettcher, OT/L   Acute OT Clinical Specialist [place] Pager [phone] Office [phone]

## 2019-05-06 NOTE — Progress Notes (Signed)
Report was called to the PCU nurse. Pt's wife was also called to let her know that pt will be going to the PCU tonight. Will continue to monitor

## 2019-05-06 NOTE — Progress Notes (Signed)
Pt refusing AM lab draw at this time.  Pt reoriented and educated still refused.

## 2019-05-06 NOTE — Evaluation (Addendum)
Physical Therapy Evaluation Patient Details Name: Dennis Moore MRN: [id number] DOB: Feb 21, 1956 Today's Date: 05/06/2019   History of Present Illness  HPI: 62 year old with a history of depression, HTN, HLD, and BPH who suffered the onset of cough and fevers 10/6.  He tested positive for Covid on 10/8.  He presented to the ED when he developed worsening shortness of breath and respiratory distress.  His oxygen saturation was in the 80s on room air and improved only to 89% on nonrebreather mask.  He was tachypneic in the 30s and 40s.  Chest x-ray confirmed multifocal bilateral infiltrates. ETT 10/14-23.  Clinical Impression  Per RN, Precedex weaning down. Patient awake, somewhat sluggish, voice very soft. Patient  Participated in bed mobility, sitting on bed edge and standing with 2 HHA.  Will need further information about  Patient's Prior Level Of  Function and home set up as  Patient unable to provide all history. Pt admitted with above diagnosis.  Pt currently with functional limitations due to the deficits listed below (see PT Problem List). Pt will benefit from skilled PT to increase their independence and safety with mobility to allow discharge to the venue listed below.   Patient remained on RA with 97% SPO2, 61 HR, BP 126/84    Follow Up Recommendations SNF;Home health PT(depends on progress and 24/7 caregivers)    Equipment Recommendations  (tbd)    Recommendations for Other Services       Precautions / Restrictions Precautions Precautions: Fall Precaution Comments: has been agitiated      Mobility  Bed Mobility Overal bed mobility: Needs Assistance Bed Mobility: Rolling;Sidelying to Sit;Sit to Sidelying Rolling: Min assist Sidelying to sit: Min assist;Mod assist       General bed mobility comments: multimodal cueas to roll, push up to  sitting, assist with legs and trunk to  sit up and to return to sidelying,  Transfers Overall transfer level: Needs  assistance Equipment used: 2 person hand held assist Transfers: Sit to/from Stand Sit to Stand: Mod assist;+2 physical assistance;+2 safety/equipment         General transfer comment: multimodal cues to push from bed raised, small side steps along bed with 2 HHA.  Ambulation/Gait                Stairs            Wheelchair Mobility    Modified Rankin (Stroke Patients Only)       Balance Overall balance assessment: Needs assistance Sitting-balance support: Bilateral upper extremity supported;Feet unsupported Sitting balance-Leahy Scale: Fair     Standing balance support: During functional activity;Bilateral upper extremity supported Standing balance-Leahy Scale: Poor Standing balance comment: tending to sway posteriorly                             Pertinent Vitals/Pain Pain Assessment: No/denies pain    Home Living Family/patient expects to be discharged to:: Private residence Living Arrangements: Spouse/significant other Available Help at Discharge: Family             Additional Comments: unable to get history completed from patient. patient states that he was in [employer], has a PhD, was a POW.    Prior Function Level of Independence: Independent               Hand Dominance        Extremity/Trunk Assessment        Lower Extremity Assessment Lower Extremity Assessment:  Generalized weakness    Cervical / Trunk Assessment Cervical / Trunk Assessment: Normal  Communication      Cognition     Overall Cognitive Status: Impaired/Different from baseline Area of Impairment: Orientation;Attention;Memory;Following commands;Safety/judgement;Awareness;Problem solving                 Orientation Level: Disoriented to;Time;Situation Current Attention Level: Alternating Memory: Decreased short-term memory Following Commands: Follows one step commands consistently Safety/Judgement: Decreased awareness of safety;Decreased  awareness of deficits Awareness: Emergent Problem Solving: Slow processing;Decreased initiation;Requires verbal cues General Comments: oriented to [facility].      General Comments      Exercises     Assessment/Plan    PT Assessment Patient needs continued PT services  PT Problem List Decreased strength;Decreased mobility;Decreased safety awareness;Decreased knowledge of precautions;Decreased activity tolerance;Decreased cognition;Decreased balance;Decreased knowledge of use of DME       PT Treatment Interventions DME instruction;Therapeutic activities;Gait training;Therapeutic exercise;Patient/family education;Cognitive remediation;Functional mobility training    PT Goals (Current goals can be found in the Care Plan section)  Acute Rehab PT Goals Patient Stated Goal: agreeable to sitting up PT Goal Formulation: Patient unable to participate in goal setting Time For Goal Achievement: 05/20/19 Potential to Achieve Goals: Good    Frequency Min 3X/week   Barriers to discharge        Co-evaluation PT/OT/SLP Co-Evaluation/Treatment: Yes Reason for Co-Treatment: For patient/therapist safety;Necessary to address cognition/behavior during functional activity PT goals addressed during session: Mobility/safety with mobility OT goals addressed during session: ADL's and self-care       AM-PAC PT "6 Clicks" Mobility  Outcome Measure Help needed turning from your back to your side while in a flat bed without using bedrails?: A Little Help needed moving from lying on your back to sitting on the side of a flat bed without using bedrails?: A Little Help needed moving to and from a bed to a chair (including a wheelchair)?: A Lot Help needed standing up from a chair using your arms (e.g., wheelchair or bedside chair)?: A Lot Help needed to walk in hospital room?: A Lot Help needed climbing 3-5 steps with a railing? : Total 6 Click Score: 13    End of Session   Activity  Tolerance: Patient tolerated treatment well Patient left: in bed;with call bell/phone within reach;with nursing/sitter in room Nurse Communication: Mobility status PT Visit Diagnosis: Unsteadiness on feet (R26.81);Muscle weakness (generalized) (M62.81);Difficulty in walking, not elsewhere classified (R26.2)    Time: [id number] PT Time Calculation (min) (ACUTE ONLY): 39 min   Charges:   PT Evaluation $PT Eval Moderate Complexity: [place] Pager [phone] Office [phone]    Claretha Cooper 05/06/2019, 2:21 PM

## 2019-05-06 NOTE — Progress Notes (Signed)
NAME:  LATOYA MANJARRES, MRN:  [id number], DOB:  [birth year], LOS: 12 ADMISSION DATE:  04/24/2019, CONSULTATION DATE:  10/14 REFERRING MD:  Alcario Drought, CHIEF COMPLAINT:  Dyspnea   Brief History   63 year old male brought to the emergency room on 10/14 with dyspnea cough fever and hypoxemia from COVID-19 pneumonia.  He tested positive on October 7.  He was intubated in the emergency room and transferred to [facility].  Past Medical History  BPH Depression Hyperlipidemia Hypertension  Significant Hospital Events   10/14 Admit, start decadron and remdesivir 10/15 enroll in [place] study; prone positioning; convalescent plasma; GPC in blood cx >> start vancomycin 10/16 d/c prone positioning 10/18 persistent fever >> start rocephin; Coag neg Staph in blood culture >> contaminate, d/c vancomcin 10/21 weaned on vent all day, confused 10/24 extubated  Consults:  PCCM  Procedures:  10/14 ETT>   Significant Diagnostic Tests:    Micro Data:  10/14 SARS COV2 >> POSITIVE 10/14 Blood >> Coag neg Staph > micrococcus 10/17 Sputum 10/17 >> oral flora  Antimicrobials/COVID Rx:  10/14 remdesivir > 10/19 10/14 decadron >  10/15 convalescent plasma  Levaquin 10/14  Vancomycin 10/15 > 10/18 Rocephin 10/18 >> 10/19  Interim history/subjective:   Combative, climbing out of bed again last night, given haldol, no improvement Ativan made him more confused when given in morning Started precedex, restarted seroquel Has been calling his family throughout the night  Objective   Blood pressure 128/79, pulse (!) 48, temperature 98 F (36.7 C), temperature source Oral, resp. rate (!) 23, height 6' (1.829 m), weight 99.4 kg, SpO2 99 %.        Intake/Output Summary (Last 24 hours) at 05/06/2019 1016 Last data filed at 05/06/2019 0600 Gross per 24 hour  Intake 898.98 ml  Output 2375 ml  Net -1476.02 ml   Filed Weights   05/01/19 0500 05/02/19 0500 05/05/19 0500  Weight: 93.3  kg 50 kg 99.4 kg    Examination:  General:  Resting comfortably in bed HENT: NCAT OP clear PULM: CTA B, normal effort CV: RRR, no mgr GI: BS+, soft, nontender MSK: normal bulk and tone Neuro: awake, alert, no distress, MAEW   Resolved Hospital Problem list     Assessment & Plan:  ARDS due to COVID-19 pneumonia: resolved Out of bed if able Monitor O2 saturation  Physical deconditioning PT/OT consultation  Acute encephalopathy: severe, risk to self; ICU delirium, toxic metabolic encephalopathy Frequent orientation Double seroquel dose Wean off precedex as able Lights on during daytime, off at night   Best practice:  Diet: as tolerated Pain/Anxiety/Delirium protocol (if indicated): n/a VAP protocol (if indicated): n/a DVT prophylaxis: lovenox per COVID PACT trial GI prophylaxis: n/a Glucose control: SSI Mobility: bed rest Code Status: FULL Family Communication: I updated Kathy by phone today Disposition: remain in ICU  Labs   CBC: Recent Labs  Lab 04/30/19 0545 05/01/19 0525 05/02/19 0525 05/02/19 0530 05/03/19 0543 05/03/19 1424 05/04/19 0425 05/05/19 0523  WBC 15.6* 14.7* 16.8*  --  17.7*  --  15.5* 11.0*  NEUTROABS 10.6* 10.2* 12.7*  --  13.2*  --   --   --   HGB 10.7* 10.8* 11.7* 11.9* 12.1* 12.9* 11.3* 11.3*  HCT 33.1* 33.7* 36.2* 35.0* 36.9* 38.0* 34.4* 34.6*  MCV 92.7 93.1 91.6  --  91.3  --  90.8 90.8  PLT 474* 462* 465*  --  418*  --  389 0000000    Basic Metabolic Panel: Recent Labs  Lab 04/30/19 0515 05/01/19 0525 05/02/19 0525  05/03/19 0543 05/03/19 1424 05/04/19 0425 05/05/19 0523 05/05/19 1450  NA 149* 145 144   < > 142 143 142 137 135  K 4.2 4.4 3.8   < > 3.9 3.7 3.3* 2.8* 3.3*  CL 103 103 101  --  102  --  104 101 102  CO2 33* 31 29  --  26  --  27 24 21*  GLUCOSE 146* 148* 137*  --  159*  --  125* 109* 121*  BUN 36* 33* 35*  --  34*  --  32* 25* 21  CREATININE 0.82 0.88 0.86  --  0.88  --  0.77 0.85 0.87  CALCIUM 9.5 9.5 9.5   --  9.2  --  8.9 8.7* 8.8*  MG 2.2 2.2  --   --   --   --   --  2.1  --   PHOS 5.3* 4.3  --   --   --   --   --   --   --    < > = values in this interval not displayed.   GFR: Estimated Creatinine Clearance: 106.1 mL/min (by C-G formula based on SCr of 0.87 mg/dL). Recent Labs  Lab 05/02/19 0525 05/03/19 0543 05/04/19 0425 05/05/19 0523  WBC 16.8* 17.7* 15.5* 11.0*    Liver Function Tests: Recent Labs  Lab 04/30/19 0515 05/01/19 0525 05/02/19 0525 05/03/19 0543 05/04/19 0425  AST 118* 129* 104* 79* 62*  ALT 132* 173* 207* 202* 169*  ALKPHOS 64 59 72 70 58  BILITOT 0.3 0.5 0.6 0.5 1.0  PROT 7.1 7.2 7.5 7.6 7.1  ALBUMIN 2.6* 2.8* 3.0* 3.1* 3.1*   No results for input(s): LIPASE, AMYLASE in the last 168 hours. No results for input(s): AMMONIA in the last 168 hours.  ABG    Component Value Date/Time   PHART 7.481 (H) 05/03/2019 1424   PCO2ART 38.5 05/03/2019 1424   PO2ART 67.0 (L) 05/03/2019 1424   HCO3 28.6 (H) 05/03/2019 1424   TCO2 30 05/03/2019 1424   ACIDBASEDEF 3.0 (H) 04/25/2019 0510   O2SAT 94.0 05/03/2019 1424     Coagulation Profile: Recent Labs  Lab 05/01/19 0525 05/02/19 0525 05/03/19 0543 05/04/19 0425 05/05/19 0523  INR 1.2 1.1 1.1 1.1 1.1    Cardiac Enzymes: No results for input(s): CKTOTAL, CKMB, CKMBINDEX, TROPONINI in the last 168 hours.  HbA1C: Hgb A1c MFr Bld  Date/Time Value Ref Range Status  05/02/2019 05:25 AM 6.7 (H) 4.8 - 5.6 % Final    Comment:    (NOTE) Pre diabetes:          5.7%-6.4% Diabetes:              >6.4% Glycemic control for   <7.0% adults with diabetes   12/11/2012 11:10 AM 5.5 4.6 - 6.5 % Final    Comment:    Glycemic Control Guidelines for People with Diabetes:Non Diabetic:  <6%Goal of Therapy: <7%Additional Action Suggested:  >8%     CBG: Recent Labs  Lab 05/05/19 0734 05/05/19 1219 05/05/19 1713 05/05/19 2110 05/06/19 0801  GLUCAP 98 108* 93 137* 117*     Critical care time: 30 minutes       Roselie Awkward, MD [place] [facility] Pager: [phone] Cell: [phone] If no response, call [phone]

## 2019-05-06 NOTE — Progress Notes (Signed)
Pt agitated trying to get out of bed.  Pushing nurse away.  Combative.  States "[facility]".  Tried reorienting pt.  Pt spoke with wife.

## 2019-05-06 NOTE — Progress Notes (Signed)
  Speech Language Pathology Treatment: Dysphagia  Patient Details Name: Dennis Moore MRN: [id number] DOB: [birth year] Today's Date: 05/06/2019 Time: 1020-1050 SLP Time Calculation (min) (ACUTE ONLY): 30 min  Assessment / Plan / Recommendation Clinical Impression  Pt much more alert and participatory than when last seen 10/23; confusion persists, easier to redirect.  Pt's voice remains with low volume but is clear/good quality and not concerning for glottal insufficiency.  Pt consumed 6 oz of thin liquid with no s/s of aspiration, no cough, no wet phonation, ongoing good vocal quality.  Respiratory/swallowing coordination appears adequate with audible expiration immediately post-swallow.  Pt consumed solids with adequate oral manipulation/clearance.  He took his pills whole with thin liquids without deficit.  He appears to be swallowing well and is ready to advance diet. Recommend starting regular solids, thin liquids.  SLP will f/u x1 to ensure safety/toleration. D/W RN.   HPI HPI: 63 year old with a history of depression, HTN, HLD, and BPH who suffered the onset of cough and fevers 10/6.  He tested positive for Covid on 10/8.  He presented to the ED when he developed worsening shortness of breath and respiratory distress.  His oxygen saturation was in the 80s on room air and improved only to 89% on nonrebreather mask.  He was tachypneic in the 30s and 40s.  Chest x-ray confirmed multifocal bilateral infiltrates. ETT 10/14-23.       SLP Plan  Continue with current plan of care       Recommendations  Diet recommendations: Regular;Thin liquid Liquids provided via: Cup;Straw Medication Administration: Whole meds with liquid Supervision: Patient able to self feed(assist with tray set-up) Compensations: Minimize environmental distractions Postural Changes and/or Swallow Maneuvers: Seated upright 90 degrees                Oral Care Recommendations: Oral care BID Follow up  Recommendations: None Plan: Continue with current plan of care       GO               Dennis Moore L. Tivis Ringer, MA CCC/SLP Acute Rehabilitation Services Office number [phone]   Dennis Moore 05/06/2019, 11:43 AM

## 2019-05-06 NOTE — Progress Notes (Signed)
SLP Cancellation Note  Patient Details Name: [name] MRN: [id number] DOB: 07-Feb-1956   Cancelled treatment:  Pt lethargic; will attempt f/u this afternoon if schedule permits.  Yuriy Cui L. Tivis Ringer, [place] CCC/SLP Acute Rehabilitation Services Office number [phone]                                                                                                    [name] 05/06/2019, 9:37 AM

## 2019-05-07 ENCOUNTER — Inpatient Hospital Stay (HOSPITAL_COMMUNITY): Payer: 59

## 2019-05-07 DIAGNOSIS — S3730XA Unspecified injury of urethra, initial encounter: Secondary | ICD-10-CM

## 2019-05-07 LAB — COMPREHENSIVE METABOLIC PANEL
ALT: 108 U/L — ABNORMAL HIGH (ref 0–44)
AST: 37 U/L (ref 15–41)
Albumin: 3.1 g/dL — ABNORMAL LOW (ref 3.5–5.0)
Alkaline Phosphatase: 69 U/L (ref 38–126)
Anion gap: 10 (ref 5–15)
BUN: 12 mg/dL (ref 8–23)
CO2: 20 mmol/L — ABNORMAL LOW (ref 22–32)
Calcium: 8.6 mg/dL — ABNORMAL LOW (ref 8.9–10.3)
Chloride: 106 mmol/L (ref 98–111)
Creatinine, Ser: 0.81 mg/dL (ref 0.61–1.24)
GFR calc Af Amer: >60 mL/min (ref >60)
GFR calc non Af Amer: >60 mL/min (ref >60)
Glucose, Bld: 95 mg/dL (ref 70–99)
Potassium: 3.8 mmol/L (ref 3.5–5.1)
Sodium: 136 mmol/L (ref 135–145)
Total Bilirubin: 0.8 mg/dL (ref 0.3–1.2)
Total Protein: 6.4 g/dL — ABNORMAL LOW (ref 6.5–8.1)

## 2019-05-07 LAB — CBC
HCT: 36.5 % — ABNORMAL LOW (ref 39.0–52.0)
Hemoglobin: 11.9 g/dL — ABNORMAL LOW (ref 13.0–17.0)
MCH: 30.1 pg (ref 26.0–34.0)
MCHC: 32.6 g/dL (ref 30.0–36.0)
MCV: 92.2 fL (ref 80.0–100.0)
Platelets: 316 10*3/uL (ref 150–400)
RBC: 3.96 MIL/uL — ABNORMAL LOW (ref 4.22–5.81)
RDW: 14.8 % (ref 11.5–15.5)
WBC: 8.6 10*3/uL (ref 4.0–10.5)
nRBC: 0 % (ref 0.0–0.2)

## 2019-05-07 LAB — PROTIME-INR
INR: 1 (ref 0.8–1.2)
Prothrombin Time: 13.3 seconds (ref 11.4–15.2)

## 2019-05-07 LAB — GLUCOSE, CAPILLARY
Glucose-Capillary: 89 mg/dL (ref 70–99)
Glucose-Capillary: 78 mg/dL (ref 70–99)
Glucose-Capillary: 90 mg/dL (ref 70–99)
Glucose-Capillary: 147 mg/dL — ABNORMAL HIGH (ref 70–99)

## 2019-05-07 MED ORDER — MENTHOL 3 MG MT LOZG
1.0000 | LOZENGE | OROMUCOSAL | Status: DC | PRN
  Administered 2019-05-07 – 2019-05-08 (×3): 3 mg via ORAL
  Filled 2019-05-07: qty 9

## 2019-05-07 NOTE — Progress Notes (Addendum)
PROGRESS NOTE  Dennis Moore [id number] DOB: Feb 21, 1956 DOA: 04/24/2019 PCP: Ann Held, DO  HPI/Recap of past 56 hours: 63 year old with a history of depression, HTN, HLD, and BPH who suffered the onset of cough and fevers 10/6.  He tested positive for Covid on 10/8.  He presented to the ED on 10/14 when he developed worsening shortness of breath and respiratory distress.  His oxygen saturation was in the 80s on room air and improved only to 89% on nonrebreather mask.  He was tachypneic in the 30s and 40s.  Chest x-ray confirmed multifocal bilateral infiltrates.  He was then admitted & intubated that same day.  Patient treated with Actemra, convalescent plasma, Remdisivir and steroids.  Able to be extubated on 10/23.  Course complicated by metabolic encephalopathy and agitation requiring Precedex drip.  This was able to be weaned off and patient has become more alert.  Transferred to floor from ICU on 10/26 evening.  Today, patient feels much better, much more alert and oriented.  Still very weak and fatigued.  No other complaints.  Breathing comfortably.  Assessment/Plan: Principal Problem: Ventilator associated pneumonia/acute respiratory distress syndrome (ARDS) causing acute respiratory failure with hypoxia due to COVID-19 virus [facility]): Patient treated with Actemra, convalescent plasma, Remdisivir and steroids.  Able to be extubated on 10/23.  Improving. Active Problems:   Essential hypertension: Stable.  And clonidine.    Depression: Continue Prozac.  Acute metabolic encephalopathy: Secondary to ICU delirium, steroids.  Improving.  On Seroquel, Prozac plus as needed Haldol.  Precedex drip weaned off.  Overweight: Patient meets criteria with BMI greater than 30  Addendum: Urethral trauma: On afternoon of 10/27, nursing reported that patient pulled out his Foley.  Minimal amount of blood.  We are continuing to monitor.   Code Status: Full code   Family  Communication: Spoke with Wife   Disposition Plan: Potentially tomorrow home with home PT, wife confirmed someone is available 24/7.  Waiting to see if he is oriented enough so that he will be coming home   Consultants:  PCCM   Procedures:  On ventilator from 10/14-10/23  Antimicrobials: Levaquin 10/14 Vancomycin 10/15 > 10/18 Rocephin 10/18 > 10/19  DVT prophylaxis: COVID PACT study   Objective: Vitals:   05/07/19 0725 05/07/19 1129  BP: 134/72 (!) 147/81  Pulse: 72 73  Resp: 18 (!) 25  Temp: 97.8 F (36.6 C) 98 F (36.7 C)  SpO2: 97% 97%    Intake/Output Summary (Last 24 hours) at 05/07/2019 1211 Last data filed at 05/07/2019 0800 Gross per 24 hour  Intake -  Output 855 ml  Net -855 ml   Filed Weights   05/01/19 0500 05/02/19 0500 05/05/19 0500  Weight: 93.3 kg 50 kg 99.4 kg   Body mass index is 29.72 kg/m.  Exam:   General:   Alert and oriented x2, no acute distress  HEENT: Normocephalic and atraumatic, mucous membranes are moist  Neck is thick, narrow airway  Cardiovascular: Regular rate and rhythm, S1-S2  Respiratory: Clear to auscultation bilaterally  Abdomen: Soft, nontender, nondistended, positive bowel sounds  Musculoskeletal: No clubbing or cyanosis or edema  Skin: No skin breaks, tears or lesions  Psychiatry: Minimally confused, but much more oriented than previous.   Data Reviewed: CBC: Recent Labs  Lab 05/01/19 0525 05/02/19 0525  05/03/19 0543 05/03/19 1424 05/04/19 0425 05/05/19 0523 05/07/19 0850  WBC 14.7* 16.8*  --  17.7*  --  15.5* 11.0* 8.6  NEUTROABS 10.2* 12.7*  --  13.2*  --   --   --   --   HGB 10.8* 11.7*   < > 12.1* 12.9* 11.3* 11.3* 11.9*  HCT 33.7* 36.2*   < > 36.9* 38.0* 34.4* 34.6* 36.5*  MCV 93.1 91.6  --  91.3  --  90.8 90.8 92.2  PLT 462* 465*  --  418*  --  389 369 316   < > = values in this interval not displayed.   Basic Metabolic Panel: Recent Labs  Lab 05/01/19 0525  05/04/19 0425  05/05/19 0523 05/05/19 1450 05/06/19 0835 05/07/19 0850  NA 145   < > 142 137 135 138 136  K 4.4   < > 3.3* 2.8* 3.3* 3.6 3.8  CL 103   < > 104 101 102 107 106  CO2 31   < > 27 24 21* 21* 20*  GLUCOSE 148*   < > 125* 109* 121* 125* 95  BUN 33*   < > 32* 25* 21 16 12   CREATININE 0.88   < > 0.77 0.85 0.87 0.71 0.81  CALCIUM 9.5   < > 8.9 8.7* 8.8* 8.6* 8.6*  MG 2.2  --   --  2.1  --  2.4  --   PHOS 4.3  --   --   --   --   --   --    < > = values in this interval not displayed.   GFR: Estimated Creatinine Clearance: 113.9 mL/min (by C-G formula based on SCr of 0.81 mg/dL). Liver Function Tests: Recent Labs  Lab 05/02/19 0525 05/03/19 0543 05/04/19 0425 05/06/19 0835 05/07/19 0850  AST 104* 79* 62* 49* 37  ALT 207* 202* 169* 127* 108*  ALKPHOS 72 70 58 67 69  BILITOT 0.6 0.5 1.0 0.4 0.8  PROT 7.5 7.6 7.1 6.7 6.4*  ALBUMIN 3.0* 3.1* 3.1* 3.1* 3.1*   No results for input(s): LIPASE, AMYLASE in the last 168 hours. No results for input(s): AMMONIA in the last 168 hours. Coagulation Profile: Recent Labs  Lab 05/03/19 0543 05/04/19 0425 05/05/19 0523 05/06/19 0835 05/07/19 0850  INR 1.1 1.1 1.1 1.1 1.0   Cardiac Enzymes: No results for input(s): CKTOTAL, CKMB, CKMBINDEX, TROPONINI in the last 168 hours. BNP (last 3 results) No results for input(s): PROBNP in the last 8760 hours. HbA1C: No results for input(s): HGBA1C in the last 72 hours. CBG: Recent Labs  Lab 05/06/19 1239 05/06/19 1701 05/06/19 2119 05/07/19 0801 05/07/19 1128  GLUCAP 152* 84 105* 89 78   Lipid Profile: No results for input(s): CHOL, HDL, LDLCALC, TRIG, CHOLHDL, LDLDIRECT in the last 72 hours. Thyroid Function Tests: No results for input(s): TSH, T4TOTAL, FREET4, T3FREE, THYROIDAB in the last 72 hours. Anemia Panel: Recent Labs    05/06/19 0835  VITAMINB12 1,046*  FOLATE 15.1   Urine analysis:    Component Value Date/Time   COLORURINE YELLOW 01/05/2010 2000   APPEARANCEUR CLEAR  01/05/2010 2000   LABSPEC 1.027 01/05/2010 2000   PHURINE 8.5 (H) 01/05/2010 2000   GLUCOSEU NEGATIVE 01/05/2010 2000   HGBUR NEGATIVE 01/05/2010 2000   HGBUR negative 12/04/2008 0802   BILIRUBINUR neg 08/25/2015 1206   KETONESUR NEGATIVE 01/05/2010 2000   PROTEINUR neg 08/25/2015 1206   PROTEINUR 30 (A) 01/05/2010 2000   UROBILINOGEN 0.2 08/25/2015 1206   UROBILINOGEN 1.0 01/05/2010 2000   NITRITE neg 08/25/2015 1206   NITRITE NEGATIVE 01/05/2010 2000   LEUKOCYTESUR Negative 08/25/2015 1206   Sepsis Labs: @LABRCNTIP (procalcitonin:4,lacticidven:4)  )No results  found for this or any previous visit (from the past 240 hour(s)).    Studies: No results found.  Scheduled Meds: . aspirin  81 mg Oral Daily  . bethanechol  10 mg Oral QID  . chlorhexidine  15 mL Mouth/Throat BID  . Chlorhexidine Gluconate Cloth  6 each Topical Daily  . cloNIDine  0.1 mg Oral TID  . enoxaparin (LOVENOX) injection  40 mg Subcutaneous Q24H  . FLUoxetine  20 mg Oral Daily  . insulin aspart  0-5 Units Subcutaneous QHS  . insulin aspart  0-9 Units Subcutaneous TID WC  . QUEtiapine  50 mg Oral BID  . tamsulosin  0.4 mg Oral BID    Continuous Infusions: . dexmedetomidine (PRECEDEX) IV infusion Stopped (05/06/19 1032)     LOS: 13 days     Annita Brod, MD [facility]  To reach me or the doctor on call, go to: [email or web address] Password [email or web address]  05/07/2019, 12:11 PM

## 2019-05-07 NOTE — Progress Notes (Signed)
Pt continues to ooze blood from tip of penis. Dr. Maryland Pink notified and made aware of VS. HR remains in the 70s and mental status is unchanged. Will continue to assess and monitor.

## 2019-05-07 NOTE — Progress Notes (Signed)
  Speech Language Pathology Treatment: Dysphagia  Patient Details Name: Dennis Moore MRN: [id number] DOB: September 20, 1955 Today's Date: 05/07/2019 Time: 4827-0786 SLP Time Calculation (min) (ACUTE ONLY): 11 min  Assessment / Plan / Recommendation Clinical Impression  Pt transferred to PCU last night.  He is clearer this am but still with confusion.  [place] with his wife upon entry.  Explained purpose of my visit to [name].  Pt demonstrated resolved dysphagia with adequate attention and mastication of solids; brisk swallow response; no s/s of aspiration, despite eating dual consistencies and successive boluses of water.  No further SLP/swallow needs identified.  Continue regular solids, thin liquids. SLP will sign off.    HPI HPI: 63 year old with a history of depression, HTN, HLD, and BPH who suffered the onset of cough and fevers 10/6.  He tested positive for Covid on 10/8.  He presented to the ED when he developed worsening shortness of breath and respiratory distress.  His oxygen saturation was in the 80s on room air and improved only to 89% on nonrebreather mask.  He was tachypneic in the 30s and 40s.  Chest x-ray confirmed multifocal bilateral infiltrates. ETT 10/14-23.       SLP Plan  All goals met       Recommendations  Diet recommendations: Regular;Thin liquid Liquids provided via: Cup;Straw Medication Administration: Whole meds with liquid Supervision: Patient able to self feed Compensations: Minimize environmental distractions                Oral Care Recommendations: Oral care BID Follow up Recommendations: None SLP Visit Diagnosis: Dysphagia, unspecified (R13.10) Plan: All goals met       GO                [name] 05/07/2019, 8:40 AM  Estill Bamberg L. Tivis Ringer, [place] Office number [phone]

## 2019-05-07 NOTE — Progress Notes (Deleted)
Patient with MEWS currently score of 2 in yellow since 1547.

## 2019-05-07 NOTE — Progress Notes (Signed)
Patient with MEWS currently score of 2 in yellow since 1547.

## 2019-05-07 NOTE — Progress Notes (Signed)
Physical Therapy Treatment Patient Details Name: Dennis Moore MRN: [id number] DOB: 1955/09/22 Today's Date: 05/07/2019    History of Present Illness HPI: 63 year old with a history of depression, HTN, HLD, and BPH who suffered the onset of cough and fevers 10/6.  He tested positive for Covid on 10/8.  He presented to the ED when he developed worsening shortness of breath and respiratory distress.  His oxygen saturation was in the 80s on room air and improved only to 89% on nonrebreather mask.  He was tachypneic in the 30s and 40s.  Chest x-ray confirmed multifocal bilateral infiltrates. ETT 10/14-23.    PT Comments    THe patient demonstrates decreased safety awareness as noted during mobility, not taking foley along when mobilizing, abruptly stopped ambulating and began to move around to front of WC and trying to sit down when  Foot pedals down and foley still attached behind the WC. The patient did note to be 3/4. SPO2 92% when returned to room. Continue PT  With goal for ambulation  Safely with No AD by Dc. Patient currently will require close supervision due to safety.  Follow Up Recommendations  Home health PT     Equipment Recommendations  (TBD)    Recommendations for Other Services       Precautions / Restrictions Precautions Precautions: Fall Precaution Comments: decreased safety,    Mobility  Bed Mobility   Bed Mobility: Sit to Supine       Sit to supine: Supervision   General bed mobility comments: self assisted self back into bed  Transfers Overall transfer level: Needs assistance Equipment used: (WC) Transfers: Stand Pivot Transfers Sit to Stand: Min assist Stand pivot transfers: Min assist       General transfer comment: cues for safety  due to foley catheter, stood from recliner, stepped over to bed. PT managed foley entire time,  Ambulation/Gait Ambulation/Gait assistance: Min assist;Min guard Gait Distance (Feet): 180 Feet Assistive device:  Pushed wheelchair Gait Pattern/deviations: Step-to pattern;Step-through pattern;Drifts right/left Gait velocity: decr   General Gait Details: gait very slow speed, abrubtly stopped and began to walk infront of WC to sit down, see cognition section for details.   Stairs             Wheelchair Mobility    Modified Rankin (Stroke Patients Only)       Balance                                            Cognition Arousal/Alertness: Awake/alert   Overall Cognitive Status: Impaired/Different from baseline Area of Impairment: Safety/judgement                   Current Attention Level: Sustained Memory: Decreased recall of precautions Following Commands: Follows multi-step commands consistently Safety/Judgement: Decreased awareness of safety;Decreased awareness of deficits Awareness: Emergent   General Comments: patient lying in his bed,on his cell phone with a vendor,  ordering and giving out his credit card number so PT left and returned about 5 minutes later, patient sitting on edge of bed with his pants and shirt on top of his hospital gown  and had his slides on and his foley catheter  still attached to the bed. PT asked patient about the catheter and responded that he had forgotten about it.Reminded patient  of safety and calling staff for assistance. while  ambulating, pushing WC,  thwe patient all of asudden started to walk arond to from=nt of WC in order to sit down, Cues for safety  as leg rests were down as patient tried to get around them and his foley catheter was still behind the WC. Patient required to provide steady assist and cueing.      Exercises General Exercises - Upper Extremity Shoulder Horizontal ABduction: AROM;Both;10 reps;Seated;Theraband Theraband Level (Shoulder Horizontal Abduction): Level 2 (Red) Elbow Extension: 10 reps;AROM;Strengthening;Both;Theraband Theraband Level (Elbow Extension): Level 2 (Red) General Exercises -  Lower Extremity Long Arc Quad: [id number] reps;10 reps;Both Hip Flexion/Marching: AROM;20 reps;Seated;Both    General Comments        Pertinent Vitals/Pain Pain Assessment: No/denies pain    Home Living                      Prior Function            PT Goals (current goals can now be found in the care plan section) Progress towards PT goals: Progressing toward goals    Frequency    Min 3X/week      PT Plan Current plan remains appropriate    Co-evaluation              AM-PAC PT "6 Clicks" Mobility   Outcome Measure  Help needed turning from your back to your side while in a flat bed without using bedrails?: None Help needed moving from lying on your back to sitting on the side of a flat bed without using bedrails?: None Help needed moving to and from a bed to a chair (including a wheelchair)?: A Little Help needed standing up from a chair using your arms (e.g., wheelchair or bedside chair)?: A Little Help needed to walk in hospital room?: A Little Help needed climbing 3-5 steps with a railing? : A Lot 6 Click Score: 19    End of Session   Activity Tolerance: Patient tolerated treatment well Patient left: in bed;with call bell/phone within reach Nurse Communication: Mobility status PT Visit Diagnosis: Unsteadiness on feet (R26.81);Muscle weakness (generalized) (M62.81);Difficulty in walking, not elsewhere classified (R26.2)     Time: [id number] PT Time Calculation (min) (ACUTE ONLY): 48 min  Charges:  $Gait Training: 23-37 mins $Therapeutic Exercise: 8-22 mins                      Tresa Endo PT Acute Rehabilitation Services  Office [phone]    Dennis Moore 05/07/2019, 2:17 PM

## 2019-05-07 NOTE — Progress Notes (Signed)
Call made to pt's wife to discuss plan of care. Questions answered. Per wife, pt reported getting out of bed today and falling. Pt is disoriented and has been found getting out of bed but denies falling to nursing staff. No signs of injury and pt reports no pain. Orders put in for telesitter due to pt noncompliance with safety strategies.

## 2019-05-07 NOTE — Progress Notes (Signed)
Went to pt's room and pt c/o penile pain. I assessed his catheter and found that he had pulled it out with the balloon inflated. Moderate amount of frank blood noted in the bed and tubing. Penis cleaned and assessment finds small amount of blood oozing from tip of penis. Dr. Maryland Pink notified.

## 2019-05-07 NOTE — Progress Notes (Signed)
VASCULAR LAB PRELIMINARY  PRELIMINARY  PRELIMINARY  PRELIMINARY  Bilateral lower extremity venous duplex completed.    Preliminary report:  See CV proc for preliminary results.   Kouper Spinella, RVT 05/07/2019, 3:00 PM

## 2019-05-07 NOTE — Plan of Care (Signed)
  Problem: Education: Goal: Knowledge of General Education information will improve Description: Including pain rating scale, medication(s)/side effects and non-pharmacologic comfort measures Outcome: Progressing   Problem: Health Behavior/Discharge Planning: Goal: Ability to manage health-related needs will improve Outcome: Progressing   Problem: Clinical Measurements: Goal: Will remain free from infection Outcome: Progressing   Problem: Clinical Measurements: Goal: Respiratory complications will improve Outcome: Progressing   Problem: Clinical Measurements: Goal: Cardiovascular complication will be avoided Outcome: Progressing   Problem: Nutrition: Goal: Adequate nutrition will be maintained Outcome: Progressing   Problem: Coping: Goal: Level of anxiety will decrease Outcome: Progressing   Problem: Elimination: Goal: Will not experience complications related to bowel motility Outcome: Progressing   Problem: Elimination: Goal: Will not experience complications related to urinary retention Outcome: Progressing   Problem: Safety: Goal: Ability to remain free from injury will improve Outcome: Progressing   Problem: Skin Integrity: Goal: Risk for impaired skin integrity will decrease Outcome: Progressing

## 2019-05-08 LAB — GLUCOSE, CAPILLARY
Glucose-Capillary: 100 mg/dL — ABNORMAL HIGH (ref 70–99)
Glucose-Capillary: 95 mg/dL (ref 70–99)

## 2019-05-08 LAB — CBC
HCT: 33.6 % — ABNORMAL LOW (ref 39.0–52.0)
Hemoglobin: 11 g/dL — ABNORMAL LOW (ref 13.0–17.0)
MCH: 29.4 pg (ref 26.0–34.0)
MCHC: 32.7 g/dL (ref 30.0–36.0)
MCV: 89.8 fL (ref 80.0–100.0)
Platelets: 373 10*3/uL (ref 150–400)
RBC: 3.74 MIL/uL — ABNORMAL LOW (ref 4.22–5.81)
RDW: 14.8 % (ref 11.5–15.5)
WBC: 8.3 10*3/uL (ref 4.0–10.5)
nRBC: 0 % (ref 0.0–0.2)

## 2019-05-08 LAB — COMPREHENSIVE METABOLIC PANEL
ALT: 86 U/L — ABNORMAL HIGH (ref 0–44)
AST: 32 U/L (ref 15–41)
Albumin: 3.3 g/dL — ABNORMAL LOW (ref 3.5–5.0)
Alkaline Phosphatase: 63 U/L (ref 38–126)
Anion gap: 10 (ref 5–15)
BUN: 10 mg/dL (ref 8–23)
CO2: 21 mmol/L — ABNORMAL LOW (ref 22–32)
Calcium: 8.7 mg/dL — ABNORMAL LOW (ref 8.9–10.3)
Chloride: 106 mmol/L (ref 98–111)
Creatinine, Ser: 0.75 mg/dL (ref 0.61–1.24)
GFR calc Af Amer: >60 mL/min (ref >60)
GFR calc non Af Amer: >60 mL/min (ref >60)
Glucose, Bld: 84 mg/dL (ref 70–99)
Potassium: 3.6 mmol/L (ref 3.5–5.1)
Sodium: 137 mmol/L (ref 135–145)
Total Bilirubin: 0.4 mg/dL (ref 0.3–1.2)
Total Protein: 6.6 g/dL (ref 6.5–8.1)

## 2019-05-08 LAB — PROTIME-INR
INR: 1 (ref 0.8–1.2)
Prothrombin Time: 13.5 seconds (ref 11.4–15.2)

## 2019-05-08 LAB — FIBRINOGEN: Fibrinogen: 635 mg/dL — ABNORMAL HIGH (ref 210–475)

## 2019-05-08 MED ORDER — ENSURE ENLIVE PO LIQD
237.0000 mL | Freq: Two times a day (BID) | ORAL | Status: DC
  Administered 2019-05-08: 237 mL via ORAL

## 2019-05-08 NOTE — TOC Progression Note (Addendum)
Transition of Care [facility]) - Progression Note    Patient Details  Name: Dennis Moore MRN: [id number] Date of Birth: 06/12/1956  Transition of [facility]) CM/SW Contact  Loletha Grayer Beverely Pace, RN Phone Number: [phone] (working remotely) 05/08/2019, 10:43 AM  Clinical Narrative:    Patient is a 63 year old with a history of depression, HTN, HLD, and BPH who suffered the onset of cough and fevers 10/6. He tested positive for Covid on 10/8. He presented to the ED on 10/14 when he developed worsening shortness of breath and respiratory distress. His oxygen saturation was in the 80s on room air and improved only to 89% on nonrebreather mask. He was tachypneic in the 30s and 40s.He was then admitted & intubated that same day. Patient was extubated 10/23. Has continued to improve and will likely be discharged on tomorrow. Case manager contacted patient's wife via telephone to discuss discharge needs. CM confirmed home address and wife's contact number. Referral for Home Health called to Adela Lank, [facility] Liaison. Case manager waiting for PT to determine if patient has equipment needs.      Expected Discharge Plan: [place] Barriers to Discharge: No Barriers Identified  Expected Discharge Plan and Services Expected Discharge Plan: [place]   Discharge Planning Services: CM Consult Post Acute Care Choice: [place] arrangements for the past 2 months: Apartment                 DME Arranged: N/A         HH Arranged: PT, OT HH Agency: [place] Date [place]: 05/08/19 Time [place]: 1042 Representative spoke with at [place]: Adela Lank   Social Determinants of Health (SDOH) Interventions    Readmission Risk Interventions No flowsheet data found.

## 2019-05-08 NOTE — Progress Notes (Signed)
Nutrition Follow-up  DOCUMENTATION CODES:   Not applicable  INTERVENTION:   Ensure Enlive po BID, each supplement provides 350 kcal and 20 grams of protein  Encourage PO intake to meet increased needs for recovery   NUTRITION DIAGNOSIS:   Increased nutrient needs related to (COVID PNA) as evidenced by estimated needs. Ongoing.   GOAL:   Patient will meet greater than or equal to 90% of their needs Progressing.   MONITOR:   PO intake, Supplement acceptance  REASON FOR ASSESSMENT:   Consult, Ventilator Enteral/tube feeding initiation and management  ASSESSMENT:   Pt with PMH of depression, HLD, and HTN who recently tested positive for COVID 10/8 now admitted with acute respiratory failure due to COVID+ PNA  Per MD may go home with home PT today if confusion is better.   10/16 d/c'ed prone 10/23 extubated  Meal Completion: 0-100%  Medications reviewed and include: vitamin D, decadron, miralax, vitamin c, zinc,  100 ml free water every 8 hours = 300 ml  IVF: NS @ 125 ml/hr Labs reviewed   Diet Order:   Diet Order            Diet regular Room service appropriate? Yes; Fluid consistency: Thin  Diet effective now              EDUCATION NEEDS:   No education needs have been identified at this time  Skin:  Skin Assessment: Reviewed RN Assessment  Last BM:  10/27  Height:   Ht Readings from Last 1 Encounters:  04/24/19 6' (1.829 m)    Weight:   Wt Readings from Last 1 Encounters:  05/05/19 99.4 kg    Ideal Body Weight:  80.9 kg  BMI:  Body mass index is 29.72 kg/m.  Estimated Nutritional Needs:   Kcal:  2200-2400  Protein:  120-150 grams  Fluid:  2 L/day  Maylon Peppers RD, LDN, CNSC [phone] Pager [phone] After Hours Pager

## 2019-05-08 NOTE — Progress Notes (Signed)
Patient left AMA, no s/s of pain or distress. IV and telemetry removed.

## 2019-05-08 NOTE — Progress Notes (Signed)
[place] for Lovenox Indication: VTE prophylaxis (COVID-PACT trial - prophylaxis dose)   Allergies  Allergen Reactions  . Penicillins Swelling and Rash    Patient Measurements: Height: 6' (182.9 cm) Weight: 219 lb 2.2 oz (99.4 kg) IBW/kg (Calculated) : 77.6  Vital Signs: Temp: 98.4 F (36.9 C) (10/28 0729) Temp Source: Oral (10/28 0729) BP: 133/74 (10/28 0729) Pulse Rate: 59 (10/28 0729)  Labs: Recent Labs    05/06/19 0835 05/07/19 0850 05/08/19 0320  HGB  --  11.9* 11.0*  HCT  --  36.5* 33.6*  PLT  --  316 373  LABPROT 13.9 13.3 13.5  INR 1.1 1.0 1.0  CREATININE 0.71 0.81 0.75    Estimated Creatinine Clearance: 115.4 mL/min (by C-G formula based on SCr of 0.75 mg/dL).   Medical History: Past Medical History:  Diagnosis Date  . Allergic rhinitis   . BPH (benign prostatic hypertrophy)   . Depression   . Erectile dysfunction   . Hyperlipidemia   . Hypertension   . Internal hemorrhoids   . Tubular adenoma of colon 11/2008    Medications:  Medications Prior to Admission  Medication Sig Dispense Refill Last Dose  . albuterol (PROVENTIL HFA;VENTOLIN HFA) 108 (90 Base) MCG/ACT inhaler Inhale 2 puffs into the lungs every 6 (six) hours as needed for wheezing. 54 Inhaler 3 04/24/2019 at Unknown time  . amLODipine (NORVASC) 5 MG tablet Take 5 mg by mouth daily.   04/23/2019  . aspirin EC 81 MG tablet Take 81 mg by mouth daily.   04/23/2019  . atorvastatin (LIPITOR) 80 MG tablet Take 80 mg by mouth daily.   04/23/2019  . [EXPIRED] benzonatate (TESSALON) 200 MG capsule Take 200 mg by mouth 3 (three) times daily as needed for cough.   04/23/2019  . cholecalciferol (VITAMIN D3) 25 MCG (1000 UT) tablet Take 1,000 Units by mouth daily.   04/23/2019  . FLUoxetine (PROZAC) 20 MG capsule Take 20 mg by mouth daily.   04/23/2019  . lisinopril-hydrochlorothiazide (PRINZIDE,ZESTORETIC) 20-12.5 MG tablet Take 2 tablets by mouth daily. 60  tablet 11 04/23/2019  . omeprazole (PRILOSEC) 20 MG capsule Take 20 mg by mouth daily as needed for indigestion.   Past Week at Unknown time  . sildenafil (VIAGRA) 100 MG tablet Take 0.5 tablets (50 mg total) by mouth daily as needed for erectile dysfunction. 10 tablet 0 unk  . vitamin C (ASCORBIC ACID) 500 MG tablet Take 500 mg by mouth daily.   04/23/2019  . zinc gluconate 50 MG tablet Take 50 mg by mouth daily.   04/23/2019  . rosuvastatin (CRESTOR) 20 MG tablet Take 1 tablet (20 mg total) by mouth daily. 30 tablet 5     Assessment: 66 YOM admitted to the ICU with respiratory failure secondary to COVID-19 pneumonia. Pharmacy dosing Lovenox for COVID-PACT regimen for VTE prophylaxis. Patient randomized to the prophylaxis dose arm.  Hgb low but stable at 11, platelets wnl.  CrCl > 30 ml/min, BMI < 35.  Goal of Therapy:  VTE prophylaxis Monitor platelets by anticoagulation protocol: Yes   Plan:  -Continue Lovenox 40 mg SQ daily  -Monitor CBC and renal function   Albertina Parr, PharmD., BCPS Clinical Pharmacist Clinical phone for 05/08/19 until 5pm: [phone]

## 2019-05-08 NOTE — Progress Notes (Signed)
PROGRESS NOTE  Dennis Moore [id number] DOB: 09-29-1955 DOA: 04/24/2019 PCP: Ann Held, DO   LOS: 14 days   Brief Narrative / Interim history: 63 year old male with hypertension, hyperlipidemia, BPH, who was diagnosed with Covid on 10/8, developed shortness of breath and came to the emergency room and was admitted on 10/14, required intubation and admission to the ICU.  He received remdesivir, steroids, Actemra, plasma, and eventually was extubated on 10/23.  ICU stay was complicated by metabolic encephalopathy and agitation requiring Precedex infusion.  He was transferred to the floor on 10/26  Subjective / 24h Interval events: Desperately wants to go home today, feels much better, more alert.  Wife is on FaceTime with patient as I was rounding and she appreciates improvement but still has intermittent confusion  Assessment & Plan: Principal Problem:   Acute respiratory distress syndrome (ARDS) due to COVID-19 virus [facility]) Active Problems:   Essential hypertension   Acute respiratory failure with hypoxia (HCC)   Depression   Acute on chronic respiratory failure with hypoxia (HCC)   VAP (ventilator-associated pneumonia) ([place])   COVID-19   Principal Problem Acute hypoxic respiratory failure/ARDS due to COVID-19 -Initially requiring ICU stay, intubated but able to be extubated on 10/23.  Status post Actemra, convalescent plasma, remdesivir, steroids -Currently on room air  Active Problems Acute metabolic encephalopathy -Secondary to ICU delirium, steroids, improving.  On Seroquel, Prozac, Haldol as needed.  Off Precedex drips now.  Wife tells me he is still confused intermittently but much improved.   Urethral trauma -On after on 10/27, patient pulled out his own Foley.  No further bleeding.  Depression -Continue Prozac  Hypertension -Stable   Scheduled Meds: . aspirin  81 mg Oral Daily  . bethanechol  10 mg Oral QID  . chlorhexidine  15 mL Mouth/Throat  BID  . Chlorhexidine Gluconate Cloth  6 each Topical Daily  . cloNIDine  0.1 mg Oral TID  . enoxaparin (LOVENOX) injection  40 mg Subcutaneous Q24H  . feeding supplement (ENSURE ENLIVE)  237 mL Oral BID BM  . FLUoxetine  20 mg Oral Daily  . insulin aspart  0-5 Units Subcutaneous QHS  . insulin aspart  0-9 Units Subcutaneous TID WC  . QUEtiapine  50 mg Oral BID  . tamsulosin  0.4 mg Oral BID   Continuous Infusions: . dexmedetomidine (PRECEDEX) IV infusion Stopped (05/06/19 1032)   PRN Meds:.acetaminophen, haloperidol lactate, Ipratropium-Albuterol, loperamide, menthol-cetylpyridinium, ondansetron **OR** ondansetron (ZOFRAN) IV  DVT prophylaxis: COVID PACT study  Code Status: Full code Family Communication: d/w wife over the phone  Disposition Plan: home in 1 day  Consultants:  None   Procedures:  None, on vent 10/14 - 10/23  Microbiology  None   Antimicrobials: Levaquin 10/14 Vancomycin 10/15 > 10/18 Rocephin 10/18 > 10/19    Objective: Vitals:   05/07/19 2215 05/08/19 0005 05/08/19 0550 05/08/19 0729  BP: (!) 148/73 (!) 126/58 131/72 133/74  Pulse:  67  (!) 59  Resp:  (!) 27 (!) 28 (!) 26  Temp:  98.3 F (36.8 C) 98.3 F (36.8 C) 98.4 F (36.9 C)  TempSrc:  Oral Oral Oral  SpO2:  95% 94% 97%  Weight:      Height:        Intake/Output Summary (Last 24 hours) at 05/08/2019 1041 Last data filed at 05/07/2019 2210 Gross per 24 hour  Intake 1100 ml  Output 560 ml  Net 540 ml   Filed Weights   05/01/19 0500 05/02/19 0500  05/05/19 0500  Weight: 93.3 kg 50 kg 99.4 kg    Examination:  Constitutional: NAD Eyes:  lids and conjunctivae normal ENMT: Mucous membranes are moist. Respiratory: clear to auscultation bilaterally, no wheezing, no crackles. Normal respiratory effort.  Cardiovascular: Regular rate and rhythm, no murmurs / rubs / gallops. No LE edema.  Abdomen: no tenderness. Bowel sounds positive.  Musculoskeletal: no clubbing / cyanosis Skin: no  rashes Neurologic: CN 2-12 grossly intact. Strength 5/5 in all 4.  Psychiatric: Normal judgment and insight. Slightly anxious / tremulous   Data Reviewed: I have independently reviewed following labs and imaging studies   CBC: Recent Labs  Lab 05/02/19 0525  05/03/19 0543 05/03/19 1424 05/04/19 0425 05/05/19 0523 05/07/19 0850 05/08/19 0320  WBC 16.8*  --  17.7*  --  15.5* 11.0* 8.6 8.3  NEUTROABS 12.7*  --  13.2*  --   --   --   --   --   HGB 11.7*   < > 12.1* 12.9* 11.3* 11.3* 11.9* 11.0*  HCT 36.2*   < > 36.9* 38.0* 34.4* 34.6* 36.5* 33.6*  MCV 91.6  --  91.3  --  90.8 90.8 92.2 89.8  PLT 465*  --  418*  --  389 369 316 373   < > = values in this interval not displayed.   Basic Metabolic Panel: Recent Labs  Lab 05/05/19 0523 05/05/19 1450 05/06/19 0835 05/07/19 0850 05/08/19 0320  NA 137 135 138 136 137  K 2.8* 3.3* 3.6 3.8 3.6  CL 101 102 107 106 106  CO2 24 21* 21* 20* 21*  GLUCOSE 109* 121* 125* 95 84  BUN 25* 21 16 12 10   CREATININE 0.85 0.87 0.71 0.81 0.75  CALCIUM 8.7* 8.8* 8.6* 8.6* 8.7*  MG 2.1  --  2.4  --   --    GFR: Estimated Creatinine Clearance: 115.4 mL/min (by C-G formula based on SCr of 0.75 mg/dL). Liver Function Tests: Recent Labs  Lab 05/03/19 0543 05/04/19 0425 05/06/19 0835 05/07/19 0850 05/08/19 0320  AST 79* 62* 49* 37 32  ALT 202* 169* 127* 108* 86*  ALKPHOS 70 58 67 69 63  BILITOT 0.5 1.0 0.4 0.8 0.4  PROT 7.6 7.1 6.7 6.4* 6.6  ALBUMIN 3.1* 3.1* 3.1* 3.1* 3.3*   No results for input(s): LIPASE, AMYLASE in the last 168 hours. No results for input(s): AMMONIA in the last 168 hours. Coagulation Profile: Recent Labs  Lab 05/04/19 0425 05/05/19 0523 05/06/19 0835 05/07/19 0850 05/08/19 0320  INR 1.1 1.1 1.1 1.0 1.0   Cardiac Enzymes: No results for input(s): CKTOTAL, CKMB, CKMBINDEX, TROPONINI in the last 168 hours. BNP (last 3 results) No results for input(s): PROBNP in the last 8760 hours. HbA1C: No results for  input(s): HGBA1C in the last 72 hours. CBG: Recent Labs  Lab 05/07/19 0801 05/07/19 1128 05/07/19 1611 05/07/19 2225 05/08/19 0726  GLUCAP 89 78 90 147* 100*   Lipid Profile: No results for input(s): CHOL, HDL, LDLCALC, TRIG, CHOLHDL, LDLDIRECT in the last 72 hours. Thyroid Function Tests: No results for input(s): TSH, T4TOTAL, FREET4, T3FREE, THYROIDAB in the last 72 hours. Anemia Panel: Recent Labs    05/06/19 0835  VITAMINB12 1,046*  FOLATE 15.1   Urine analysis:    Component Value Date/Time   COLORURINE YELLOW 01/05/2010 2000   APPEARANCEUR CLEAR 01/05/2010 2000   LABSPEC 1.027 01/05/2010 2000   PHURINE 8.5 (H) 01/05/2010 2000   GLUCOSEU NEGATIVE 01/05/2010 2000   HGBUR NEGATIVE 01/05/2010  2000   HGBUR negative 12/04/2008 0802   BILIRUBINUR neg 08/25/2015 1206   [place] 01/05/2010 2000   PROTEINUR neg 08/25/2015 1206   PROTEINUR 30 (A) 01/05/2010 2000   UROBILINOGEN 0.2 08/25/2015 1206   UROBILINOGEN 1.0 01/05/2010 2000   NITRITE neg 08/25/2015 1206   NITRITE NEGATIVE 01/05/2010 2000   LEUKOCYTESUR Negative 08/25/2015 1206   Sepsis Labs: Invalid input(s): PROCALCITONIN, LACTICIDVEN  No results found for this or any previous visit (from the past 240 hour(s)).    Radiology Studies: Vas [place] Lower Extremity Venous (dvt)  Result Date: 05/07/2019  Lower Venous Study Indications: Covid positive.  Limitations: Patient noncompliant, severe, intermittent agitation post extubation. Comparison Study: No prior study on file for comparison Performing Technologist: Sharion Dove RVS  Examination Guidelines: A complete evaluation includes B-mode imaging, spectral Doppler, color Doppler, and power Doppler as needed of all accessible portions of each vessel. Bilateral testing is considered an integral part of a complete examination. Limited examinations for reoccurring indications may be performed as noted.   +---------+---------------+---------+-----------+----------+-------------------+ RIGHT    CompressibilityPhasicitySpontaneityPropertiesThrombus Aging      +---------+---------------+---------+-----------+----------+-------------------+ CFV      Full           Yes      Yes                                      +---------+---------------+---------+-----------+----------+-------------------+ SFJ      Full                                                             +---------+---------------+---------+-----------+----------+-------------------+ FV Prox  Full                                                             +---------+---------------+---------+-----------+----------+-------------------+ FV Mid   Full                                                             +---------+---------------+---------+-----------+----------+-------------------+ FV DistalFull                                                             +---------+---------------+---------+-----------+----------+-------------------+ PFV                     Yes      Yes                  Patent by color and  Doppler             +---------+---------------+---------+-----------+----------+-------------------+ PTV      Full                                                             +---------+---------------+---------+-----------+----------+-------------------+ PERO     Full                                                             +---------+---------------+---------+-----------+----------+-------------------+   +---------+---------------+---------+-----------+----------+-------------------+ LEFT     CompressibilityPhasicitySpontaneityPropertiesThrombus Aging      +---------+---------------+---------+-----------+----------+-------------------+ CFV      Full           Yes      Yes                                       +---------+---------------+---------+-----------+----------+-------------------+ SFJ      Full                                                             +---------+---------------+---------+-----------+----------+-------------------+ FV Prox  Full                                                             +---------+---------------+---------+-----------+----------+-------------------+ FV Mid   Full                                                             +---------+---------------+---------+-----------+----------+-------------------+ FV DistalFull                                                             +---------+---------------+---------+-----------+----------+-------------------+ PFV      Full                                                             +---------+---------------+---------+-----------+----------+-------------------+ POP      Full           Yes      Yes                                      +---------+---------------+---------+-----------+----------+-------------------+  PTV                     Yes      Yes                  patent by color and                                                       Doppler             +---------+---------------+---------+-----------+----------+-------------------+ PERO                                                  Not visualized      +---------+---------------+---------+-----------+----------+-------------------+   Left Technical Findings: Not visualized segments include peroneal.   Summary: Right: There is no evidence of deep vein thrombosis in the lower extremity. However, portions of this examination were limited- see technologist comments above. Left: There is no evidence of deep vein thrombosis in the lower extremity. However, portions of this examination were limited- see technologist comments above.  *See table(s) above for measurements and observations. Electronically signed by  Deitra Mayo MD on 05/07/2019 at 3:10:11 PM.    Final     Marzetta Board, MD, PhD [facility]  Contact via  [email or web address]  [place] P: [phone] F: [phone]

## 2019-05-08 NOTE — Progress Notes (Signed)
Physical Therapy Treatment Patient Details Name: Dennis Moore MRN: [id number] DOB: 03/11/1956 Today's Date: 05/08/2019    History of Present Illness HPI: 63 year old with a history of depression, HTN, HLD, and BPH who suffered the onset of cough and fevers 10/6.  He tested positive for Covid on 10/8.  He presented to the ED when he developed worsening shortness of breath and respiratory distress.  His oxygen saturation was in the 80s on room air and improved only to 89% on nonrebreather mask.  He was tachypneic in the 30s and 40s.  Chest x-ray confirmed multifocal bilateral infiltrates. ETT 10/14-23.    PT Comments    Much better tolerance to tx this day. Pt states is getting ready to go home, Cognition also improved. Was able to get from recliner to bed with mod I and was able to ambulate in room approx 138ft with RW and SBA-min guard assist. Pt was on room air the entire time and was able to maintain sats in 90s. Measured via finger probe.    Follow Up Recommendations  Home health PT     Equipment Recommendations  Rolling walker with 5" wheels    Recommendations for Other Services       Precautions / Restrictions Precautions Precautions: Fall;Other (comment)(some cognition deficits also noted) Precaution Comments: decreased safety, Restrictions Weight Bearing Restrictions: No    Mobility  Bed Mobility Overal bed mobility: Modified Independent Bed Mobility: Supine to Sit;Sit to Supine Rolling: Modified independent (Device/Increase time)   Supine to sit: Modified independent (Device/Increase time) Sit to supine: Modified independent (Device/Increase time)      Transfers Overall transfer level: Needs assistance Equipment used: Rolling walker (2 wheeled)   Sit to Stand: Supervision;Min guard            Ambulation/Gait Ambulation/Gait assistance: Supervision;Min guard Gait Distance (Feet): 180 Feet Assistive device: Rolling walker (2 wheeled) Gait  Pattern/deviations: Step-through pattern     General Gait Details: 1 LOB noted but pt was able to self correct w/o therapist assitance   Stairs             Wheelchair Mobility    Modified Rankin (Stroke Patients Only)       Balance Overall balance assessment: Needs assistance Sitting-balance support: Bilateral upper extremity supported;Feet unsupported Sitting balance-Leahy Scale: Good     Standing balance support: During functional activity;Bilateral upper extremity supported Standing balance-Leahy Scale: Fair                              Cognition Arousal/Alertness: Awake/alert Behavior During Therapy: Restless Overall Cognitive Status: Impaired/Different from baseline Area of Impairment: Safety/judgement;Awareness;Problem solving                 Orientation Level: Person;Place;Time;Situation Current Attention Level: Sustained(awaiting phone call from physician impatiently) Memory: Decreased recall of precautions Following Commands: Follows multi-step commands consistently Safety/Judgement: Decreased awareness of safety;Decreased awareness of deficits   Problem Solving: Slow processing General Comments: Pt is making preparations for going home, is awaiting phone call from physicians office. He is agreeable to tx with therapist to determine needed DME for d/c home.      Exercises      General Comments General comments (skin integrity, edema, etc.): Overall doing much better with mobillity, needing slightly decreased assistance and also able to tolerate more actiity. Pt able to ambulate in room approx 145ft with RW and CGA-SBA, on room air and sats remain in high 90s.  Pertinent Vitals/Pain Pain Assessment: No/denies pain    Home Living                      Prior Function            PT Goals (current goals can now be found in the care plan section) Acute Rehab PT Goals Patient Stated Goal: agreeable to ambulation in room  as will be d/c home soon Potential to Achieve Goals: Good Progress towards PT goals: Progressing toward goals    Frequency    Min 3X/week      PT Plan Current plan remains appropriate    Co-evaluation              AM-PAC PT "6 Clicks" Mobility   Outcome Measure  Help needed turning from your back to your side while in a flat bed without using bedrails?: None Help needed moving from lying on your back to sitting on the side of a flat bed without using bedrails?: None Help needed moving to and from a bed to a chair (including a wheelchair)?: A Little Help needed standing up from a chair using your arms (e.g., wheelchair or bedside chair)?: A Little Help needed to walk in hospital room?: A Little Help needed climbing 3-5 steps with a railing? : A Lot 6 Click Score: 19    End of Session   Activity Tolerance: Patient tolerated treatment well Patient left: in chair;with call bell/phone within reach Nurse Communication: Mobility status PT Visit Diagnosis: Unsteadiness on feet (R26.81);Muscle weakness (generalized) (M62.81);Difficulty in walking, not elsewhere classified (R26.2)     Time: 1051-1110 PT Time Calculation (min) (ACUTE ONLY): 19 min  Charges:  $Gait Training: 8-22 mins                     Horald Chestnut, PT    [place]elf[place] Field 05/08/2019, 1:36 PM

## 2019-05-08 NOTE — Discharge Summary (Addendum)
Physician [place] Discharge Summary  Dennis Moore [id number] DOB: 1956/02/04 DOA: 04/24/2019  PCP: Ann Held, DO  Admit date: 04/24/2019 Discharge date: 05/08/2019  Admitted From: home Disposition:  home  Recommendations for Outpatient Follow-up:  1. Follow up with PCP in 1-2 weeks  Home Health: PT, OT  Discharge Condition: guarded  CODE STATUS: Full code Diet recommendation: regular  HPI: Per admitting MD, Dennis Moore is a 63 y.o. male with medical history significant of HTN.  From PCP records I can see that the patient called 10/7 with fevers and cough onset 10/6.  He was tested for COVID on 10/8 and the test came back positive (RT PCR, result visible to [place] under Care Everywhere > Lab results [place] > Viral tests). Patient presented to the ED with worsening SOB, respiratory distress.  Was satting 73s on RA Per EDP note: "Has had progressively worsening cough, now with shortness of breath for the past 2 or 3 days. Denies chest pain. Intermittent fevers. No abdominal pain. No vomiting, no diarrhea. Symptoms constant, moderate in severity, no exacerbating or alleviating factors." ED Course: Sats were still 89% on NRB, tachypnic with RR 30-40s.  CXR showing multifocal bilateral PNA c/w COVID EDP made decision to intubate patient and patient was intubated. Hospitalist has been called to admit patient over to [facility] and PCCM has been consulted.  Hospital Course: Principal Problem Acute hypoxic respiratory failure/ARDS due to COVID-19 -Initially requiring ICU stay, intubated but able to be extubated on 10/23.  Status post Actemra, convalescent plasma, remdesivir, steroids. Currently on room air and stable  Active Problems  Acute metabolic encephalopathy -Secondary to ICU delirium, steroids, improving.  On Seroquel, Prozac, Haldol as needed.  Off Precedex drips now.  Wife tells me he is still confused intermittently but much improved however she  mentions that patient is not yet at baseline, therefore I recommended monitoring in the hospital setting however patient wishes to be discharged. This will have to be AMA discharge given not returned to baseline reported by his wife. He answered orientation questions appropriately this morning.  Urethral trauma -On after on 10/27, patient pulled out his own Foley.  No further bleeding. Depression -Continue home medications  Hypertension -Stable  Discharge Diagnoses:  Principal Problem:   Acute respiratory distress syndrome (ARDS) due to COVID-19 virus [facility]) Active Problems:   Essential hypertension   Acute respiratory failure with hypoxia (HCC)   Depression   Acute on chronic respiratory failure with hypoxia (HCC)   VAP (ventilator-associated pneumonia) ([place])   COVID-19     Discharge Instructions   Allergies as of 05/08/2019      Reactions   Penicillins Swelling, Rash    Med Rec must be completed prior to using this [facility]      Follow-up [place] Follow up.   Specialty: [place] Why: A representative from [facility] will contact you to arrange start date and time for your therapy. Contact information: [address] [place] [address] [phone]           Consultations:  PCCM  Procedures/Studies:  Dg Chest Port 1 View  Result Date: 04/30/2019 CLINICAL DATA:  Respiratory failure. EXAM: PORTABLE CHEST 1 VIEW COMPARISON:  Chest x-ray 04/27/2019. FINDINGS: Endotracheal tube and NG tube in stable position. Stable cardiomegaly. Diffuse bilateral pulmonary infiltrates/edema again noted. Slight improvement from prior exam. Progressive left base atelectasis. Left pleural effusion cannot be excluded.  IMPRESSION: 1.  Endotracheal tube and NG tube in stable position. 2.  Stable cardiomegaly. 3. Diffuse bilateral pulmonary infiltrates/edema again noted. Slight improvement from prior exam. Progressive left  base atelectasis. Left pleural effusion cannot be excluded. Electronically Signed   By: Marcello Moores  Register   On: 04/30/2019 07:24   Dg Chest Port 1 View  Result Date: 04/27/2019 CLINICAL DATA:  Follow-up coronavirus pneumonia. EXAM: PORTABLE CHEST 1 VIEW COMPARISON:  04/25/2019 FINDINGS: Endotracheal tube tip is 2 cm above the carina. Orogastric or nasogastric tube enters the abdomen. Widespread bilateral pulmonary infiltrates persist, similar to the previous study. No worsening or new finding. IMPRESSION: 1. Endotracheal tube 2 cm above the carina. 2. No change in bilateral pulmonary infiltrates. Electronically Signed   By: Nelson Chimes M.D.   On: 04/27/2019 10:21   Dg Chest Port 1 View  Result Date: 04/25/2019 CLINICAL DATA:  COVID-19 positive patient who is intubated. Repositioning of endotracheal tube. EXAM: PORTABLE CHEST 1 VIEW COMPARISON:  Single-view of the chest 04/23/2014. FINDINGS: Endotracheal tube has been pulled back with the tip now in good position at the level of the clavicular heads. NG tube courses into the stomach and below the inferior margin of the film. Extensive bilateral airspace disease has worsened. Heart size is upper normal. No pneumothorax. IMPRESSION: ETT is in good position. Worsened multifocal pneumonia. Electronically Signed   By: Inge Rise M.D.   On: 04/25/2019 11:29   Dg Chest Port 1 View  Addendum Date: 04/24/2019   ADDENDUM REPORT: 04/24/2019 22:52 ADDENDUM: ETT position discussed by telephone with Dr. Gerlene Fee on 04/24/2019 at 22:50 hours. Electronically Signed   By: Genevie Ann M.D.   On: 04/24/2019 22:52   Result Date: 04/24/2019 CLINICAL DATA:  63 year old male intubated. Positive for COVID-19. EXAM: PORTABLE CHEST 1 VIEW COMPARISON:  1830 hours today and earlier. FINDINGS: Portable AP supine view at 2236 hours. Endotracheal tube tip in place and just inside the right mainstem bronchus. Retraction of 2-3 centimeters should allow for optimal placement.  Increased retrocardiac opacity and air bronchograms probably in part atelectasis. Patchy and indistinct multifocal bilateral pulmonary opacity elsewhere persist. No pleural effusion or pneumothorax. Stable cardiac size and mediastinal contours. IMPRESSION: 1. Right mainstem bronchus intubation. Retraction of 2-3 cm should allow for optimal ETT placement. 2. Associated left lower lobe atelectasis. Otherwise stable bilateral COVID-19 pneumonia Electronically Signed: By: Genevie Ann M.D. On: 04/24/2019 22:48   Dg Chest Portable 1 View  Result Date: 04/24/2019 CLINICAL DATA:  Shortness of breath, COVID-19 positive EXAM: PORTABLE CHEST 1 VIEW COMPARISON:  02/10/2014 FINDINGS: Multifocal bilateral interstitial and ground-glass opacity. No pleural effusion. Mild cardiomegaly. No pneumothorax. IMPRESSION: Multifocal bilateral interstitial and ground-glass opacity, felt consistent with pneumonia and provided clinical history. Electronically Signed   By: Donavan Foil M.D.   On: 04/24/2019 19:02   Vas [place] Lower Extremity Venous (dvt)  Result Date: 05/07/2019  Lower Venous Study Indications: Covid positive.  Limitations: Patient noncompliant, severe, intermittent agitation post extubation. Comparison Study: No prior study on file for comparison Performing Technologist: Sharion Dove RVS  Examination Guidelines: A complete evaluation includes B-mode imaging, spectral Doppler, color Doppler, and power Doppler as needed of all accessible portions of each vessel. Bilateral testing is considered an integral part of a complete examination. Limited examinations for reoccurring indications may be performed as noted.  +---------+---------------+---------+-----------+----------+-------------------+  RIGHT     Compressibility Phasicity Spontaneity Properties Thrombus Aging       +---------+---------------+---------+-----------+----------+-------------------+  CFV  Full            Yes       Yes                                          +---------+---------------+---------+-----------+----------+-------------------+  SFJ       Full                                                                  +---------+---------------+---------+-----------+----------+-------------------+  FV Prox   Full                                                                  +---------+---------------+---------+-----------+----------+-------------------+  FV Mid    Full                                                                  +---------+---------------+---------+-----------+----------+-------------------+  FV Distal Full                                                                  +---------+---------------+---------+-----------+----------+-------------------+  PFV                       Yes       Yes                    Patent by color and                                                              Doppler              +---------+---------------+---------+-----------+----------+-------------------+  PTV       Full                                                                  +---------+---------------+---------+-----------+----------+-------------------+  PERO      Full                                                                  +---------+---------------+---------+-----------+----------+-------------------+   +---------+---------------+---------+-----------+----------+-------------------+  LEFT      Compressibility Phasicity Spontaneity Properties Thrombus Aging       +---------+---------------+---------+-----------+----------+-------------------+  CFV       Full            Yes       Yes                                         +---------+---------------+---------+-----------+----------+-------------------+  SFJ       Full                                                                  +---------+---------------+---------+-----------+----------+-------------------+  FV Prox   Full                                                                   +---------+---------------+---------+-----------+----------+-------------------+  FV Mid    Full                                                                  +---------+---------------+---------+-----------+----------+-------------------+  FV Distal Full                                                                  +---------+---------------+---------+-----------+----------+-------------------+  PFV       Full                                                                  +---------+---------------+---------+-----------+----------+-------------------+  POP       Full            Yes       Yes                                         +---------+---------------+---------+-----------+----------+-------------------+  PTV                       Yes       Yes                    patent by color and  Doppler              +---------+---------------+---------+-----------+----------+-------------------+  PERO                                                       Not visualized       +---------+---------------+---------+-----------+----------+-------------------+   Left Technical Findings: Not visualized segments include peroneal.   Summary: Right: There is no evidence of deep vein thrombosis in the lower extremity. However, portions of this examination were limited- see technologist comments above. Left: There is no evidence of deep vein thrombosis in the lower extremity. However, portions of this examination were limited- see technologist comments above.  *See table(s) above for measurements and observations. Electronically signed by Deitra Mayo MD on 05/07/2019 at 3:10:11 PM.    Final       Subjective: - no chest pain, shortness of breath, no abdominal pain, nausea or vomiting. Appears tremulous  Discharge Exam: BP (!) 153/70 (BP Location: Right Arm)    Pulse 78    Temp 98.2 F (36.8 C) (Oral)    Resp 19    Ht 6' (1.829 m)    Wt 99.4 kg     SpO2 97%    BMI 29.72 kg/m   General: Pt is alert, awake, not in acute distress Cardiovascular: RRR, S1/S2 +, no rubs, no gallops Respiratory: CTA bilaterally, no wheezing, no rhonchi Abdominal: Soft, NT, ND, bowel sounds + Extremities: no edema, no cyanosis    The results of significant diagnostics from this hospitalization (including imaging, microbiology, ancillary and laboratory) are listed below for reference.     Microbiology: No results found for this or any previous visit (from the past 240 hour(s)).   Labs: BNP (last 3 results) No results for input(s): BNP in the last 8760 hours. Basic Metabolic Panel: Recent Labs  Lab 05/05/19 0523 05/05/19 1450 05/06/19 0835 05/07/19 0850 05/08/19 0320  NA 137 135 138 136 137  K 2.8* 3.3* 3.6 3.8 3.6  CL 101 102 107 106 106  CO2 24 21* 21* 20* 21*  GLUCOSE 109* 121* 125* 95 84  BUN 25* 21 16 12 10   CREATININE 0.85 0.87 0.71 0.81 0.75  CALCIUM 8.7* 8.8* 8.6* 8.6* 8.7*  MG 2.1  --  2.4  --   --    Liver Function Tests: Recent Labs  Lab 05/03/19 0543 05/04/19 0425 05/06/19 0835 05/07/19 0850 05/08/19 0320  AST 79* 62* 49* 37 32  ALT 202* 169* 127* 108* 86*  ALKPHOS 70 58 67 69 63  BILITOT 0.5 1.0 0.4 0.8 0.4  PROT 7.6 7.1 6.7 6.4* 6.6  ALBUMIN 3.1* 3.1* 3.1* 3.1* 3.3*   No results for input(s): LIPASE, AMYLASE in the last 168 hours. No results for input(s): AMMONIA in the last 168 hours. CBC: Recent Labs  Lab 05/02/19 0525  05/03/19 0543 05/03/19 1424 05/04/19 0425 05/05/19 0523 05/07/19 0850 05/08/19 0320  WBC 16.8*  --  17.7*  --  15.5* 11.0* 8.6 8.3  NEUTROABS 12.7*  --  13.2*  --   --   --   --   --   HGB 11.7*   < > 12.1* 12.9* 11.3* 11.3* 11.9* 11.0*  HCT 36.2*   < > 36.9* 38.0* 34.4* 34.6* 36.5* 33.6*  MCV 91.6  --  91.3  --  90.8 90.8  92.2 89.8  PLT 465*  --  418*  --  389 369 316 373   < > = values in this interval not displayed.   Cardiac Enzymes: No results for input(s): CKTOTAL, CKMB,  CKMBINDEX, TROPONINI in the last 168 hours. BNP: Invalid input(s): POCBNP CBG: Recent Labs  Lab 05/07/19 1128 05/07/19 1611 05/07/19 2225 05/08/19 0726 05/08/19 1133  GLUCAP 78 90 147* 100* 95   D-Dimer No results for input(s): DDIMER in the last 72 hours. Hgb A1c No results for input(s): HGBA1C in the last 72 hours. Lipid Profile No results for input(s): CHOL, HDL, LDLCALC, TRIG, CHOLHDL, LDLDIRECT in the last 72 hours. Thyroid function studies No results for input(s): TSH, T4TOTAL, T3FREE, THYROIDAB in the last 72 hours.  Invalid input(s): FREET3 Anemia work up Recent Labs    05/06/19 0835  VITAMINB12 1,046*  FOLATE 15.1   Urinalysis    Component Value Date/Time   COLORURINE YELLOW 01/05/2010 2000   APPEARANCEUR CLEAR 01/05/2010 2000   LABSPEC 1.027 01/05/2010 2000   PHURINE 8.5 (H) 01/05/2010 2000   GLUCOSEU NEGATIVE 01/05/2010 2000   HGBUR NEGATIVE 01/05/2010 2000   HGBUR negative 12/04/2008 0802   BILIRUBINUR neg 08/25/2015 1206   [place] 01/05/2010 2000   PROTEINUR neg 08/25/2015 1206   PROTEINUR 30 (A) 01/05/2010 2000   UROBILINOGEN 0.2 08/25/2015 1206   UROBILINOGEN 1.0 01/05/2010 2000   NITRITE neg 08/25/2015 1206   NITRITE NEGATIVE 01/05/2010 2000   LEUKOCYTESUR Negative 08/25/2015 1206   Sepsis Labs Invalid input(s): PROCALCITONIN,  WBC,  LACTICIDVEN  FURTHER DISCHARGE INSTRUCTIONS:   Get Medicines reviewed and adjusted: Please take all your medications with you for your next visit with your Primary MD   Laboratory/radiological data: Please request your Primary MD to go over all hospital tests and procedure/radiological results at the follow up, please ask your Primary MD to get all Hospital records sent to his/her office.   In some cases, they will be blood work, cultures and biopsy results pending at the time of your discharge. Please request that your primary care M.D. goes through all the records of your hospital data and follows  up on these results.   Also Note the following: If you experience worsening of your admission symptoms, develop shortness of breath, life threatening emergency, suicidal or homicidal thoughts you must seek medical attention immediately by calling 911 or calling your MD immediately  if symptoms less severe.   You must read complete instructions/literature along with all the possible adverse reactions/side effects for all the Medicines you take and that have been prescribed to you. Take any new Medicines after you have completely understood and accpet all the possible adverse reactions/side effects.    Do not drive when taking Pain medications or sleeping medications (Benzodaizepines)   Do not take more than prescribed Pain, Sleep and Anxiety Medications. It is not advisable to combine anxiety,sleep and pain medications without talking with your primary care practitioner   Special Instructions: If you have smoked or chewed Tobacco  in the last 2 yrs please stop smoking, stop any regular Alcohol  and or any Recreational drug use.   Wear Seat belts while driving.   Please note: You were cared for by a hospitalist during your hospital stay. Once you are discharged, your primary care physician will handle any further medical issues. Please note that NO REFILLS for any discharge medications will be authorized once you are discharged, as it is imperative that you return to your primary care  physician (or establish a relationship with a primary care physician if you do not have one) for your post hospital discharge needs so that they can reassess your need for medications and monitor your lab values.  Time coordinating discharge: 15 minutes  SIGNED:  Marzetta Board, MD, PhD 05/08/2019, 1:33 PM

## 2019-05-08 NOTE — Care Management (Signed)
Case manager was notified that patient left prior to receiving his DME. He was scheduled to be discharged on tomorrow but went AMA.    Ricki Miller, RN BSN Case Manager  [phone]

## 2019-05-08 NOTE — Care Management Important Message (Signed)
Important Message  Patient Details  Name: RYANJOSEPH WRICE MRN: [id number] Date of Birth: 02/11/1956   Medicare Important Message Given:  Yes - Important Message mailed due to current National Emergency  Verbal consent obtained due to current National Emergency  Relationship to patient: Spouse/Significant Other Contact Name: Doris Head Call Date: 05/08/19  Time: 1220 Phone: [phone] Outcome: Spoke with contact Important Message mailed to: Other (must enter comment)(emailed to [email or web address])    Hadiya Spoerl P Brandell Maready 05/08/2019, 1:14 PM

## 2019-05-08 NOTE — Discharge Instructions (Signed)

## 2019-05-09 ENCOUNTER — Telehealth: Payer: Self-pay | Admitting: *Deleted

## 2019-05-09 NOTE — Care Management (Signed)
05/09/19 11:00 Case manager received call from [facility] stating patient was in need of his DME. As noted patient left on yesterday prior to orders being entered or equipment being delivered. Case manager contacted patient's wife and she will come to [facility] will meet her with RW and [facility]in[employer].  Case manager notified Learta Codding with [facility] of DME need.   Ricki Miller, RN BSN Case Manager [phone]

## 2019-05-09 NOTE — Telephone Encounter (Signed)
Called pt to do hospital follow up. Pt states he is doing ok. Reports he is still weak. Pt states he is currently taking a nap, wants to schedule follow up appt but does not feel up to answering questions. Appt scheduled w/ PCP 05/10/19 virtually.

## 2019-05-10 ENCOUNTER — Other Ambulatory Visit: Payer: Self-pay

## 2019-05-10 ENCOUNTER — Ambulatory Visit (INDEPENDENT_AMBULATORY_CARE_PROVIDER_SITE_OTHER): Payer: 59 | Admitting: Family Medicine

## 2019-05-10 ENCOUNTER — Encounter: Payer: Self-pay | Admitting: Family Medicine

## 2019-05-10 VITALS — BP 106/84 | HR 82 | Temp 97.4°F | Ht 72.0 in | Wt 208.0 lb

## 2019-05-10 DIAGNOSIS — U071 COVID-19: Secondary | ICD-10-CM | POA: Diagnosis not present

## 2019-05-10 DIAGNOSIS — E785 Hyperlipidemia, unspecified: Secondary | ICD-10-CM | POA: Diagnosis not present

## 2019-05-10 DIAGNOSIS — I10 Essential (primary) hypertension: Secondary | ICD-10-CM

## 2019-05-10 NOTE — Assessment & Plan Note (Signed)
Tolerating statin, encouraged heart healthy diet, avoid trans fats, minimize simple carbs and saturated fats. Increase exercise as tolerated 

## 2019-05-10 NOTE — Progress Notes (Signed)
Virtual Visit via Video Note  I connected with Dennis Moore on 05/10/19 at  3:20 PM EDT by a video enabled telemedicine application and verified that I am speaking with the correct person using two identifiers.  Location: Patient: home Provider: office    I discussed the limitations of evaluation and management by telemedicine and the availability of in person appointments. The patient expressed understanding and agreed to proceed.  History of Present Illness: Pt is in the hosp 10/14--10/28.  He tested positive for covid 10/9 and progressively got sob and went to er.   Pt was on the vent in ICU -- he was extubated 10/23 and progressively improved  He left [facility] on 10/28   He is feeling better -- still get sob at times  No fevers    Observations/Objective: Vitals:   05/10/19 1530  BP: 106/84  Pulse: 82  Temp: (!) 97.4 F (36.3 C)  SpO2: 98%   Pt is in NAD  No sob   Assessment and Plan: 1. COVID-19 virus infection . Pt feeling better-- con't to improve F/u 74months or sooner prn  2. Essential hypertension Well controlled, no changes to meds. Encouraged heart healthy diet such as the DASH diet and exercise as tolerated.   3. Hyperlipidemia, unspecified hyperlipidemia type Tolerating statin, encouraged heart healthy diet, avoid trans fats, minimize simple carbs and saturated fats. Increase exercise as tolerated  Follow Up Instructions:    I discussed the assessment and treatment plan with the patient. The patient was provided an opportunity to ask questions and all were answered. The patient agreed with the plan and demonstrated an understanding of the instructions.   The patient was advised to call back or seek an in-person evaluation if the symptoms worsen or if the condition fails to improve as anticipated.  I provided 15 minutes of non-face-to-face time during this encounter.   Ann Held, DO

## 2019-05-10 NOTE — Assessment & Plan Note (Signed)
Well controlled, no changes to meds. Encouraged heart healthy diet such as the DASH diet and exercise as tolerated.  °

## 2019-05-15 ENCOUNTER — Other Ambulatory Visit: Payer: Self-pay

## 2019-05-15 DIAGNOSIS — Z20822 Contact with and (suspected) exposure to covid-19: Secondary | ICD-10-CM

## 2019-05-16 LAB — NOVEL CORONAVIRUS, NAA: SARS-CoV-2, NAA: NOT DETECTED

## 2019-05-17 ENCOUNTER — Telehealth: Payer: Self-pay | Admitting: Family Medicine

## 2019-05-17 ENCOUNTER — Other Ambulatory Visit: Payer: Self-pay | Admitting: Family Medicine

## 2019-05-17 DIAGNOSIS — R490 Dysphonia: Secondary | ICD-10-CM

## 2019-05-17 NOTE — Telephone Encounter (Signed)
Caller name: Lorriane Shire  Relation to pt: Astronomer from [place]  Call back number: [phone]   Reason for call:  Patient was intubated due to being dx with COVID, therapist would like to wait until we refer to ENT to give patient some time to recovery on his own. Patient is a Art gallery manager.

## 2019-05-17 NOTE — Telephone Encounter (Signed)
Referral in

## 2019-05-17 NOTE — Telephone Encounter (Signed)
Spoke with Lorriane Shire and confirmed ENT referral was placed.

## 2019-05-17 NOTE — Progress Notes (Signed)
e

## 2019-05-17 NOTE — Telephone Encounter (Signed)
FYI and was ENT referral supposed to placed?

## 2019-05-24 ENCOUNTER — Other Ambulatory Visit: Payer: Self-pay

## 2019-05-24 ENCOUNTER — Encounter: Payer: Self-pay | Admitting: Family Medicine

## 2019-05-24 DIAGNOSIS — U071 COVID-19: Secondary | ICD-10-CM

## 2019-05-27 ENCOUNTER — Other Ambulatory Visit: Payer: Self-pay

## 2019-05-27 ENCOUNTER — Other Ambulatory Visit (INDEPENDENT_AMBULATORY_CARE_PROVIDER_SITE_OTHER): Payer: 59

## 2019-05-27 DIAGNOSIS — U071 COVID-19: Secondary | ICD-10-CM | POA: Diagnosis not present

## 2019-05-28 NOTE — Telephone Encounter (Signed)
I do not believe it is

## 2019-05-29 LAB — SARS-COV-2 ANTIBODY, IGM: SARS-CoV-2 Antibody, IgM: NEGATIVE

## 2019-06-10 ENCOUNTER — Encounter: Payer: Self-pay | Admitting: Family Medicine

## 2019-06-11 ENCOUNTER — Other Ambulatory Visit: Payer: Self-pay | Admitting: Family Medicine

## 2019-06-11 DIAGNOSIS — Z8616 Personal history of COVID-19: Secondary | ICD-10-CM

## 2019-06-11 DIAGNOSIS — Z8619 Personal history of other infectious and parasitic diseases: Secondary | ICD-10-CM

## 2019-06-11 NOTE — Telephone Encounter (Signed)
He needs ov --referral placed

## 2019-06-13 ENCOUNTER — Ambulatory Visit (INDEPENDENT_AMBULATORY_CARE_PROVIDER_SITE_OTHER): Payer: 59 | Admitting: Family Medicine

## 2019-06-13 ENCOUNTER — Ambulatory Visit (HOSPITAL_BASED_OUTPATIENT_CLINIC_OR_DEPARTMENT_OTHER)
Admission: RE | Admit: 2019-06-13 | Discharge: 2019-06-13 | Disposition: A | Discharge status: Home / Self Care | Payer: 59 | Source: Ambulatory Visit | Attending: Family Medicine | Admitting: Family Medicine

## 2019-06-13 ENCOUNTER — Other Ambulatory Visit: Payer: Self-pay

## 2019-06-13 ENCOUNTER — Encounter: Payer: Self-pay | Admitting: Family Medicine

## 2019-06-13 VITALS — BP 128/78 | HR 90 | Temp 97.0°F | Resp 18 | Ht 72.0 in | Wt 235.8 lb

## 2019-06-13 DIAGNOSIS — R0600 Dyspnea, unspecified: Secondary | ICD-10-CM | POA: Insufficient documentation

## 2019-06-13 DIAGNOSIS — F321 Major depressive disorder, single episode, moderate: Secondary | ICD-10-CM

## 2019-06-13 DIAGNOSIS — I10 Essential (primary) hypertension: Secondary | ICD-10-CM

## 2019-06-13 DIAGNOSIS — R739 Hyperglycemia, unspecified: Secondary | ICD-10-CM | POA: Diagnosis not present

## 2019-06-13 DIAGNOSIS — E785 Hyperlipidemia, unspecified: Secondary | ICD-10-CM

## 2019-06-13 DIAGNOSIS — Z8619 Personal history of other infectious and parasitic diseases: Secondary | ICD-10-CM | POA: Insufficient documentation

## 2019-06-13 DIAGNOSIS — Z8616 Personal history of COVID-19: Secondary | ICD-10-CM

## 2019-06-13 DIAGNOSIS — R600 Localized edema: Secondary | ICD-10-CM

## 2019-06-13 LAB — CBC WITH DIFFERENTIAL/PLATELET
Basophils Absolute: 0.1 10*3/uL (ref 0.0–0.1)
Basophils Relative: 1.1 % (ref 0.0–3.0)
Eosinophils Absolute: 0.2 10*3/uL (ref 0.0–0.7)
Eosinophils Relative: 3.7 % (ref 0.0–5.0)
HCT: 38.3 % — ABNORMAL LOW (ref 39.0–52.0)
Hemoglobin: 12.7 g/dL — ABNORMAL LOW (ref 13.0–17.0)
Lymphocytes Relative: 43.7 % (ref 12.0–46.0)
Lymphs Abs: 2.7 10*3/uL (ref 0.7–4.0)
MCHC: 33.1 g/dL (ref 30.0–36.0)
MCV: 91.2 fl (ref 78.0–100.0)
Monocytes Absolute: 0.6 10*3/uL (ref 0.1–1.0)
Monocytes Relative: 9.2 % (ref 3.0–12.0)
Neutro Abs: 2.6 10*3/uL (ref 1.4–7.7)
Neutrophils Relative %: 42.3 % — ABNORMAL LOW (ref 43.0–77.0)
Platelets: 352 10*3/uL (ref 150.0–400.0)
RBC: 4.2 Mil/uL — ABNORMAL LOW (ref 4.22–5.81)
RDW: 17.3 % — ABNORMAL HIGH (ref 11.5–15.5)
WBC: 6.3 10*3/uL (ref 4.0–10.5)

## 2019-06-13 LAB — LIPID PANEL
Cholesterol: 222 mg/dL — ABNORMAL HIGH (ref 0–200)
HDL: 60.4 mg/dL (ref >39.00)
NonHDL: 161.26
Total CHOL/HDL Ratio: 4
Triglycerides: 350 mg/dL — ABNORMAL HIGH (ref 0.0–149.0)
VLDL: 70 mg/dL — ABNORMAL HIGH (ref 0.0–40.0)

## 2019-06-13 LAB — COMPREHENSIVE METABOLIC PANEL
ALT: 49 U/L (ref 0–53)
AST: 29 U/L (ref 0–37)
Albumin: 4.4 g/dL (ref 3.5–5.2)
Alkaline Phosphatase: 61 U/L (ref 39–117)
BUN: 17 mg/dL (ref 6–23)
CO2: 29 mEq/L (ref 19–32)
Calcium: 10.1 mg/dL (ref 8.4–10.5)
Chloride: 102 mEq/L (ref 96–112)
Creatinine, Ser: 0.95 mg/dL (ref 0.40–1.50)
GFR: 96.55 mL/min (ref >60.00)
Glucose, Bld: 89 mg/dL (ref 70–99)
Potassium: 4.3 mEq/L (ref 3.5–5.1)
Sodium: 141 mEq/L (ref 135–145)
Total Bilirubin: 0.6 mg/dL (ref 0.2–1.2)
Total Protein: 7 g/dL (ref 6.0–8.3)

## 2019-06-13 LAB — LDL CHOLESTEROL, DIRECT: Direct LDL: 56 mg/dL

## 2019-06-13 LAB — HEMOGLOBIN A1C: Hgb A1c MFr Bld: 5.9 % (ref 4.6–6.5)

## 2019-06-13 LAB — SARS-COV-2 IGG: SARS-COV-2 IgG: 43.51

## 2019-06-13 MED ORDER — FLUOXETINE HCL 40 MG PO CAPS: 40.0000 mg | ORAL_CAPSULE | Freq: Every day | ORAL | 3 refills | Status: AC

## 2019-06-13 NOTE — Assessment & Plan Note (Signed)
Better today With sob and hx of hosp for covid Check echo Check cxr

## 2019-06-13 NOTE — Assessment & Plan Note (Signed)
Tolerating statin, encouraged heart healthy diet, avoid trans fats, minimize simple carbs and saturated fats. Increase exercise as tolerated 

## 2019-06-13 NOTE — Progress Notes (Signed)
Patient ID: Dennis Moore, male    DOB: [birth year]  Age: 63 y.o. MRN: [id number]    Subjective:  Subjective  HPI Dennis Moore presents for f/u from hosp for covid.  He still c/o sob and has had swelling in his ankles that is better today.  He thinks the sob has gotten a little worse.  He also c/o some worsening depression and thoughts of death but states he is not suicidal   Review of Systems  Constitutional: Negative for appetite change, diaphoresis, fatigue and unexpected weight change.  Eyes: Negative for pain, redness and visual disturbance.  Respiratory: Positive for shortness of breath. Negative for cough, chest tightness and wheezing.   Cardiovascular: Positive for leg swelling. Negative for chest pain and palpitations.  Endocrine: Negative for cold intolerance, heat intolerance, polydipsia, polyphagia and polyuria.  Genitourinary: Negative for difficulty urinating, dysuria and frequency.  Neurological: Negative for dizziness, light-headedness, numbness and headaches.    History Past Medical History:  Diagnosis Date  . Allergic rhinitis   . BPH (benign prostatic hypertrophy)   . Depression   . Erectile dysfunction   . Hyperlipidemia   . Hypertension   . Internal hemorrhoids   . Tubular adenoma of colon 11/2008    He has a past surgical history that includes Appendectomy; Elbow surgery; Carpal tunnel release; and Inguinal hernia repair.   His family history includes Alzheimer's disease in his paternal uncle; Colon cancer in his maternal uncle; Heart disease in his father and mother; Hyperlipidemia in his father; Hypertension in his father; Parkinsonism in his father.He reports that he has never smoked. He has never used smokeless tobacco. He reports current alcohol use. He reports that he does not use drugs.  Current Outpatient Medications on File Prior to Visit  Medication Sig Dispense Refill  . albuterol (PROVENTIL HFA;VENTOLIN HFA) 108 (90 Base) MCG/ACT inhaler  Inhale 2 puffs into the lungs every 6 (six) hours as needed for wheezing. 54 Inhaler 3  . amLODipine (NORVASC) 5 MG tablet Take 5 mg by mouth daily.    Marland Kitchen aspirin EC 81 MG tablet Take 81 mg by mouth daily.    Marland Kitchen atorvastatin (LIPITOR) 80 MG tablet Take 80 mg by mouth daily.    . cholecalciferol (VITAMIN D3) 25 MCG (1000 UT) tablet Take 1,000 Units by mouth daily.    Marland Kitchen lisinopril-hydrochlorothiazide (PRINZIDE,ZESTORETIC) 20-12.5 MG tablet Take 2 tablets by mouth daily. 60 tablet 11  . omeprazole (PRILOSEC) 20 MG capsule Take 20 mg by mouth daily as needed for indigestion.    . sildenafil (VIAGRA) 100 MG tablet Take 0.5 tablets (50 mg total) by mouth daily as needed for erectile dysfunction. 10 tablet 0  . vitamin C (ASCORBIC ACID) 500 MG tablet Take 500 mg by mouth daily.    Marland Kitchen zinc gluconate 50 MG tablet Take 50 mg by mouth daily.    . rosuvastatin (CRESTOR) 20 MG tablet Take 1 tablet (20 mg total) by mouth daily. 30 tablet 5   No current facility-administered medications on file prior to visit.      Objective:  Objective  Physical Exam Vitals signs and nursing note reviewed.  Constitutional:      General: He is sleeping.     Appearance: He is well-developed.  HENT:     Head: Normocephalic and atraumatic.  Eyes:     Pupils: Pupils are equal, round, and reactive to light.  Neck:     Musculoskeletal: Normal range of motion and neck supple.  Thyroid: No thyromegaly.  Cardiovascular:     Rate and Rhythm: Normal rate and regular rhythm.     Heart sounds: No murmur.  Pulmonary:     Effort: Pulmonary effort is normal. No respiratory distress.     Breath sounds: Normal breath sounds. No wheezing or rales.  Chest:     Chest wall: No tenderness.  Musculoskeletal:        General: Swelling present. No tenderness.     Right lower leg: Edema present.     Left lower leg: Edema present.     Comments: Tr pitting edema ---  Low ext   Skin:    General: Skin is warm and dry.  Neurological:      Mental Status: He is oriented to person, place, and time.  Psychiatric:        Behavior: Behavior normal.        Thought Content: Thought content normal.        Judgment: Judgment normal.    BP 128/78 (BP Location: Right Arm, Patient Position: Sitting, Cuff Size: Large)   Pulse 90   Temp (!) 97 F (36.1 C) (Temporal)   Resp 18   Ht 6' (1.829 m)   Wt 235 lb 12.8 oz (107 kg)   SpO2 99%   BMI 31.98 kg/m  Wt Readings from Last 3 Encounters:  06/13/19 235 lb 12.8 oz (107 kg)  05/10/19 208 lb (94.3 kg)  05/05/19 219 lb 2.2 oz (99.4 kg)     Lab Results  Component Value Date   WBC 8.3 05/08/2019   HGB 11.0 (L) 05/08/2019   HCT 33.6 (L) 05/08/2019   PLT 373 05/08/2019   GLUCOSE 84 05/08/2019   CHOL 438 (H) 02/23/2016   TRIG 194 (H) 04/24/2019   HDL 56.70 02/23/2016   LDLDIRECT 80.0 02/23/2016   LDLCALC 67 10/12/2010   ALT 86 (H) 05/08/2019   AST 32 05/08/2019   NA 137 05/08/2019   K 3.6 05/08/2019   CL 106 05/08/2019   CREATININE 0.75 05/08/2019   BUN 10 05/08/2019   CO2 21 (L) 05/08/2019   TSH 2.12 02/23/2016   PSA 0.67 02/23/2016   INR 1.0 05/08/2019   HGBA1C 6.7 (H) 05/02/2019   MICROALBUR <0.7 02/11/2015    Dg Chest Port 1 View  Result Date: 04/25/2019 CLINICAL DATA:  COVID-19 positive patient who is intubated. Repositioning of endotracheal tube. EXAM: PORTABLE CHEST 1 VIEW COMPARISON:  Single-view of the chest 04/23/2014. FINDINGS: Endotracheal tube has been pulled back with the tip now in good position at the level of the clavicular heads. NG tube courses into the stomach and below the inferior margin of the film. Extensive bilateral airspace disease has worsened. Heart size is upper normal. No pneumothorax. IMPRESSION: ETT is in good position. Worsened multifocal pneumonia. Electronically Signed   By: Inge Rise M.D.   On: 04/25/2019 11:29   Dg Chest Port 1 View  Addendum Date: 04/24/2019   ADDENDUM REPORT: 04/24/2019 22:52 ADDENDUM: ETT position  discussed by telephone with Dr. Gerlene Fee on 04/24/2019 at 22:50 hours. Electronically Signed   By: Genevie Ann M.D.   On: 04/24/2019 22:52   Result Date: 04/24/2019 CLINICAL DATA:  63 year old male intubated. Positive for COVID-19. EXAM: PORTABLE CHEST 1 VIEW COMPARISON:  1830 hours today and earlier. FINDINGS: Portable AP supine view at 2236 hours. Endotracheal tube tip in place and just inside the right mainstem bronchus. Retraction of 2-3 centimeters should allow for optimal placement. Increased retrocardiac opacity and  air bronchograms probably in part atelectasis. Patchy and indistinct multifocal bilateral pulmonary opacity elsewhere persist. No pleural effusion or pneumothorax. Stable cardiac size and mediastinal contours. IMPRESSION: 1. Right mainstem bronchus intubation. Retraction of 2-3 cm should allow for optimal ETT placement. 2. Associated left lower lobe atelectasis. Otherwise stable bilateral COVID-19 pneumonia Electronically Signed: By: Genevie Ann M.D. On: 04/24/2019 22:48   Dg Chest Portable 1 View  Result Date: 04/24/2019 CLINICAL DATA:  Shortness of breath, COVID-19 positive EXAM: PORTABLE CHEST 1 VIEW COMPARISON:  02/10/2014 FINDINGS: Multifocal bilateral interstitial and ground-glass opacity. No pleural effusion. Mild cardiomegaly. No pneumothorax. IMPRESSION: Multifocal bilateral interstitial and ground-glass opacity, felt consistent with pneumonia and provided clinical history. Electronically Signed   By: Donavan Foil M.D.   On: 04/24/2019 19:02     Assessment & Plan:  Plan  I have discontinued Gwyndolyn Saxon A. Crevier's FLUoxetine. I am also having him start on FLUoxetine. Additionally, I am having him maintain his sildenafil, rosuvastatin, lisinopril-hydrochlorothiazide, albuterol, amLODipine, atorvastatin, cholecalciferol, vitamin C, zinc gluconate, aspirin EC, and omeprazole.  Meds ordered this encounter  Medications  . FLUoxetine (PROZAC) 40 MG capsule    Sig: Take 1 capsule (40  mg total) by mouth daily.    Dispense:  90 capsule    Refill:  3    Problem List Items Addressed This Visit      Unprioritized   Depression, major, single episode, moderate (HCC)   Relevant Medications   FLUoxetine (PROZAC) 40 MG capsule   Dyspnea - Primary    cxr Refer to pulm        Relevant Orders   ECHOCARDIOGRAM COMPLETE   DG Chest 2 View (HP) (Completed)   Essential hypertension (Chronic)    Well controlled, no changes to meds. Encouraged heart healthy diet such as the DASH diet and exercise as tolerated.       History of 2019 novel coronavirus disease (COVID-19)    No new symptoms       Relevant Orders   Lipid panel   Hemoglobin A1c   Comprehensive metabolic panel   CBC with Differential   SARS-COV-2 IgG   Hyperlipidemia    Tolerating statin, encouraged heart healthy diet, avoid trans fats, minimize simple carbs and saturated fats. Increase exercise as tolerated      Relevant Orders   Lipid panel   Comprehensive metabolic panel   Lower extremity edema    Better today With sob and hx of hosp for covid Check echo Check cxr        Relevant Orders   ECHOCARDIOGRAM COMPLETE    Other Visit Diagnoses    Hyperglycemia       Relevant Orders   Hemoglobin A1c   Comprehensive metabolic panel      Follow-up: Return in about 3 months (around 09/11/2019), or if symptoms worsen or fail to improve.  Ann Held, DO

## 2019-06-13 NOTE — Patient Instructions (Signed)
This information is directly available on the CDC website: https://www.cdc.gov/coronavirus/2019-ncov/if-you-are-sick/steps-when-sick.html    Source:CDC Reference to specific commercial products, manufacturers, companies, or trademarks does not constitute its endorsement or recommendation by the U.S. Government, Department of Health and Human Services, or Centers for Disease Control and Prevention.  

## 2019-06-13 NOTE — Assessment & Plan Note (Signed)
cxr Refer to pulm

## 2019-06-13 NOTE — Assessment & Plan Note (Signed)
No new symptoms.

## 2019-06-13 NOTE — Assessment & Plan Note (Signed)
Well controlled, no changes to meds. Encouraged heart healthy diet such as the DASH diet and exercise as tolerated.  °

## 2019-06-18 ENCOUNTER — Other Ambulatory Visit: Payer: Self-pay

## 2019-06-18 ENCOUNTER — Other Ambulatory Visit: Payer: Self-pay | Admitting: Family Medicine

## 2019-06-18 MED ORDER — TIZANIDINE HCL 4 MG PO TABS: 4.0000 mg | ORAL_TABLET | Freq: Four times a day (QID) | ORAL | 0 refills | Status: DC | PRN

## 2019-06-21 ENCOUNTER — Ambulatory Visit (HOSPITAL_BASED_OUTPATIENT_CLINIC_OR_DEPARTMENT_OTHER): Payer: 59

## 2019-07-01 ENCOUNTER — Ambulatory Visit: Payer: 59 | Attending: Internal Medicine

## 2019-07-01 DIAGNOSIS — U071 COVID-19: Secondary | ICD-10-CM

## 2019-07-01 DIAGNOSIS — R238 Other skin changes: Secondary | ICD-10-CM

## 2019-07-02 LAB — NOVEL CORONAVIRUS, NAA: SARS-CoV-2, NAA: NOT DETECTED

## 2019-08-01 ENCOUNTER — Other Ambulatory Visit: Payer: Self-pay | Admitting: Family Medicine

## 2019-08-15 ENCOUNTER — Telehealth: Payer: Self-pay | Admitting: Family Medicine

## 2019-08-15 NOTE — Telephone Encounter (Signed)
Mariann Laster from [facility] called asking about order number [id number] for speech therapy for pat. Informed her that there was no record scanned for that order. She stated she faxed back over on nov. 5th. I provided Mariann Laster back fax  [phone] so she can refax to [facility]. Nira Conn will be looking out for it.

## 2019-10-11 ENCOUNTER — Other Ambulatory Visit: Payer: Self-pay | Admitting: Family Medicine

## 2019-10-11 ENCOUNTER — Encounter: Payer: Self-pay | Admitting: Family Medicine

## 2019-10-14 NOTE — Telephone Encounter (Signed)
Request: tizanidine 4mg  Last refill: 08/02/2019 #30 x 0 Last OV: 06/13/2019 Next OV: none

## 2020-03-13 ENCOUNTER — Encounter: Payer: Self-pay | Admitting: Family Medicine

## 2020-03-24 ENCOUNTER — Ambulatory Visit (INDEPENDENT_AMBULATORY_CARE_PROVIDER_SITE_OTHER): Payer: 59 | Admitting: Family Medicine

## 2020-03-24 ENCOUNTER — Encounter: Payer: Self-pay | Admitting: Family Medicine

## 2020-03-24 ENCOUNTER — Other Ambulatory Visit: Payer: Self-pay

## 2020-03-24 VITALS — BP 116/68 | HR 74 | Temp 98.7°F | Resp 18 | Ht 72.0 in | Wt 239.4 lb

## 2020-03-24 DIAGNOSIS — R0600 Dyspnea, unspecified: Secondary | ICD-10-CM

## 2020-03-24 DIAGNOSIS — E785 Hyperlipidemia, unspecified: Secondary | ICD-10-CM | POA: Diagnosis not present

## 2020-03-24 DIAGNOSIS — F321 Major depressive disorder, single episode, moderate: Secondary | ICD-10-CM | POA: Diagnosis not present

## 2020-03-24 DIAGNOSIS — R413 Other amnesia: Secondary | ICD-10-CM | POA: Diagnosis not present

## 2020-03-24 DIAGNOSIS — I10 Essential (primary) hypertension: Secondary | ICD-10-CM

## 2020-03-24 DIAGNOSIS — Z8616 Personal history of COVID-19: Secondary | ICD-10-CM

## 2020-03-24 NOTE — Progress Notes (Signed)
Patient ID: Dennis Moore, male    DOB: 04-13-1956  Age: 64 y.o. MRN: [id number]    Subjective:  Subjective  HPI Dennis Moore presents for f/u bp and cholesterol and needs paperwork re done for his disability---  Pt had not realized that it had expired.  Pt has long covid symptoms with sob and memory problems  He also has several year history of depression / anxiety  No other compliants  Review of Systems  Constitutional: Negative for appetite change, diaphoresis, fatigue and unexpected weight change.  Eyes: Negative for pain, redness and visual disturbance.  Respiratory: Negative for cough, chest tightness, shortness of breath and wheezing.   Cardiovascular: Negative for chest pain, palpitations and leg swelling.  Endocrine: Negative for cold intolerance, heat intolerance, polydipsia, polyphagia and polyuria.  Genitourinary: Negative for difficulty urinating, dysuria and frequency.  Neurological: Negative for dizziness, light-headedness, numbness and headaches.  Psychiatric/Behavioral: Positive for confusion, decreased concentration and dysphoric mood. Negative for self-injury, sleep disturbance and suicidal ideas. The patient is nervous/anxious.     History Past Medical History:  Diagnosis Date  . Allergic rhinitis   . BPH (benign prostatic hypertrophy)   . Depression   . Erectile dysfunction   . Hyperlipidemia   . Hypertension   . Internal hemorrhoids   . Tubular adenoma of colon 11/2008    He has a past surgical history that includes Appendectomy; Elbow surgery; Carpal tunnel release; and Inguinal hernia repair.   His family history includes Alzheimer's disease in his paternal uncle; Colon cancer in his maternal uncle; Heart disease in his father and mother; Hyperlipidemia in his father; Hypertension in his father; Parkinsonism in his father.He reports that he has never smoked. He has never used smokeless tobacco. He reports current alcohol use. He reports that he does  not use drugs.  Current Outpatient Medications on File Prior to Visit  Medication Sig Dispense Refill  . albuterol (PROVENTIL HFA;VENTOLIN HFA) 108 (90 Base) MCG/ACT inhaler Inhale 2 puffs into the lungs every 6 (six) hours as needed for wheezing. 54 Inhaler 3  . amLODipine (NORVASC) 5 MG tablet Take 5 mg by mouth daily.    Marland Kitchen aspirin EC 81 MG tablet Take 81 mg by mouth daily.    Marland Kitchen atorvastatin (LIPITOR) 80 MG tablet Take 80 mg by mouth daily.    . cholecalciferol (VITAMIN D3) 25 MCG (1000 UT) tablet Take 1,000 Units by mouth daily.    Marland Kitchen FLUoxetine (PROZAC) 40 MG capsule Take 1 capsule (40 mg total) by mouth daily. 90 capsule 3  . lisinopril-hydrochlorothiazide (PRINZIDE,ZESTORETIC) 20-12.5 MG tablet Take 2 tablets by mouth daily. 60 tablet 11  . omeprazole (PRILOSEC) 20 MG capsule Take 20 mg by mouth daily as needed for indigestion.    . sildenafil (VIAGRA) 100 MG tablet Take 0.5 tablets (50 mg total) by mouth daily as needed for erectile dysfunction. 10 tablet 0  . tiZANidine (ZANAFLEX) 4 MG tablet TAKE 1 TABLET BY MOUTH EVERY 6 HOURS AS NEEDED FOR MUSCLE SPASM 30 tablet 0  . vitamin C (ASCORBIC ACID) 500 MG tablet Take 500 mg by mouth daily.    Marland Kitchen zinc gluconate 50 MG tablet Take 50 mg by mouth daily.     No current facility-administered medications on file prior to visit.     Objective:  Objective  Physical Exam Vitals and nursing note reviewed.  Constitutional:      General: He is sleeping.     Appearance: He is well-developed.  HENT:  Head: Normocephalic and atraumatic.  Eyes:     Pupils: Pupils are equal, round, and reactive to light.  Neck:     Thyroid: No thyromegaly.  Cardiovascular:     Rate and Rhythm: Normal rate and regular rhythm.     Heart sounds: No murmur heard.   Pulmonary:     Effort: Pulmonary effort is normal. No respiratory distress.     Breath sounds: Normal breath sounds. No wheezing or rales.  Chest:     Chest wall: No tenderness.  Musculoskeletal:         General: No tenderness.     Cervical back: Normal range of motion and neck supple.  Skin:    General: Skin is warm and dry.  Neurological:     Mental Status: He is oriented to person, place, and time.  Psychiatric:        Behavior: Behavior normal.        Thought Content: Thought content normal.        Judgment: Judgment normal.    BP 116/68 (BP Location: Right Arm, Patient Position: Sitting, Cuff Size: Large)   Pulse 74   Temp 98.7 F (37.1 C) (Oral)   Resp 18   Ht 6' (1.829 m)   Wt 239 lb 6.4 oz (108.6 kg)   SpO2 96%   BMI 32.47 kg/m  Wt Readings from Last 3 Encounters:  03/24/20 239 lb 6.4 oz (108.6 kg)  06/13/19 235 lb 12.8 oz (107 kg)  05/10/19 208 lb (94.3 kg)     Lab Results  Component Value Date   WBC 6.3 06/13/2019   HGB 12.7 (L) 06/13/2019   HCT 38.3 (L) 06/13/2019   PLT 352.0 06/13/2019   GLUCOSE 89 06/13/2019   CHOL 222 (H) 06/13/2019   TRIG 350.0 (H) 06/13/2019   HDL 60.40 06/13/2019   LDLDIRECT 56.0 06/13/2019   LDLCALC 67 10/12/2010   ALT 49 06/13/2019   AST 29 06/13/2019   NA 141 06/13/2019   K 4.3 06/13/2019   CL 102 06/13/2019   CREATININE 0.95 06/13/2019   BUN 17 06/13/2019   CO2 29 06/13/2019   TSH 2.12 02/23/2016   PSA 0.67 02/23/2016   INR 1.0 05/08/2019   HGBA1C 5.9 06/13/2019   MICROALBUR <0.7 02/11/2015    DG Chest 2 View (HP)  Result Date: 06/13/2019 CLINICAL DATA:  Shortness of breath for 2 months since being diagnosed with COVID-19. EXAM: CHEST - 2 VIEW COMPARISON:  Single-view of the chest 04/26/2018. PA and lateral chest 02/10/2014. FINDINGS: The lungs are clear. Heart size is normal. No pneumothorax or pleural fluid. No acute or focal bony abnormality. IMPRESSION: Negative chest. Electronically Signed   By: Inge Rise M.D.   On: 06/13/2019 11:09     Assessment & Plan:  Plan  I am having Dennis Moore maintain his sildenafil, lisinopril-hydrochlorothiazide, albuterol, amLODipine, atorvastatin,  cholecalciferol, vitamin C, zinc gluconate, aspirin EC, omeprazole, FLUoxetine, and tiZANidine.  No orders of the defined types were placed in this encounter.   Problem List Items Addressed This Visit      Unprioritized   Depression, major, single episode, moderate ([place])    phq9--23 con't meds       Dyslipidemia    .Encouraged heart healthy diet, increase exercise, avoid trans fats, consider a krill oil cap daily      Dyspnea    Pt awaiting long hauler clinic for covid in [place] -----he said he was able to get in there quicker  Essential hypertension (Chronic)    Well controlled, no changes to meds. Encouraged heart healthy diet such as the DASH diet and exercise as tolerated.        History of 2019 novel coronavirus disease (COVID-19)    Still struggling sob and memory issues due to covid  mmse 16/ 30 phq9 23 Gad 7 --19      Memory loss - Primary    mmse 16/30 Consider refer to neuro/ neuropsych ---- pt has app with covid clnic in [place] as well          Follow-up: Return if symptoms worsen or fail to improve.  Ann Held, DO

## 2020-03-24 NOTE — Patient Instructions (Signed)

## 2020-03-29 DIAGNOSIS — R413 Other amnesia: Secondary | ICD-10-CM | POA: Insufficient documentation

## 2020-03-29 NOTE — Assessment & Plan Note (Signed)
mmse 16/30 Consider refer to neuro/ neuropsych ---- pt has app with covid clnic in [place] as well

## 2020-03-29 NOTE — Assessment & Plan Note (Signed)
Well controlled, no changes to meds. Encouraged heart healthy diet such as the DASH diet and exercise as tolerated.  °

## 2020-03-29 NOTE — Assessment & Plan Note (Addendum)
phq9--23 con't meds

## 2020-03-29 NOTE — Assessment & Plan Note (Signed)
Still struggling sob and memory issues due to covid  mmse 16/ 30 phq9 23 Gad 7 --19

## 2020-03-29 NOTE — Assessment & Plan Note (Signed)
Pt awaiting long hauler clinic for covid in [place] -----he said he was able to get in there quicker

## 2020-03-29 NOTE — Assessment & Plan Note (Signed)
Encouraged heart healthy diet, increase exercise, avoid trans fats, consider a krill oil cap daily 

## 2020-04-09 ENCOUNTER — Other Ambulatory Visit: Payer: Self-pay | Admitting: Family Medicine

## 2020-05-18 DIAGNOSIS — U099 Post covid-19 condition, unspecified: Secondary | ICD-10-CM | POA: Diagnosis not present

## 2020-05-18 DIAGNOSIS — F32A Depression, unspecified: Secondary | ICD-10-CM | POA: Diagnosis not present

## 2020-05-18 DIAGNOSIS — B948 Sequelae of other specified infectious and parasitic diseases: Secondary | ICD-10-CM | POA: Diagnosis not present

## 2020-05-27 DIAGNOSIS — R4189 Other symptoms and signs involving cognitive functions and awareness: Secondary | ICD-10-CM | POA: Diagnosis not present

## 2020-05-27 DIAGNOSIS — M545 Low back pain, unspecified: Secondary | ICD-10-CM | POA: Diagnosis not present

## 2020-05-27 DIAGNOSIS — R2681 Unsteadiness on feet: Secondary | ICD-10-CM | POA: Diagnosis not present

## 2020-05-27 DIAGNOSIS — Z7409 Other reduced mobility: Secondary | ICD-10-CM | POA: Diagnosis not present

## 2020-05-27 DIAGNOSIS — G8929 Other chronic pain: Secondary | ICD-10-CM | POA: Diagnosis not present

## 2020-05-27 DIAGNOSIS — U099 Post covid-19 condition, unspecified: Secondary | ICD-10-CM | POA: Diagnosis not present

## 2020-05-28 DIAGNOSIS — N529 Male erectile dysfunction, unspecified: Secondary | ICD-10-CM | POA: Diagnosis not present

## 2020-05-28 DIAGNOSIS — Z791 Long term (current) use of non-steroidal anti-inflammatories (NSAID): Secondary | ICD-10-CM | POA: Diagnosis not present

## 2020-05-28 DIAGNOSIS — Z7982 Long term (current) use of aspirin: Secondary | ICD-10-CM | POA: Diagnosis not present

## 2020-05-28 DIAGNOSIS — E785 Hyperlipidemia, unspecified: Secondary | ICD-10-CM | POA: Diagnosis not present

## 2020-05-28 DIAGNOSIS — I10 Essential (primary) hypertension: Secondary | ICD-10-CM | POA: Diagnosis not present

## 2020-05-28 DIAGNOSIS — R3915 Urgency of urination: Secondary | ICD-10-CM | POA: Diagnosis not present

## 2020-05-28 DIAGNOSIS — M1991 Primary osteoarthritis, unspecified site: Secondary | ICD-10-CM | POA: Diagnosis not present

## 2020-05-28 DIAGNOSIS — N3941 Urge incontinence: Secondary | ICD-10-CM | POA: Diagnosis not present

## 2020-05-28 DIAGNOSIS — R350 Frequency of micturition: Secondary | ICD-10-CM | POA: Diagnosis not present

## 2020-05-28 DIAGNOSIS — J4530 Mild persistent asthma, uncomplicated: Secondary | ICD-10-CM | POA: Diagnosis not present

## 2020-06-23 DIAGNOSIS — G8929 Other chronic pain: Secondary | ICD-10-CM | POA: Diagnosis not present

## 2020-06-23 DIAGNOSIS — R42 Dizziness and giddiness: Secondary | ICD-10-CM | POA: Diagnosis not present

## 2020-06-23 DIAGNOSIS — R2681 Unsteadiness on feet: Secondary | ICD-10-CM | POA: Diagnosis not present

## 2020-06-23 DIAGNOSIS — R0600 Dyspnea, unspecified: Secondary | ICD-10-CM | POA: Diagnosis not present

## 2020-06-23 DIAGNOSIS — U099 Post covid-19 condition, unspecified: Secondary | ICD-10-CM | POA: Diagnosis not present

## 2020-06-23 DIAGNOSIS — Z7409 Other reduced mobility: Secondary | ICD-10-CM | POA: Diagnosis not present

## 2020-06-23 DIAGNOSIS — R5383 Other fatigue: Secondary | ICD-10-CM | POA: Diagnosis not present

## 2020-06-23 DIAGNOSIS — M545 Low back pain, unspecified: Secondary | ICD-10-CM | POA: Diagnosis not present

## 2020-06-23 DIAGNOSIS — F331 Major depressive disorder, recurrent, moderate: Secondary | ICD-10-CM | POA: Diagnosis not present

## 2020-06-23 DIAGNOSIS — R0602 Shortness of breath: Secondary | ICD-10-CM | POA: Diagnosis not present

## 2020-06-23 DIAGNOSIS — R413 Other amnesia: Secondary | ICD-10-CM | POA: Diagnosis not present

## 2020-06-23 DIAGNOSIS — H538 Other visual disturbances: Secondary | ICD-10-CM | POA: Diagnosis not present

## 2020-06-23 DIAGNOSIS — F32A Depression, unspecified: Secondary | ICD-10-CM | POA: Diagnosis not present

## 2020-06-23 DIAGNOSIS — B948 Sequelae of other specified infectious and parasitic diseases: Secondary | ICD-10-CM | POA: Diagnosis not present

## 2020-06-23 DIAGNOSIS — R4189 Other symptoms and signs involving cognitive functions and awareness: Secondary | ICD-10-CM | POA: Diagnosis not present

## 2020-06-26 DIAGNOSIS — Z0000 Encounter for general adult medical examination without abnormal findings: Secondary | ICD-10-CM | POA: Diagnosis not present

## 2020-06-26 DIAGNOSIS — F419 Anxiety disorder, unspecified: Secondary | ICD-10-CM | POA: Diagnosis not present

## 2020-06-29 DIAGNOSIS — H903 Sensorineural hearing loss, bilateral: Secondary | ICD-10-CM | POA: Diagnosis not present

## 2020-07-17 DIAGNOSIS — H903 Sensorineural hearing loss, bilateral: Secondary | ICD-10-CM | POA: Diagnosis not present

## 2020-07-19 DIAGNOSIS — R4781 Slurred speech: Secondary | ICD-10-CM | POA: Diagnosis not present

## 2020-07-19 DIAGNOSIS — R29818 Other symptoms and signs involving the nervous system: Secondary | ICD-10-CM | POA: Diagnosis not present

## 2020-08-03 ENCOUNTER — Other Ambulatory Visit: Payer: Self-pay | Admitting: Family Medicine

## 2020-08-03 ENCOUNTER — Encounter: Payer: Self-pay | Admitting: Family Medicine

## 2020-08-05 ENCOUNTER — Encounter: Payer: Self-pay | Admitting: Family Medicine

## 2020-08-06 ENCOUNTER — Other Ambulatory Visit: Payer: Self-pay | Admitting: Family Medicine

## 2020-08-06 NOTE — Telephone Encounter (Signed)
Please advise 

## 2020-08-06 NOTE — Telephone Encounter (Signed)
He really should not have 2 primary care offices--- he needs to pick one --- it is not good for continuity of care

## 2020-08-07 DIAGNOSIS — H524 Presbyopia: Secondary | ICD-10-CM | POA: Diagnosis not present

## 2020-08-07 DIAGNOSIS — Z0100 Encounter for examination of eyes and vision without abnormal findings: Secondary | ICD-10-CM | POA: Diagnosis not present

## 2020-08-10 DIAGNOSIS — D171 Benign lipomatous neoplasm of skin and subcutaneous tissue of trunk: Secondary | ICD-10-CM | POA: Diagnosis not present

## 2020-08-10 DIAGNOSIS — M545 Low back pain, unspecified: Secondary | ICD-10-CM | POA: Diagnosis not present

## 2020-08-10 DIAGNOSIS — R5383 Other fatigue: Secondary | ICD-10-CM | POA: Diagnosis not present

## 2020-08-10 DIAGNOSIS — M8949 Other hypertrophic osteoarthropathy, multiple sites: Secondary | ICD-10-CM | POA: Diagnosis not present

## 2020-08-10 DIAGNOSIS — G8929 Other chronic pain: Secondary | ICD-10-CM | POA: Diagnosis not present

## 2020-08-10 DIAGNOSIS — Z0000 Encounter for general adult medical examination without abnormal findings: Secondary | ICD-10-CM | POA: Diagnosis not present

## 2020-08-10 DIAGNOSIS — I10 Essential (primary) hypertension: Secondary | ICD-10-CM | POA: Diagnosis not present

## 2020-08-10 DIAGNOSIS — Z6831 Body mass index (BMI) 31.0-31.9, adult: Secondary | ICD-10-CM | POA: Diagnosis not present

## 2020-08-10 DIAGNOSIS — F32A Depression, unspecified: Secondary | ICD-10-CM | POA: Diagnosis not present

## 2020-08-20 DIAGNOSIS — M8949 Other hypertrophic osteoarthropathy, multiple sites: Secondary | ICD-10-CM | POA: Diagnosis not present

## 2020-08-20 DIAGNOSIS — Z8616 Personal history of COVID-19: Secondary | ICD-10-CM | POA: Diagnosis not present

## 2020-08-20 DIAGNOSIS — I10 Essential (primary) hypertension: Secondary | ICD-10-CM | POA: Diagnosis not present

## 2020-08-20 DIAGNOSIS — Z6831 Body mass index (BMI) 31.0-31.9, adult: Secondary | ICD-10-CM | POA: Diagnosis not present

## 2020-08-24 DIAGNOSIS — R5383 Other fatigue: Secondary | ICD-10-CM | POA: Diagnosis not present

## 2020-08-24 DIAGNOSIS — R4189 Other symptoms and signs involving cognitive functions and awareness: Secondary | ICD-10-CM | POA: Diagnosis not present

## 2020-08-24 DIAGNOSIS — R42 Dizziness and giddiness: Secondary | ICD-10-CM | POA: Diagnosis not present

## 2020-08-24 DIAGNOSIS — F32A Depression, unspecified: Secondary | ICD-10-CM | POA: Diagnosis not present

## 2020-08-24 DIAGNOSIS — F419 Anxiety disorder, unspecified: Secondary | ICD-10-CM | POA: Diagnosis not present

## 2020-08-24 DIAGNOSIS — R0602 Shortness of breath: Secondary | ICD-10-CM | POA: Diagnosis not present

## 2020-08-24 DIAGNOSIS — Z8616 Personal history of COVID-19: Secondary | ICD-10-CM | POA: Diagnosis not present

## 2020-08-24 DIAGNOSIS — M545 Low back pain, unspecified: Secondary | ICD-10-CM | POA: Diagnosis not present

## 2020-08-24 DIAGNOSIS — R600 Localized edema: Secondary | ICD-10-CM | POA: Diagnosis not present

## 2020-08-27 DIAGNOSIS — Z880 Allergy status to penicillin: Secondary | ICD-10-CM | POA: Diagnosis not present

## 2020-08-27 DIAGNOSIS — I10 Essential (primary) hypertension: Secondary | ICD-10-CM | POA: Diagnosis not present

## 2020-08-27 DIAGNOSIS — E785 Hyperlipidemia, unspecified: Secondary | ICD-10-CM | POA: Diagnosis not present

## 2020-08-27 DIAGNOSIS — M545 Low back pain, unspecified: Secondary | ICD-10-CM | POA: Diagnosis not present

## 2020-08-27 DIAGNOSIS — M439 Deforming dorsopathy, unspecified: Secondary | ICD-10-CM | POA: Diagnosis not present

## 2020-08-27 DIAGNOSIS — M47816 Spondylosis without myelopathy or radiculopathy, lumbar region: Secondary | ICD-10-CM | POA: Diagnosis not present

## 2020-08-27 DIAGNOSIS — G8929 Other chronic pain: Secondary | ICD-10-CM | POA: Diagnosis not present

## 2020-08-27 DIAGNOSIS — M5136 Other intervertebral disc degeneration, lumbar region: Secondary | ICD-10-CM | POA: Diagnosis not present

## 2020-08-27 DIAGNOSIS — B948 Sequelae of other specified infectious and parasitic diseases: Secondary | ICD-10-CM | POA: Diagnosis not present

## 2020-08-27 DIAGNOSIS — U099 Post covid-19 condition, unspecified: Secondary | ICD-10-CM | POA: Diagnosis not present

## 2020-09-04 DIAGNOSIS — U099 Post covid-19 condition, unspecified: Secondary | ICD-10-CM | POA: Diagnosis not present

## 2020-09-04 DIAGNOSIS — R0600 Dyspnea, unspecified: Secondary | ICD-10-CM | POA: Diagnosis not present

## 2020-09-04 DIAGNOSIS — B948 Sequelae of other specified infectious and parasitic diseases: Secondary | ICD-10-CM | POA: Diagnosis not present

## 2020-11-06 DIAGNOSIS — D171 Benign lipomatous neoplasm of skin and subcutaneous tissue of trunk: Secondary | ICD-10-CM | POA: Diagnosis not present

## 2020-11-06 DIAGNOSIS — I10 Essential (primary) hypertension: Secondary | ICD-10-CM | POA: Diagnosis not present

## 2020-11-06 DIAGNOSIS — M79674 Pain in right toe(s): Secondary | ICD-10-CM | POA: Diagnosis not present

## 2020-12-28 DIAGNOSIS — J329 Chronic sinusitis, unspecified: Secondary | ICD-10-CM | POA: Diagnosis not present

## 2021-03-02 ENCOUNTER — Ambulatory Visit: Payer: 59

## 2021-03-02 NOTE — Progress Notes (Unsigned)
Subjective:   Dennis Moore is a 65 y.o. male who presents for an Initial Medicare Annual Wellness Visit.  I connected with  *** today by a video enabled telemedicine application and verified that I am speaking with the correct person using two identifiers.  Location of patient:*** Location of provider:Work  Persons participating in the virtual visit: patient, nurse.   I discussed the limitations, risk, security and privacy concerns of evaluation and management by telemedicine. The patient expressed understanding and agreed to proceed.  Some vital signs may be absent or patient reported.   Review of Systems    ***       Objective:    There were no vitals filed for this visit. There is no height or weight on file to calculate BMI.  Advanced Directives 04/27/2019 04/24/2019 08/17/2015  Does Patient Have a Medical Advance Directive? No No No  Would patient like information on creating a medical advance directive? - No - Patient declined No - patient declined information    Current Medications (verified) Outpatient Encounter Medications as of 03/02/2021  Medication Sig   albuterol (PROVENTIL HFA;VENTOLIN HFA) 108 (90 Base) MCG/ACT inhaler Inhale 2 puffs into the lungs every 6 (six) hours as needed for wheezing.   amLODipine (NORVASC) 5 MG tablet Take 5 mg by mouth daily.   aspirin EC 81 MG tablet Take 81 mg by mouth daily.   atorvastatin (LIPITOR) 80 MG tablet Take 80 mg by mouth daily.   cholecalciferol (VITAMIN D3) 25 MCG (1000 UT) tablet Take 1,000 Units by mouth daily.   FLUoxetine (PROZAC) 40 MG capsule Take 1 capsule (40 mg total) by mouth daily.   lisinopril-hydrochlorothiazide (PRINZIDE,ZESTORETIC) 20-12.5 MG tablet Take 2 tablets by mouth daily.   omeprazole (PRILOSEC) 20 MG capsule Take 20 mg by mouth daily as needed for indigestion.   sildenafil (VIAGRA) 100 MG tablet Take 0.5 tablets (50 mg total) by mouth daily as needed for erectile dysfunction.   tiZANidine  (ZANAFLEX) 4 MG tablet Take 1 tablet (4 mg total) by mouth every 6 (six) hours as needed for muscle spasms.   vitamin C (ASCORBIC ACID) 500 MG tablet Take 500 mg by mouth daily.   zinc gluconate 50 MG tablet Take 50 mg by mouth daily.   No facility-administered encounter medications on file as of 03/02/2021.    Allergies (verified) Penicillins   History: Past Medical History:  Diagnosis Date   Allergic rhinitis    BPH (benign prostatic hypertrophy)    Depression    Erectile dysfunction    Hyperlipidemia    Hypertension    Internal hemorrhoids    Tubular adenoma of colon 11/2008   Past Surgical History:  Procedure Laterality Date   APPENDECTOMY     CARPAL TUNNEL RELEASE     right   ELBOW SURGERY     right   INGUINAL HERNIA REPAIR     age 63   Family History  Problem Relation Age of Onset   Alzheimer's disease Paternal Uncle    Parkinsonism Father    Hyperlipidemia Father    Hypertension Father    Heart disease Father    Colon cancer Maternal Uncle    Heart disease Mother    Social History   Socioeconomic History   Marital status: Married    Spouse name: Not on file   Number of children: 6   Years of education: Not on file   Highest education level: Not on file  Occupational History   Occupation:  disabled    Employer: DISABLED  Tobacco Use   Smoking status: Never   Smokeless tobacco: Never  Vaping Use   Vaping Use: Unknown  Substance and Sexual Activity   Alcohol use: Yes    Comment: occ   Drug use: No   Sexual activity: Not on file  Other Topics Concern   Not on file  Social History Narrative   Not on file   Social Determinants of Health   Financial Resource Strain: Not on file  Food Insecurity: Not on file  Transportation Needs: Not on file  Physical Activity: Not on file  Stress: Not on file  Social Connections: Not on file    Tobacco Counseling Counseling given: Not Answered   Clinical Intake:                 Diabetic?No          Activities of Daily Living No flowsheet data found.  No care team member to display  Indicate any recent Medical Services you may have received from other than [facility] providers in the past year (date may be approximate).     Assessment:   This is a routine wellness examination for Dennis Moore.  Hearing/Vision screen No results found.  Dietary issues and exercise activities discussed:     Goals Addressed   None    Depression Screen PHQ 2/9 Scores 03/24/2020 03/24/2020 02/23/2016 08/17/2015 02/12/2015  PHQ - 2 Score 6 6 0 0 0  PHQ- 9 Score 27 23 - - -    Fall Risk Fall Risk  08/17/2015 02/12/2015  Falls in the past year? No No    FALL RISK PREVENTION PERTAINING TO THE HOME:  Any stairs in or around the home? {YES/NO:21197} If so, are there any without handrails? {YES/NO:21197} Home free of loose throw rugs in walkways, pet beds, electrical cords, etc? {YES/NO:21197} Adequate lighting in your home to reduce risk of falls? {YES/NO:21197}  ASSISTIVE DEVICES UTILIZED TO PREVENT FALLS:  Life alert? {YES/NO:21197} Use of a cane, walker or w/c? {YES/NO:21197} Grab bars in the bathroom? {YES/NO:21197} Shower chair or bench in shower? {YES/NO:21197} Elevated toilet seat or a handicapped toilet? {YES/NO:21197}  TIMED UP AND GO:  Was the test performed? {YES/NO:21197}.  Length of time to ambulate 10 feet: *** sec.   {Appearance of Gait:2101803}  Cognitive Function: MMSE - Mini Mental State Exam 03/24/2020  Orientation to time 3  Orientation to Place 0  Registration 2  Attention/ Calculation 3  Recall 1  Language- name 2 objects 2  Language- repeat 0  Language- follow 3 step command 3  Language- read & follow direction 1  Write a sentence 1  Copy design 0  Total score 16        Immunizations Immunization History  Administered Date(s) Administered   Influenza Split 04/14/2011   Influenza,inj,Quad PF,6+ Mos 08/14/2014, 05/19/2016, 04/25/2017, 04/25/2018,  03/12/2019, 03/24/2020   Influenza-Unspecified 04/10/2015   PFIZER(Purple Top)SARS-COV-2 Vaccination 08/30/2019, 09/23/2019   Pneumococcal Polysaccharide-23 08/05/2015   Tdap 07/15/2011   Zoster, Live 08/25/2015    TDAP status: Up to date  Flu Vaccine status: Up to date  Pneumococcal vaccine status: Due, Education has been provided regarding the importance of this vaccine. Advised may receive this vaccine at local pharmacy or Health Dept. Aware to provide a copy of the vaccination record if obtained from local pharmacy or Health Dept. Verbalized acceptance and understanding.  {Covid-19 vaccine status:2101808}  Qualifies for Shingles Vaccine? No   Zostavax completed Yes   Shingrix  Completed?: Yes  Screening Tests Health Maintenance  Topic Date Due   COLONOSCOPY (Pts 45-29yr Insurance coverage will need to be confirmed)  12/15/2016   COVID-19 Vaccine (3 - Booster for Pfizer series) 02/23/2020   PNA vac Low Risk Adult (1 of 2 - PCV13) 05/13/2020   Zoster Vaccines- Shingrix (2 of 2) 08/21/2020   INFLUENZA VACCINE  02/08/2021   TETANUS/TDAP  07/14/2021   Hepatitis C Screening  Completed   HIV Screening  Completed   HPV VACCINES  Aged Out    Health Maintenance  Health Maintenance Due  Topic Date Due   COLONOSCOPY (Pts 45-480yrInsurance coverage will need to be confirmed)  12/15/2016   COVID-19 Vaccine (3 - Booster for Pfizer series) 02/23/2020   PNA vac Low Risk Adult (1 of 2 - PCV13) 05/13/2020   Zoster Vaccines- Shingrix (2 of 2) 08/21/2020   INFLUENZA VACCINE  02/08/2021    {Colorectal cancer screening:2101809}  Lung Cancer Screening: (Low Dose CT Chest recommended if Age 679-80ears, 30 pack-year currently smoking OR have quit w/in 15years.) does not qualify.     Additional Screening:  Hepatitis C Screening: Completed 04/17/2015  Vision Screening: Recommended annual ophthalmology exams for early detection of glaucoma and other disorders of the eye. Is the  patient up to date with their annual eye exam?  {YES/NO:21197} Who is the provider or what is the name of the office in which the patient attends annual eye exams? *** If pt is not established with a provider, would they like to be referred to a provider to establish care? {YES/NO:21197}.   Dental Screening: Recommended annual dental exams for proper oral hygiene  Community Resource Referral / Chronic Care Management: CRR required this visit?  {YES/NO:21197}  CCM required this visit?  {YES/NO:21197}     Plan:     I have personally reviewed and noted the following in the patient's chart:   Medical and social history Use of alcohol, tobacco or illicit drugs  Current medications and supplements including opioid prescriptions. {Opioid [phone]} Functional ability and status Nutritional status Physical activity Advanced directives List of other physicians Hospitalizations, surgeries, and ER visits in previous 12 months Vitals Screenings to include cognitive, depression, and falls Referrals and appointments  In addition, I have reviewed and discussed with patient certain preventive protocols, quality metrics, and best practice recommendations. A written personalized care plan for preventive services as well as general preventive health recommendations were provided to patient.   Due to this being a *** visit, the after visit summary with patients personalized plan was offered to patient via mail or my-chart. ***Patient declined at this time./ Patient would like to access on my-chart/ per request, patient was mailed a copy of AVS./ Patient preferred to pick up at office at next visit.   MaMarta AntuLPN   [id number] Health Advisor  Nurse Notes: ***

## 2021-03-13 DIAGNOSIS — S61412A Laceration without foreign body of left hand, initial encounter: Secondary | ICD-10-CM | POA: Diagnosis not present

## 2023-01-24 ENCOUNTER — Encounter (HOSPITAL_COMMUNITY): Payer: Self-pay

## 2023-01-24 ENCOUNTER — Ambulatory Visit (HOSPITAL_COMMUNITY)
Admission: EM | Admit: 2023-01-24 | Discharge: 2023-01-24 | Disposition: A | Discharge status: Home / Self Care | Payer: 59 | Attending: Internal Medicine | Admitting: Internal Medicine

## 2023-01-24 DIAGNOSIS — N399 Disorder of urinary system, unspecified: Secondary | ICD-10-CM | POA: Diagnosis not present

## 2023-01-24 DIAGNOSIS — Z9189 Other specified personal risk factors, not elsewhere classified: Secondary | ICD-10-CM | POA: Diagnosis not present

## 2023-01-24 LAB — POCT URINALYSIS DIP (MANUAL ENTRY)
Bilirubin, UA: NEGATIVE
Blood, UA: NEGATIVE
Glucose, UA: NEGATIVE mg/dL
Ketones, POC UA: NEGATIVE mg/dL
Leukocytes, UA: NEGATIVE
Nitrite, UA: NEGATIVE
Protein Ur, POC: NEGATIVE mg/dL
Spec Grav, UA: 1.025 (ref 1.010–1.025)
Urobilinogen, UA: 0.2 E.U./dL
pH, UA: 6.5 (ref 5.0–8.0)

## 2023-01-24 NOTE — ED Triage Notes (Signed)
Patient here today to have STD testing. Patient states that he could have some discomfort after he urinates but it could be mental. He is not sure.

## 2023-01-24 NOTE — ED Provider Notes (Signed)
[facility]    CSN: [id number] Arrival date & time: 01/24/23  8295      History   Chief Complaint Chief Complaint  Patient presents with   STD testing    HPI Dennis Moore is a 67 y.o. male.   Patient presents to urgent care for evaluation of urinary discomfort that started a few days ago. Had a recent new male unprotected sexual partner and would like to get checked for STDs. No penile discharge or rash. States he has a "weird feeling to the penis" after he urinates sometimes but not every time. Wonders if this could be "mental". No dysuria, urinary frequency, hesitancy, or urgency. No N/V/D, abdominal pain, or dizziness. No recent fever/chills. No known exposure to STD.      Past Medical History:  Diagnosis Date   Allergic rhinitis    BPH (benign prostatic hypertrophy)    Depression    Erectile dysfunction    Hyperlipidemia    Hypertension    Internal hemorrhoids    Tubular adenoma of colon 11/2008    Patient Active Problem List   Diagnosis Date Noted   Memory loss 03/29/2020   Dyspnea 06/13/2019   Lower extremity edema 06/13/2019   Depression, major, single episode, moderate (HCC) 06/13/2019   History of 2019 novel coronavirus disease (COVID-19) 06/13/2019   COVID-19 virus infection    VAP (ventilator-associated pneumonia) (HCC) 04/28/2019   Depression 04/25/2019   Acute on chronic respiratory failure with hypoxia (HCC) 04/25/2019   Acute respiratory distress syndrome (ARDS) due to COVID-19 virus (HCC) 04/24/2019   Acute respiratory failure with hypoxia (HCC) 04/24/2019   Eunuchoidism 08/03/2015   Generalized OA 07/01/2015   Asthma, mild persistent 07/01/2015   Dyslipidemia 07/01/2015   Preventative health care 02/12/2015   ADD (attention deficit disorder) 03/18/2013   Cerumen impaction 01/16/2013   Left groin pain 09/08/2012   Pharyngitis 07/16/2012   Urinary frequency 12/08/2011   Rectal bleed 12/08/2011   Diarrhea 09/23/2011    Inguinal lymphadenopathy 04/28/2011   Hip pain, bilateral 10/12/2010   COUGH 01/25/2010   CHEST PAIN UNSPECIFIED 01/25/2010   POSTURAL HYPOTENSION 08/27/2009   NIGHT SWEATS 08/27/2009   TREMOR 04/06/2009   ALLERGIC RHINITIS, SEASONAL 02/10/2009   FATIGUE 02/10/2009   BENIGN PROSTATIC HYPERTROPHY, HX OF 12/04/2008   PURE HYPERCHOLESTEROLEMIA 11/17/2008   Essential hypertension 03/31/2008   HIP PAIN, LEFT 10/08/2007   ERECTILE DYSFUNCTION 08/03/2007   OTHER ABNORMAL BLOOD CHEMISTRY 05/21/2007   DEPRESSION 02/22/2007   Hyperlipidemia 12/21/2006    Past Surgical History:  Procedure Laterality Date   APPENDECTOMY     CARPAL TUNNEL RELEASE     right   ELBOW SURGERY     right   INGUINAL HERNIA REPAIR     age 40       Home Medications    Prior to Admission medications   Medication Sig Start Date End Date Taking? Authorizing Provider  albuterol (PROVENTIL HFA;VENTOLIN HFA) 108 (90 Base) MCG/ACT inhaler Inhale 2 puffs into the lungs every 6 (six) hours as needed for wheezing. 06/23/16  Yes Newlin, Odette Horns, MD  amLODipine (NORVASC) 5 MG tablet Take 5 mg by mouth daily. 04/15/19  Yes [provider]  aspirin EC 81 MG tablet Take 81 mg by mouth daily.   Yes [provider]  atorvastatin (LIPITOR) 80 MG tablet Take 80 mg by mouth daily. 03/20/19  Yes [provider]  cholecalciferol (VITAMIN D3) 25 MCG (1000 UT) tablet Take 1,000 Units by mouth daily.  Yes [provider]  FLUoxetine (PROZAC) 40 MG capsule Take 1 capsule (40 mg total) by mouth daily. 06/13/19  Yes Donato Schultz, DO  lisinopril-hydrochlorothiazide (PRINZIDE,ZESTORETIC) 20-12.5 MG tablet Take 2 tablets by mouth daily. 02/23/16  Yes Seabron Spates R, DO  omeprazole (PRILOSEC) 20 MG capsule Take 20 mg by mouth daily as needed for indigestion. 07/18/18  Yes [provider]  sildenafil (VIAGRA) 100 MG tablet Take 0.5 tablets (50 mg total) by mouth daily as needed for  erectile dysfunction. 08/17/15  Yes Henrietta Hoover, NP  tiZANidine (ZANAFLEX) 4 MG tablet Take 1 tablet (4 mg total) by mouth every 6 (six) hours as needed for muscle spasms. 04/09/20  Yes Seabron Spates R, DO  vitamin C (ASCORBIC ACID) 500 MG tablet Take 500 mg by mouth daily.    [provider]  zinc gluconate 50 MG tablet Take 50 mg by mouth daily.    [provider]    Family History Family History  Problem Relation Age of Onset   Alzheimer's disease Paternal Uncle    Parkinsonism Father    Hyperlipidemia Father    Hypertension Father    Heart disease Father    Colon cancer Maternal Uncle    Heart disease Mother     Social History Social History   Tobacco Use   Smoking status: Never   Smokeless tobacco: Never  Vaping Use   Vaping status: Unknown  Substance Use Topics   Alcohol use: Yes    Comment: occ   Drug use: No     Allergies   Penicillins   Review of Systems Review of Systems Per HPI  Physical Exam Triage Vital Signs ED Triage Vitals  Encounter Vitals Group     BP 01/24/23 0852 (!) 164/68     Systolic BP Percentile --      Diastolic BP Percentile --      Pulse Rate 01/24/23 0852 (!) 50     Resp 01/24/23 0852 16     Temp 01/24/23 0852 98.3 F (36.8 C)     Temp Source 01/24/23 0852 Oral     SpO2 01/24/23 0852 96 %     Weight 01/24/23 0851 228 lb (103.4 kg)     Height 01/24/23 0851 5\' 10"  (1.778 m)     Head Circumference --      Peak Flow --      Pain Score 01/24/23 0851 0     Pain Loc --      Pain Education --      Exclude from Growth Chart --    No data found.  Updated Vital Signs BP (!) 164/68 (BP Location: Left Arm)   Pulse (!) 50   Temp 98.3 F (36.8 C) (Oral)   Resp 16   Ht 5\' 10"  (1.778 m)   Wt 228 lb (103.4 kg)   SpO2 96%   BMI 32.71 kg/m   Visual Acuity Right Eye Distance:   Left Eye Distance:   Bilateral Distance:    Right Eye Near:   Left Eye Near:    Bilateral Near:     Physical  Exam Vitals and nursing note reviewed.  Constitutional:      Appearance: He is not ill-appearing or toxic-appearing.  HENT:     Head: Normocephalic and atraumatic.     Right Ear: Hearing and external ear normal.     Left Ear: Hearing and external ear normal.     Nose: Nose normal.  Mouth/Throat:     Lips: Pink.  Eyes:     General: Lids are normal. Vision grossly intact. Gaze aligned appropriately.     Extraocular Movements: Extraocular movements intact.     Conjunctiva/sclera: Conjunctivae normal.  Pulmonary:     Effort: Pulmonary effort is normal.  Musculoskeletal:     Cervical back: Neck supple.  Skin:    General: Skin is warm and dry.     Capillary Refill: Capillary refill takes less than 2 seconds.     Findings: No rash.  Neurological:     General: No focal deficit present.     Mental Status: He is alert and oriented to person, place, and time. Mental status is at baseline.     Cranial Nerves: No dysarthria or facial asymmetry.  Psychiatric:        Mood and Affect: Mood normal.        Speech: Speech normal.        Behavior: Behavior normal.        Thought Content: Thought content normal.        Judgment: Judgment normal.      UC Treatments / Results  Labs (all labs ordered are listed, but only abnormal results are displayed) Labs Reviewed  HIV ANTIBODY (ROUTINE TESTING W REFLEX)  RPR  POCT URINALYSIS DIP (MANUAL ENTRY)  CYTOLOGY, (ORAL, ANAL, URETHRAL) ANCILLARY ONLY    EKG   Radiology No results found.  Procedures Procedures (including critical care time)  Medications Ordered in UC Medications - No data to display  Initial Impression / Assessment and Plan / UC Course  I have reviewed the triage vital signs and the nursing notes.  Pertinent labs & imaging results that were available during my care of the patient were reviewed by me and considered in my medical decision making (see chart for details).   1. At risk for STD due to unprotected sex,  urinary problem in male STI labs pending.  Patient would like HIV and syphilis testing today.  Will notify patient of positive results and treat accordingly when labs come back.  Urinalysis unremarkable for signs of urinary tract infection. Patient to avoid sexual intercourse until screening testing comes back.  Education provided regarding safe sexual practices and patient encouraged to use protection to prevent spread of STIs.   Counseled patient on potential for adverse effects with medications prescribed/recommended today, strict ER and return-to-clinic precautions discussed, patient verbalized understanding.    Final Clinical Impressions(s) / UC Diagnoses   Final diagnoses:  At risk for sexually transmitted infection due to unprotected sex  Urinary problem in male     Discharge Instructions      Your urinalysis is normal. No signs of UTI.   Your STD testing has been sent to the lab and will come back in the next 2 to 3 days.  We will call you if any of your results are positive requiring treatment and treat you at that time.   If you do not receive a phone call from [place], this means your testing was negative.  Avoid sexual intercourse until your STD results come back.  If any of your STD results are positive, you will need to avoid sexual intercourse for 7 days while you are being treated to prevent spread of STD.  Condom use is the best way to prevent spread of STDs.  Return to urgent care as needed.      ED Prescriptions   None    PDMP not reviewed this  encounter.   Reita May [place], [place] 01/24/23 [phone]

## 2023-01-24 NOTE — Discharge Instructions (Signed)
Your urinalysis is normal. No signs of UTI.   Your STD testing has been sent to the lab and will come back in the next 2 to 3 days.  We will call you if any of your results are positive requiring treatment and treat you at that time.   If you do not receive a phone call from [place], this means your testing was negative.  Avoid sexual intercourse until your STD results come back.  If any of your STD results are positive, you will need to avoid sexual intercourse for 7 days while you are being treated to prevent spread of STD.  Condom use is the best way to prevent spread of STDs.  Return to urgent care as needed.

## 2023-01-25 LAB — CYTOLOGY, (ORAL, ANAL, URETHRAL) ANCILLARY ONLY
Chlamydia: NEGATIVE
Comment: NEGATIVE
Comment: NEGATIVE
Comment: NORMAL
Neisseria Gonorrhea: NEGATIVE
Trichomonas: NEGATIVE
